# Patient Record
Sex: Female | Born: 1935 | Race: Black or African American | Hispanic: No | Marital: Single | Smoking: Former smoker
Health system: Southern US, Community
[De-identification: ages and names within clinical notes are randomized; demographics above are authoritative.]

## PROBLEM LIST (undated history)

## (undated) DIAGNOSIS — I10 Essential (primary) hypertension: Secondary | ICD-10-CM

## (undated) DIAGNOSIS — I714 Abdominal aortic aneurysm, without rupture: Secondary | ICD-10-CM

## (undated) DIAGNOSIS — E785 Hyperlipidemia, unspecified: Secondary | ICD-10-CM

## (undated) DIAGNOSIS — B029 Zoster without complications: Secondary | ICD-10-CM

## (undated) DIAGNOSIS — C20 Malignant neoplasm of rectum: Secondary | ICD-10-CM

## (undated) DIAGNOSIS — D649 Anemia, unspecified: Secondary | ICD-10-CM

## (undated) DIAGNOSIS — E039 Hypothyroidism, unspecified: Secondary | ICD-10-CM

## (undated) DIAGNOSIS — Z8739 Personal history of other diseases of the musculoskeletal system and connective tissue: Secondary | ICD-10-CM

## (undated) DIAGNOSIS — N281 Cyst of kidney, acquired: Secondary | ICD-10-CM

## (undated) DIAGNOSIS — C801 Malignant (primary) neoplasm, unspecified: Secondary | ICD-10-CM

## (undated) HISTORY — DX: Anemia, unspecified: D64.9

## (undated) HISTORY — DX: Zoster without complications: B02.9

## (undated) HISTORY — DX: Essential (primary) hypertension: I10

## (undated) HISTORY — DX: Abdominal aortic aneurysm, without rupture: I71.4

## (undated) HISTORY — PX: BUNIONECTOMY: SHX129

## (undated) HISTORY — DX: Hyperlipidemia, unspecified: E78.5

## (undated) HISTORY — PX: ABDOMINAL AORTIC ANEURYSM REPAIR: SUR1152

## (undated) HISTORY — DX: Malignant (primary) neoplasm, unspecified: C80.1

---
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
## 1959-04-05 HISTORY — PX: ABDOMINAL HYSTERECTOMY: SHX81

## 1997-08-26 ENCOUNTER — Other Ambulatory Visit: Admission: RE | Admit: 1997-08-26 | Discharge: 1997-08-26 | Payer: Self-pay | Admitting: Internal Medicine

## 1998-12-30 ENCOUNTER — Ambulatory Visit (HOSPITAL_COMMUNITY): Admission: RE | Admit: 1998-12-30 | Discharge: 1998-12-30 | Payer: Self-pay | Admitting: Internal Medicine

## 1998-12-30 ENCOUNTER — Encounter: Payer: Self-pay | Admitting: Internal Medicine

## 1999-03-31 ENCOUNTER — Encounter: Admission: RE | Admit: 1999-03-31 | Discharge: 1999-03-31 | Payer: Self-pay | Admitting: Internal Medicine

## 1999-03-31 ENCOUNTER — Encounter: Payer: Self-pay | Admitting: Internal Medicine

## 2000-04-14 ENCOUNTER — Encounter: Payer: Self-pay | Admitting: Internal Medicine

## 2000-04-14 ENCOUNTER — Encounter: Admission: RE | Admit: 2000-04-14 | Discharge: 2000-04-14 | Payer: Self-pay | Admitting: Internal Medicine

## 2001-04-18 ENCOUNTER — Encounter: Payer: Self-pay | Admitting: Internal Medicine

## 2001-04-18 ENCOUNTER — Encounter: Admission: RE | Admit: 2001-04-18 | Discharge: 2001-04-18 | Payer: Self-pay | Admitting: Internal Medicine

## 2001-07-05 ENCOUNTER — Ambulatory Visit (HOSPITAL_COMMUNITY): Admission: RE | Admit: 2001-07-05 | Discharge: 2001-07-05 | Payer: Self-pay | Admitting: Gastroenterology

## 2001-07-05 ENCOUNTER — Encounter (INDEPENDENT_AMBULATORY_CARE_PROVIDER_SITE_OTHER): Payer: Self-pay | Admitting: Specialist

## 2002-05-01 ENCOUNTER — Encounter: Payer: Self-pay | Admitting: Internal Medicine

## 2002-05-01 ENCOUNTER — Encounter: Admission: RE | Admit: 2002-05-01 | Discharge: 2002-05-01 | Payer: Self-pay | Admitting: Internal Medicine

## 2003-07-08 ENCOUNTER — Encounter: Admission: RE | Admit: 2003-07-08 | Discharge: 2003-07-08 | Payer: Self-pay | Admitting: Internal Medicine

## 2004-06-28 ENCOUNTER — Ambulatory Visit: Admission: RE | Admit: 2004-06-28 | Discharge: 2004-06-28 | Payer: Self-pay | Admitting: Gastroenterology

## 2004-06-28 ENCOUNTER — Encounter (INDEPENDENT_AMBULATORY_CARE_PROVIDER_SITE_OTHER): Payer: Self-pay | Admitting: *Deleted

## 2004-08-09 ENCOUNTER — Ambulatory Visit (HOSPITAL_COMMUNITY): Admission: RE | Admit: 2004-08-09 | Discharge: 2004-08-09 | Payer: Self-pay | Admitting: Diagnostic Radiology

## 2004-11-12 ENCOUNTER — Encounter: Admission: RE | Admit: 2004-11-12 | Discharge: 2004-11-12 | Payer: Self-pay | Admitting: Internal Medicine

## 2005-11-22 ENCOUNTER — Encounter: Admission: RE | Admit: 2005-11-22 | Discharge: 2005-11-22 | Payer: Self-pay | Admitting: Internal Medicine

## 2006-04-04 HISTORY — PX: OTHER SURGICAL HISTORY: SHX169

## 2006-05-29 ENCOUNTER — Encounter (INDEPENDENT_AMBULATORY_CARE_PROVIDER_SITE_OTHER): Payer: Self-pay | Admitting: *Deleted

## 2006-05-30 ENCOUNTER — Ambulatory Visit (HOSPITAL_COMMUNITY): Admission: RE | Admit: 2006-05-30 | Discharge: 2006-05-30 | Payer: Self-pay | Admitting: General Surgery

## 2006-06-20 ENCOUNTER — Ambulatory Visit: Payer: Self-pay | Admitting: Hematology and Oncology

## 2006-07-05 LAB — COMPREHENSIVE METABOLIC PANEL
Alkaline Phosphatase: 57 U/L (ref 39–117)
CO2: 22 mEq/L (ref 19–32)
Creatinine, Ser: 1.71 mg/dL — ABNORMAL HIGH (ref 0.40–1.20)
Glucose, Bld: 146 mg/dL — ABNORMAL HIGH (ref 70–99)
Sodium: 140 mEq/L (ref 135–145)
Total Bilirubin: 0.3 mg/dL (ref 0.3–1.2)
Total Protein: 7.2 g/dL (ref 6.0–8.3)

## 2006-07-05 LAB — CBC WITH DIFFERENTIAL/PLATELET
BASO%: 0.5 % (ref 0.0–2.0)
Eosinophils Absolute: 0.1 10*3/uL (ref 0.0–0.5)
HCT: 30.6 % — ABNORMAL LOW (ref 34.8–46.6)
HGB: 10.3 g/dL — ABNORMAL LOW (ref 11.6–15.9)
MCHC: 33.6 g/dL (ref 32.0–36.0)
MONO#: 0.5 10*3/uL (ref 0.1–0.9)
NEUT#: 3.9 10*3/uL (ref 1.5–6.5)
NEUT%: 67 % (ref 39.6–76.8)
WBC: 5.9 10*3/uL (ref 3.9–10.0)
lymph#: 1.2 10*3/uL (ref 0.9–3.3)

## 2006-07-06 ENCOUNTER — Ambulatory Visit (HOSPITAL_COMMUNITY): Admission: RE | Admit: 2006-07-06 | Discharge: 2006-07-06 | Payer: Self-pay | Admitting: Hematology and Oncology

## 2006-07-10 ENCOUNTER — Ambulatory Visit: Admission: RE | Admit: 2006-07-10 | Discharge: 2006-09-15 | Payer: Self-pay | Admitting: Radiation Oncology

## 2006-07-13 ENCOUNTER — Ambulatory Visit (HOSPITAL_COMMUNITY): Admission: RE | Admit: 2006-07-13 | Discharge: 2006-07-13 | Payer: Self-pay | Admitting: Hematology and Oncology

## 2006-07-19 LAB — COMPREHENSIVE METABOLIC PANEL
Alkaline Phosphatase: 60 U/L (ref 39–117)
BUN: 23 mg/dL (ref 6–23)
CO2: 24 mEq/L (ref 19–32)
Creatinine, Ser: 1.56 mg/dL — ABNORMAL HIGH (ref 0.40–1.20)
Glucose, Bld: 113 mg/dL — ABNORMAL HIGH (ref 70–99)
Sodium: 138 mEq/L (ref 135–145)
Total Bilirubin: 0.4 mg/dL (ref 0.3–1.2)
Total Protein: 7.4 g/dL (ref 6.0–8.3)

## 2006-07-19 LAB — CBC WITH DIFFERENTIAL/PLATELET
Basophils Absolute: 0 10*3/uL (ref 0.0–0.1)
Eosinophils Absolute: 0.1 10*3/uL (ref 0.0–0.5)
HCT: 31.1 % — ABNORMAL LOW (ref 34.8–46.6)
HGB: 10.4 g/dL — ABNORMAL LOW (ref 11.6–15.9)
LYMPH%: 12.7 % — ABNORMAL LOW (ref 14.0–48.0)
MCV: 77.7 fL — ABNORMAL LOW (ref 81.0–101.0)
MONO#: 0.8 10*3/uL (ref 0.1–0.9)
MONO%: 7.7 % (ref 0.0–13.0)
NEUT#: 8.6 10*3/uL — ABNORMAL HIGH (ref 1.5–6.5)
NEUT%: 78.3 % — ABNORMAL HIGH (ref 39.6–76.8)
Platelets: 516 10*3/uL — ABNORMAL HIGH (ref 145–400)
RBC: 4 10*6/uL (ref 3.70–5.32)
WBC: 11 10*3/uL — ABNORMAL HIGH (ref 3.9–10.0)

## 2006-07-20 ENCOUNTER — Ambulatory Visit (HOSPITAL_COMMUNITY): Admission: RE | Admit: 2006-07-20 | Discharge: 2006-07-20 | Payer: Self-pay | Admitting: Gastroenterology

## 2006-07-25 ENCOUNTER — Ambulatory Visit: Payer: Self-pay | Admitting: Gastroenterology

## 2006-07-25 LAB — CBC WITH DIFFERENTIAL/PLATELET
Basophils Absolute: 0 10*3/uL (ref 0.0–0.1)
Eosinophils Absolute: 0.1 10*3/uL (ref 0.0–0.5)
HGB: 9.9 g/dL — ABNORMAL LOW (ref 11.6–15.9)
LYMPH%: 17.8 % (ref 14.0–48.0)
MCV: 77.5 fL — ABNORMAL LOW (ref 81.0–101.0)
MONO#: 0.4 10*3/uL (ref 0.1–0.9)
NEUT#: 3.9 10*3/uL (ref 1.5–6.5)
Platelets: 601 10*3/uL — ABNORMAL HIGH (ref 145–400)
RBC: 3.8 10*6/uL (ref 3.70–5.32)
WBC: 5.3 10*3/uL (ref 3.9–10.0)

## 2006-08-01 ENCOUNTER — Ambulatory Visit: Payer: Self-pay | Admitting: Hematology and Oncology

## 2006-08-02 ENCOUNTER — Encounter: Admission: RE | Admit: 2006-08-02 | Discharge: 2006-08-02 | Payer: Self-pay | Admitting: Interventional Radiology

## 2006-08-03 LAB — CBC WITH DIFFERENTIAL/PLATELET
Eosinophils Absolute: 0.2 10*3/uL (ref 0.0–0.5)
HCT: 28.5 % — ABNORMAL LOW (ref 34.8–46.6)
LYMPH%: 11.5 % — ABNORMAL LOW (ref 14.0–48.0)
MCV: 78.1 fL — ABNORMAL LOW (ref 81.0–101.0)
MONO#: 0.4 10*3/uL (ref 0.1–0.9)
MONO%: 9.5 % (ref 0.0–13.0)
NEUT#: 2.9 10*3/uL (ref 1.5–6.5)
NEUT%: 74.4 % (ref 39.6–76.8)
Platelets: 432 10*3/uL — ABNORMAL HIGH (ref 145–400)
WBC: 3.9 10*3/uL (ref 3.9–10.0)

## 2006-08-07 LAB — CBC WITH DIFFERENTIAL/PLATELET
BASO%: 0.1 % (ref 0.0–2.0)
EOS%: 5.6 % (ref 0.0–7.0)
LYMPH%: 8.7 % — ABNORMAL LOW (ref 14.0–48.0)
MCH: 27 pg (ref 26.0–34.0)
MCHC: 34.2 g/dL (ref 32.0–36.0)
MCV: 79.1 fL — ABNORMAL LOW (ref 81.0–101.0)
MONO%: 8 % (ref 0.0–13.0)
NEUT#: 3.5 10*3/uL (ref 1.5–6.5)
Platelets: 347 10*3/uL (ref 145–400)
RBC: 3.45 10*6/uL — ABNORMAL LOW (ref 3.70–5.32)
RDW: 16.2 % — ABNORMAL HIGH (ref 11.3–14.5)

## 2006-08-07 LAB — COMPREHENSIVE METABOLIC PANEL
AST: 23 U/L (ref 0–37)
Albumin: 3.6 g/dL (ref 3.5–5.2)
Alkaline Phosphatase: 45 U/L (ref 39–117)
Potassium: 4 mEq/L (ref 3.5–5.3)
Sodium: 138 mEq/L (ref 135–145)
Total Bilirubin: 0.3 mg/dL (ref 0.3–1.2)
Total Protein: 6.5 g/dL (ref 6.0–8.3)

## 2006-08-07 LAB — IRON AND TIBC
%SAT: 15 % — ABNORMAL LOW (ref 20–55)
Iron: 59 ug/dL (ref 42–145)

## 2006-08-07 LAB — FERRITIN: Ferritin: 18 ng/mL (ref 10–291)

## 2006-08-14 LAB — CBC WITH DIFFERENTIAL/PLATELET
BASO%: 0 % (ref 0.0–2.0)
EOS%: 4.5 % (ref 0.0–7.0)
HCT: 30.3 % — ABNORMAL LOW (ref 34.8–46.6)
HGB: 10.1 g/dL — ABNORMAL LOW (ref 11.6–15.9)
MCH: 27.1 pg (ref 26.0–34.0)
MCHC: 33.3 g/dL (ref 32.0–36.0)
MONO#: 0.6 10*3/uL (ref 0.1–0.9)
NEUT%: 84 % — ABNORMAL HIGH (ref 39.6–76.8)
RDW: 17.3 % — ABNORMAL HIGH (ref 11.3–14.5)
WBC: 8.6 10*3/uL (ref 3.9–10.0)
lymph#: 0.4 10*3/uL — ABNORMAL LOW (ref 0.9–3.3)

## 2006-08-14 LAB — COMPREHENSIVE METABOLIC PANEL
AST: 16 U/L (ref 0–37)
Albumin: 3.9 g/dL (ref 3.5–5.2)
Alkaline Phosphatase: 49 U/L (ref 39–117)
BUN: 20 mg/dL (ref 6–23)
Potassium: 3.9 mEq/L (ref 3.5–5.3)
Sodium: 136 mEq/L (ref 135–145)
Total Bilirubin: 0.4 mg/dL (ref 0.3–1.2)
Total Protein: 6.8 g/dL (ref 6.0–8.3)

## 2006-08-21 LAB — CBC WITH DIFFERENTIAL/PLATELET
Eosinophils Absolute: 0.4 10*3/uL (ref 0.0–0.5)
HCT: 31.4 % — ABNORMAL LOW (ref 34.8–46.6)
LYMPH%: 6.7 % — ABNORMAL LOW (ref 14.0–48.0)
MCV: 83.8 fL (ref 81.0–101.0)
MONO%: 9.1 % (ref 0.0–13.0)
NEUT#: 3.9 10*3/uL (ref 1.5–6.5)
NEUT%: 75 % (ref 39.6–76.8)
Platelets: 293 10*3/uL (ref 145–400)
RBC: 3.74 10*6/uL (ref 3.70–5.32)

## 2006-08-21 LAB — COMPREHENSIVE METABOLIC PANEL
Alkaline Phosphatase: 52 U/L (ref 39–117)
BUN: 22 mg/dL (ref 6–23)
CO2: 23 mEq/L (ref 19–32)
Creatinine, Ser: 1.69 mg/dL — ABNORMAL HIGH (ref 0.40–1.20)
Glucose, Bld: 123 mg/dL — ABNORMAL HIGH (ref 70–99)
Sodium: 138 mEq/L (ref 135–145)
Total Bilirubin: 0.4 mg/dL (ref 0.3–1.2)
Total Protein: 6.3 g/dL (ref 6.0–8.3)

## 2006-08-31 LAB — CBC WITH DIFFERENTIAL/PLATELET
Basophils Absolute: 0 10*3/uL (ref 0.0–0.1)
Eosinophils Absolute: 0.1 10*3/uL (ref 0.0–0.5)
HCT: 28.7 % — ABNORMAL LOW (ref 34.8–46.6)
HGB: 10 g/dL — ABNORMAL LOW (ref 11.6–15.9)
LYMPH%: 8.7 % — ABNORMAL LOW (ref 14.0–48.0)
MCV: 84.1 fL (ref 81.0–101.0)
MONO#: 0.5 10*3/uL (ref 0.1–0.9)
MONO%: 12.1 % (ref 0.0–13.0)
NEUT#: 2.9 10*3/uL (ref 1.5–6.5)
NEUT%: 76.2 % (ref 39.6–76.8)
Platelets: 379 10*3/uL (ref 145–400)
WBC: 3.8 10*3/uL — ABNORMAL LOW (ref 3.9–10.0)

## 2006-09-06 LAB — CBC WITH DIFFERENTIAL/PLATELET
BASO%: 0.2 % (ref 0.0–2.0)
EOS%: 3.7 % (ref 0.0–7.0)
HCT: 30.5 % — ABNORMAL LOW (ref 34.8–46.6)
LYMPH%: 9.3 % — ABNORMAL LOW (ref 14.0–48.0)
MCH: 29.8 pg (ref 26.0–34.0)
MCHC: 34.8 g/dL (ref 32.0–36.0)
MONO%: 14 % — ABNORMAL HIGH (ref 0.0–13.0)
NEUT%: 72.8 % (ref 39.6–76.8)
Platelets: 339 10*3/uL (ref 145–400)
RBC: 3.57 10*6/uL — ABNORMAL LOW (ref 3.70–5.32)

## 2006-09-06 LAB — COMPREHENSIVE METABOLIC PANEL
ALT: 12 U/L (ref 0–35)
AST: 20 U/L (ref 0–37)
Alkaline Phosphatase: 55 U/L (ref 39–117)
Creatinine, Ser: 1.6 mg/dL — ABNORMAL HIGH (ref 0.40–1.20)
Sodium: 140 mEq/L (ref 135–145)
Total Bilirubin: 0.4 mg/dL (ref 0.3–1.2)

## 2006-09-06 LAB — LACTATE DEHYDROGENASE: LDH: 292 U/L — ABNORMAL HIGH (ref 94–250)

## 2006-10-24 ENCOUNTER — Ambulatory Visit: Payer: Self-pay | Admitting: Hematology and Oncology

## 2006-10-26 LAB — COMPREHENSIVE METABOLIC PANEL
Albumin: 4 g/dL (ref 3.5–5.2)
Alkaline Phosphatase: 54 U/L (ref 39–117)
BUN: 24 mg/dL — ABNORMAL HIGH (ref 6–23)
Calcium: 8.8 mg/dL (ref 8.4–10.5)
Chloride: 104 mEq/L (ref 96–112)
Glucose, Bld: 84 mg/dL (ref 70–99)
Potassium: 4.5 mEq/L (ref 3.5–5.3)

## 2006-10-26 LAB — CBC WITH DIFFERENTIAL/PLATELET
Basophils Absolute: 0 10*3/uL (ref 0.0–0.1)
Eosinophils Absolute: 0.2 10*3/uL (ref 0.0–0.5)
HCT: 37.2 % (ref 34.8–46.6)
HGB: 12.9 g/dL (ref 11.6–15.9)
MCV: 89.5 fL (ref 81.0–101.0)
MONO%: 10.6 % (ref 0.0–13.0)
NEUT#: 4.4 10*3/uL (ref 1.5–6.5)
NEUT%: 72.2 % (ref 39.6–76.8)
RDW: 21.8 % — ABNORMAL HIGH (ref 11.3–14.5)

## 2006-10-26 LAB — CEA: CEA: 2.9 ng/mL (ref 0.0–5.0)

## 2006-10-26 LAB — LACTATE DEHYDROGENASE: LDH: 248 U/L (ref 94–250)

## 2006-11-06 ENCOUNTER — Ambulatory Visit (HOSPITAL_COMMUNITY): Admission: RE | Admit: 2006-11-06 | Discharge: 2006-11-06 | Payer: Self-pay | Admitting: Oncology

## 2006-11-27 ENCOUNTER — Encounter: Admission: RE | Admit: 2006-11-27 | Discharge: 2006-11-27 | Payer: Self-pay | Admitting: Hematology and Oncology

## 2006-12-08 ENCOUNTER — Ambulatory Visit: Payer: Self-pay | Admitting: Hematology and Oncology

## 2006-12-12 LAB — COMPREHENSIVE METABOLIC PANEL
AST: 22 U/L (ref 0–37)
Albumin: 4.1 g/dL (ref 3.5–5.2)
BUN: 26 mg/dL — ABNORMAL HIGH (ref 6–23)
Calcium: 8.8 mg/dL (ref 8.4–10.5)
Chloride: 102 mEq/L (ref 96–112)
Potassium: 4.1 mEq/L (ref 3.5–5.3)
Sodium: 139 mEq/L (ref 135–145)
Total Protein: 6.9 g/dL (ref 6.0–8.3)

## 2006-12-12 LAB — CBC WITH DIFFERENTIAL/PLATELET
Basophils Absolute: 0 10*3/uL (ref 0.0–0.1)
EOS%: 3 % (ref 0.0–7.0)
Eosinophils Absolute: 0.2 10*3/uL (ref 0.0–0.5)
HGB: 12.4 g/dL (ref 11.6–15.9)
NEUT#: 3.9 10*3/uL (ref 1.5–6.5)
RBC: 3.86 10*6/uL (ref 3.70–5.32)
RDW: 15.7 % — ABNORMAL HIGH (ref 11.3–14.5)
lymph#: 0.7 10*3/uL — ABNORMAL LOW (ref 0.9–3.3)

## 2007-01-03 LAB — CBC WITH DIFFERENTIAL/PLATELET
BASO%: 0.4 % (ref 0.0–2.0)
Basophils Absolute: 0 10*3/uL (ref 0.0–0.1)
EOS%: 4.9 % (ref 0.0–7.0)
HGB: 12.2 g/dL (ref 11.6–15.9)
MCH: 32.5 pg (ref 26.0–34.0)
MCHC: 35.6 g/dL (ref 32.0–36.0)
MCV: 91.1 fL (ref 81.0–101.0)
MONO%: 9.5 % (ref 0.0–13.0)
RBC: 3.76 10*6/uL (ref 3.70–5.32)
RDW: 17.6 % — ABNORMAL HIGH (ref 11.3–14.5)
lymph#: 0.6 10*3/uL — ABNORMAL LOW (ref 0.9–3.3)

## 2007-01-03 LAB — BASIC METABOLIC PANEL
CO2: 21 mEq/L (ref 19–32)
Chloride: 102 mEq/L (ref 96–112)
Creatinine, Ser: 1.76 mg/dL — ABNORMAL HIGH (ref 0.40–1.20)
Potassium: 3.7 mEq/L (ref 3.5–5.3)
Sodium: 137 mEq/L (ref 135–145)

## 2007-01-22 ENCOUNTER — Ambulatory Visit: Payer: Self-pay | Admitting: Hematology and Oncology

## 2007-01-24 LAB — CBC WITH DIFFERENTIAL/PLATELET
Basophils Absolute: 0 10*3/uL (ref 0.0–0.1)
EOS%: 4.5 % (ref 0.0–7.0)
Eosinophils Absolute: 0.2 10*3/uL (ref 0.0–0.5)
HCT: 31.9 % — ABNORMAL LOW (ref 34.8–46.6)
HGB: 11.4 g/dL — ABNORMAL LOW (ref 11.6–15.9)
MCH: 32.7 pg (ref 26.0–34.0)
MCV: 91.9 fL (ref 81.0–101.0)
MONO%: 14.1 % — ABNORMAL HIGH (ref 0.0–13.0)
NEUT#: 4 10*3/uL (ref 1.5–6.5)
NEUT%: 73.1 % (ref 39.6–76.8)
RDW: 21.2 % — ABNORMAL HIGH (ref 11.3–14.5)

## 2007-01-24 LAB — BASIC METABOLIC PANEL
BUN: 21 mg/dL (ref 6–23)
Chloride: 104 mEq/L (ref 96–112)
Creatinine, Ser: 1.7 mg/dL — ABNORMAL HIGH (ref 0.40–1.20)
Glucose, Bld: 129 mg/dL — ABNORMAL HIGH (ref 70–99)
Potassium: 3.4 mEq/L — ABNORMAL LOW (ref 3.5–5.3)

## 2007-02-07 LAB — CBC WITH DIFFERENTIAL/PLATELET
BASO%: 0.7 % (ref 0.0–2.0)
EOS%: 3.4 % (ref 0.0–7.0)
LYMPH%: 18.5 % (ref 14.0–48.0)
MCHC: 35.4 g/dL (ref 32.0–36.0)
MCV: 94.3 fL (ref 81.0–101.0)
MONO%: 13.7 % — ABNORMAL HIGH (ref 0.0–13.0)
Platelets: 356 10*3/uL (ref 145–400)
RBC: 3.43 10*6/uL — ABNORMAL LOW (ref 3.70–5.32)
RDW: 20.4 % — ABNORMAL HIGH (ref 11.3–14.5)
WBC: 3.8 10*3/uL — ABNORMAL LOW (ref 3.9–10.0)

## 2007-02-07 LAB — COMPREHENSIVE METABOLIC PANEL
ALT: 14 U/L (ref 0–35)
AST: 21 U/L (ref 0–37)
Alkaline Phosphatase: 62 U/L (ref 39–117)
Potassium: 4.3 mEq/L (ref 3.5–5.3)
Sodium: 139 mEq/L (ref 135–145)
Total Bilirubin: 0.6 mg/dL (ref 0.3–1.2)
Total Protein: 6.5 g/dL (ref 6.0–8.3)

## 2007-03-06 ENCOUNTER — Ambulatory Visit: Payer: Self-pay | Admitting: Hematology and Oncology

## 2007-03-06 LAB — CBC WITH DIFFERENTIAL/PLATELET
EOS%: 3.6 % (ref 0.0–7.0)
Eosinophils Absolute: 0.2 10*3/uL (ref 0.0–0.5)
LYMPH%: 11.8 % — ABNORMAL LOW (ref 14.0–48.0)
MCH: 33.5 pg (ref 26.0–34.0)
MCHC: 36.1 g/dL — ABNORMAL HIGH (ref 32.0–36.0)
MCV: 92.9 fL (ref 81.0–101.0)
MONO%: 9.1 % (ref 0.0–13.0)
NEUT#: 4.1 10*3/uL (ref 1.5–6.5)
Platelets: 398 10*3/uL (ref 145–400)
RBC: 3.47 10*6/uL — ABNORMAL LOW (ref 3.70–5.32)

## 2007-03-06 LAB — CEA: CEA: 2.3 ng/mL (ref 0.0–5.0)

## 2007-03-06 LAB — COMPREHENSIVE METABOLIC PANEL
ALT: 15 U/L (ref 0–35)
BUN: 32 mg/dL — ABNORMAL HIGH (ref 6–23)
CO2: 22 mEq/L (ref 19–32)
Creatinine, Ser: 1.64 mg/dL — ABNORMAL HIGH (ref 0.40–1.20)
Total Bilirubin: 0.4 mg/dL (ref 0.3–1.2)

## 2007-03-13 ENCOUNTER — Ambulatory Visit (HOSPITAL_COMMUNITY): Admission: RE | Admit: 2007-03-13 | Discharge: 2007-03-13 | Payer: Self-pay | Admitting: Hematology and Oncology

## 2007-05-24 ENCOUNTER — Ambulatory Visit: Payer: Self-pay | Admitting: Hematology and Oncology

## 2007-07-10 ENCOUNTER — Ambulatory Visit: Payer: Self-pay | Admitting: Hematology and Oncology

## 2007-07-12 LAB — CBC WITH DIFFERENTIAL/PLATELET
BASO%: 0.3 % (ref 0.0–2.0)
EOS%: 0.9 % (ref 0.0–7.0)
Eosinophils Absolute: 0.1 10*3/uL (ref 0.0–0.5)
LYMPH%: 13.6 % — ABNORMAL LOW (ref 14.0–48.0)
MCH: 31.1 pg (ref 26.0–34.0)
MCHC: 34.8 g/dL (ref 32.0–36.0)
MCV: 89.2 fL (ref 81.0–101.0)
MONO%: 10.6 % (ref 0.0–13.0)
Platelets: 441 10*3/uL — ABNORMAL HIGH (ref 145–400)
RBC: 4.16 10*6/uL (ref 3.70–5.32)

## 2007-07-12 LAB — COMPREHENSIVE METABOLIC PANEL
ALT: 15 U/L (ref 0–35)
AST: 21 U/L (ref 0–37)
Alkaline Phosphatase: 42 U/L (ref 39–117)
Creatinine, Ser: 1.85 mg/dL — ABNORMAL HIGH (ref 0.40–1.20)
Sodium: 138 mEq/L (ref 135–145)
Total Bilirubin: 0.4 mg/dL (ref 0.3–1.2)
Total Protein: 7.1 g/dL (ref 6.0–8.3)

## 2007-07-16 ENCOUNTER — Ambulatory Visit (HOSPITAL_COMMUNITY): Admission: RE | Admit: 2007-07-16 | Discharge: 2007-07-16 | Payer: Self-pay | Admitting: Hematology and Oncology

## 2007-07-23 LAB — PROTEIN ELECTROPHORESIS, SERUM, WITH REFLEX
Albumin ELP: 54.7 % — ABNORMAL LOW (ref 55.8–66.1)
Alpha-1-Globulin: 5.3 % — ABNORMAL HIGH (ref 2.9–4.9)
Alpha-2-Globulin: 13.8 % — ABNORMAL HIGH (ref 7.1–11.8)
Beta 2: 5.2 % (ref 3.2–6.5)
Beta Globulin: 6.4 % (ref 4.7–7.2)
Gamma Globulin: 14.6 % (ref 11.1–18.8)

## 2007-07-23 LAB — IGG, IGA, IGM: IgM, Serum: 364 mg/dL — ABNORMAL HIGH (ref 60–263)

## 2007-07-24 ENCOUNTER — Ambulatory Visit (HOSPITAL_COMMUNITY): Admission: RE | Admit: 2007-07-24 | Discharge: 2007-07-24 | Payer: Self-pay | Admitting: Hematology and Oncology

## 2007-07-30 ENCOUNTER — Ambulatory Visit: Payer: Self-pay | Admitting: Vascular Surgery

## 2007-08-09 ENCOUNTER — Ambulatory Visit: Payer: Self-pay | Admitting: Cardiothoracic Surgery

## 2007-08-29 ENCOUNTER — Ambulatory Visit: Payer: Self-pay | Admitting: Hematology and Oncology

## 2007-10-11 ENCOUNTER — Ambulatory Visit (HOSPITAL_COMMUNITY): Admission: RE | Admit: 2007-10-11 | Discharge: 2007-10-11 | Payer: Self-pay | Admitting: Hematology and Oncology

## 2007-10-11 LAB — COMPREHENSIVE METABOLIC PANEL
AST: 27 U/L (ref 0–37)
Albumin: 3.7 g/dL (ref 3.5–5.2)
Alkaline Phosphatase: 50 U/L (ref 39–117)
Calcium: 9.1 mg/dL (ref 8.4–10.5)
Chloride: 103 mEq/L (ref 96–112)
Potassium: 4.3 mEq/L (ref 3.5–5.3)
Sodium: 138 mEq/L (ref 135–145)
Total Protein: 7.1 g/dL (ref 6.0–8.3)

## 2007-10-11 LAB — CBC WITH DIFFERENTIAL/PLATELET
EOS%: 3.7 % (ref 0.0–7.0)
Eosinophils Absolute: 0.2 10*3/uL (ref 0.0–0.5)
HGB: 12 g/dL (ref 11.6–15.9)
MCH: 31 pg (ref 26.0–34.0)
MCV: 89.3 fL (ref 81.0–101.0)
MONO%: 11.1 % (ref 0.0–13.0)
NEUT#: 3.3 10*3/uL (ref 1.5–6.5)
RBC: 3.88 10*6/uL (ref 3.70–5.32)
RDW: 14.4 % (ref 11.3–14.5)
lymph#: 0.7 10*3/uL — ABNORMAL LOW (ref 0.9–3.3)

## 2007-10-16 ENCOUNTER — Ambulatory Visit: Payer: Self-pay | Admitting: Hematology and Oncology

## 2007-11-27 ENCOUNTER — Ambulatory Visit: Payer: Self-pay | Admitting: Hematology and Oncology

## 2007-12-17 ENCOUNTER — Encounter: Admission: RE | Admit: 2007-12-17 | Discharge: 2007-12-17 | Payer: Self-pay | Admitting: Internal Medicine

## 2008-01-09 LAB — CBC WITH DIFFERENTIAL/PLATELET
Basophils Absolute: 0 10*3/uL (ref 0.0–0.1)
HCT: 33.7 % — ABNORMAL LOW (ref 34.8–46.6)
HGB: 11.6 g/dL (ref 11.6–15.9)
MCH: 30.1 pg (ref 26.0–34.0)
MONO#: 0.4 10*3/uL (ref 0.1–0.9)
NEUT%: 77.5 % — ABNORMAL HIGH (ref 39.6–76.8)
WBC: 4.7 10*3/uL (ref 3.9–10.0)
lymph#: 0.5 10*3/uL — ABNORMAL LOW (ref 0.9–3.3)

## 2008-01-09 LAB — COMPREHENSIVE METABOLIC PANEL
BUN: 21 mg/dL (ref 6–23)
CO2: 20 mEq/L (ref 19–32)
Calcium: 9.3 mg/dL (ref 8.4–10.5)
Chloride: 104 mEq/L (ref 96–112)
Creatinine, Ser: 1.75 mg/dL — ABNORMAL HIGH (ref 0.40–1.20)
Glucose, Bld: 154 mg/dL — ABNORMAL HIGH (ref 70–99)

## 2008-01-09 LAB — CEA: CEA: 1.9 ng/mL (ref 0.0–5.0)

## 2008-01-11 ENCOUNTER — Ambulatory Visit (HOSPITAL_COMMUNITY): Admission: RE | Admit: 2008-01-11 | Discharge: 2008-01-11 | Payer: Self-pay | Admitting: Hematology and Oncology

## 2008-01-14 ENCOUNTER — Ambulatory Visit: Payer: Self-pay | Admitting: Hematology and Oncology

## 2008-01-29 ENCOUNTER — Ambulatory Visit: Payer: Self-pay | Admitting: Vascular Surgery

## 2008-03-03 ENCOUNTER — Ambulatory Visit: Payer: Self-pay | Admitting: Hematology and Oncology

## 2008-05-16 ENCOUNTER — Ambulatory Visit: Payer: Self-pay | Admitting: Hematology and Oncology

## 2008-05-20 LAB — CBC WITH DIFFERENTIAL/PLATELET
BASO%: 0.4 % (ref 0.0–2.0)
Eosinophils Absolute: 0.2 10*3/uL (ref 0.0–0.5)
LYMPH%: 14.1 % (ref 14.0–48.0)
MCHC: 33.4 g/dL (ref 32.0–36.0)
MONO#: 0.5 10*3/uL (ref 0.1–0.9)
MONO%: 10.1 % (ref 0.0–13.0)
NEUT#: 3.6 10*3/uL (ref 1.5–6.5)
Platelets: 565 10*3/uL — ABNORMAL HIGH (ref 145–400)
RBC: 3.28 10*6/uL — ABNORMAL LOW (ref 3.70–5.32)
RDW: 16.7 % — ABNORMAL HIGH (ref 11.3–14.5)
WBC: 5.1 10*3/uL (ref 3.9–10.0)

## 2008-05-20 LAB — COMPREHENSIVE METABOLIC PANEL
ALT: 12 U/L (ref 0–35)
AST: 18 U/L (ref 0–37)
Calcium: 10 mg/dL (ref 8.4–10.5)
Chloride: 98 mEq/L (ref 96–112)
Creatinine, Ser: 2.08 mg/dL — ABNORMAL HIGH (ref 0.40–1.20)
Total Bilirubin: 0.2 mg/dL — ABNORMAL LOW (ref 0.3–1.2)

## 2008-05-26 LAB — IGG, IGA, IGM: IgM, Serum: 341 mg/dL — ABNORMAL HIGH (ref 60–263)

## 2008-05-26 LAB — IRON AND TIBC
TIBC: 361 ug/dL (ref 250–470)
UIBC: 327 ug/dL

## 2008-05-26 LAB — PROTEIN ELECTROPHORESIS, SERUM, WITH REFLEX
Albumin ELP: 51.5 % — ABNORMAL LOW (ref 55.8–66.1)
Alpha-1-Globulin: 6.1 % — ABNORMAL HIGH (ref 2.9–4.9)
Gamma Globulin: 16.9 % (ref 11.1–18.8)
Total Protein, Serum Electrophoresis: 7.6 g/dL (ref 6.0–8.3)

## 2008-05-26 LAB — IFE INTERPRETATION

## 2008-05-26 LAB — FERRITIN: Ferritin: 24 ng/mL (ref 10–291)

## 2008-05-29 LAB — UIFE/LIGHT CHAINS/TP QN, 24-HR UR
Alpha 2, Urine: DETECTED — AB
Beta, Urine: DETECTED — AB
Free Lt Chn Excr Rate: 235.6 mg/d
Total Protein, Urine-Ur/day: 308 mg/d — ABNORMAL HIGH (ref 10–140)

## 2008-07-15 ENCOUNTER — Ambulatory Visit: Payer: Self-pay | Admitting: Hematology and Oncology

## 2008-09-09 ENCOUNTER — Ambulatory Visit: Payer: Self-pay | Admitting: Hematology and Oncology

## 2008-09-11 ENCOUNTER — Ambulatory Visit (HOSPITAL_COMMUNITY): Admission: RE | Admit: 2008-09-11 | Discharge: 2008-09-11 | Payer: Self-pay | Admitting: Hematology and Oncology

## 2008-09-11 LAB — COMPREHENSIVE METABOLIC PANEL
Albumin: 3.4 g/dL — ABNORMAL LOW (ref 3.5–5.2)
BUN: 22 mg/dL (ref 6–23)
CO2: 24 mEq/L (ref 19–32)
Calcium: 9 mg/dL (ref 8.4–10.5)
Chloride: 100 mEq/L (ref 96–112)
Glucose, Bld: 136 mg/dL — ABNORMAL HIGH (ref 70–99)
Potassium: 4.5 mEq/L (ref 3.5–5.3)
Sodium: 134 mEq/L — ABNORMAL LOW (ref 135–145)
Total Protein: 7.1 g/dL (ref 6.0–8.3)

## 2008-09-11 LAB — CBC WITH DIFFERENTIAL/PLATELET
Basophils Absolute: 0 10*3/uL (ref 0.0–0.1)
Eosinophils Absolute: 0.4 10*3/uL (ref 0.0–0.5)
HGB: 10.3 g/dL — ABNORMAL LOW (ref 11.6–15.9)
MCV: 86.4 fL (ref 79.5–101.0)
MONO#: 0.5 10*3/uL (ref 0.1–0.9)
NEUT#: 3 10*3/uL (ref 1.5–6.5)
RBC: 3.68 10*6/uL — ABNORMAL LOW (ref 3.70–5.45)
RDW: 16.8 % — ABNORMAL HIGH (ref 11.2–14.5)
WBC: 4.7 10*3/uL (ref 3.9–10.3)
lymph#: 0.8 10*3/uL — ABNORMAL LOW (ref 0.9–3.3)

## 2008-09-11 LAB — CEA: CEA: 2.1 ng/mL (ref 0.0–5.0)

## 2008-09-24 ENCOUNTER — Ambulatory Visit (HOSPITAL_COMMUNITY): Admission: RE | Admit: 2008-09-24 | Discharge: 2008-09-24 | Payer: Self-pay | Admitting: Hematology and Oncology

## 2008-10-28 ENCOUNTER — Ambulatory Visit: Payer: Self-pay | Admitting: Vascular Surgery

## 2008-11-24 ENCOUNTER — Ambulatory Visit (HOSPITAL_COMMUNITY): Admission: RE | Admit: 2008-11-24 | Discharge: 2008-11-24 | Payer: Self-pay | Admitting: Internal Medicine

## 2008-12-22 ENCOUNTER — Encounter: Admission: RE | Admit: 2008-12-22 | Discharge: 2008-12-22 | Payer: Self-pay | Admitting: Internal Medicine

## 2009-01-15 ENCOUNTER — Ambulatory Visit: Payer: Self-pay | Admitting: Hematology and Oncology

## 2009-01-19 LAB — CBC WITH DIFFERENTIAL/PLATELET
BASO%: 0.4 % (ref 0.0–2.0)
EOS%: 2.7 % (ref 0.0–7.0)
MCHC: 31.6 g/dL (ref 31.5–36.0)
MONO#: 0.5 10*3/uL (ref 0.1–0.9)
RBC: 3.41 10*6/uL — ABNORMAL LOW (ref 3.70–5.45)
RDW: 14.9 % — ABNORMAL HIGH (ref 11.2–14.5)
WBC: 7 10*3/uL (ref 3.9–10.3)
lymph#: 1 10*3/uL (ref 0.9–3.3)

## 2009-01-19 LAB — COMPREHENSIVE METABOLIC PANEL
AST: 25 U/L (ref 0–37)
BUN: 22 mg/dL (ref 6–23)
Calcium: 8.9 mg/dL (ref 8.4–10.5)
Chloride: 101 mEq/L (ref 96–112)
Creatinine, Ser: 1.79 mg/dL — ABNORMAL HIGH (ref 0.40–1.20)
Glucose, Bld: 154 mg/dL — ABNORMAL HIGH (ref 70–99)

## 2009-04-04 DIAGNOSIS — I714 Abdominal aortic aneurysm, without rupture, unspecified: Secondary | ICD-10-CM

## 2009-04-04 HISTORY — DX: Abdominal aortic aneurysm, without rupture, unspecified: I71.40

## 2009-04-04 HISTORY — DX: Abdominal aortic aneurysm, without rupture: I71.4

## 2009-04-04 HISTORY — PX: OTHER SURGICAL HISTORY: SHX169

## 2009-05-15 ENCOUNTER — Ambulatory Visit: Payer: Self-pay | Admitting: Hematology and Oncology

## 2009-05-19 ENCOUNTER — Ambulatory Visit (HOSPITAL_COMMUNITY): Admission: RE | Admit: 2009-05-19 | Discharge: 2009-05-19 | Payer: Self-pay | Admitting: Hematology and Oncology

## 2009-05-19 LAB — CBC WITH DIFFERENTIAL/PLATELET
BASO%: 0.2 % (ref 0.0–2.0)
EOS%: 3.6 % (ref 0.0–7.0)
HCT: 29 % — ABNORMAL LOW (ref 34.8–46.6)
LYMPH%: 10.6 % — ABNORMAL LOW (ref 14.0–49.7)
MCH: 28.9 pg (ref 25.1–34.0)
MCHC: 33 g/dL (ref 31.5–36.0)
MCV: 87.4 fL (ref 79.5–101.0)
MONO%: 8.4 % (ref 0.0–14.0)
NEUT%: 77.2 % — ABNORMAL HIGH (ref 38.4–76.8)
lymph#: 0.7 10*3/uL — ABNORMAL LOW (ref 0.9–3.3)

## 2009-05-19 LAB — COMPREHENSIVE METABOLIC PANEL
ALT: 15 U/L (ref 0–35)
AST: 25 U/L (ref 0–37)
Alkaline Phosphatase: 57 U/L (ref 39–117)
BUN: 27 mg/dL — ABNORMAL HIGH (ref 6–23)
Chloride: 102 mEq/L (ref 96–112)
Creatinine, Ser: 1.94 mg/dL — ABNORMAL HIGH (ref 0.40–1.20)
Total Bilirubin: 0.5 mg/dL (ref 0.3–1.2)

## 2009-05-23 LAB — IRON AND TIBC: %SAT: 7 % — ABNORMAL LOW (ref 20–55)

## 2009-06-17 ENCOUNTER — Ambulatory Visit: Payer: Self-pay | Admitting: Hematology and Oncology

## 2009-06-19 LAB — CBC WITH DIFFERENTIAL/PLATELET
BASO%: 0.3 % (ref 0.0–2.0)
Basophils Absolute: 0 10*3/uL (ref 0.0–0.1)
HCT: 33.6 % — ABNORMAL LOW (ref 34.8–46.6)
LYMPH%: 10.8 % — ABNORMAL LOW (ref 14.0–49.7)
MCH: 29.1 pg (ref 25.1–34.0)
MCHC: 33 g/dL (ref 31.5–36.0)
MONO#: 0.6 10*3/uL (ref 0.1–0.9)
NEUT%: 75.9 % (ref 38.4–76.8)
Platelets: 452 10*3/uL — ABNORMAL HIGH (ref 145–400)

## 2009-07-17 ENCOUNTER — Ambulatory Visit: Payer: Self-pay | Admitting: Gynecology

## 2009-07-17 ENCOUNTER — Ambulatory Visit: Payer: Self-pay | Admitting: Hematology and Oncology

## 2009-07-17 LAB — CBC WITH DIFFERENTIAL/PLATELET
Eosinophils Absolute: 0.2 10*3/uL (ref 0.0–0.5)
HCT: 34.5 % — ABNORMAL LOW (ref 34.8–46.6)
HGB: 11.8 g/dL (ref 11.6–15.9)
LYMPH%: 17.9 % (ref 14.0–49.7)
MONO#: 0.7 10*3/uL (ref 0.1–0.9)
NEUT#: 4 10*3/uL (ref 1.5–6.5)
NEUT%: 66.8 % (ref 38.4–76.8)
Platelets: 397 10*3/uL (ref 145–400)
WBC: 6 10*3/uL (ref 3.9–10.3)

## 2009-08-14 LAB — CBC WITH DIFFERENTIAL/PLATELET
Eosinophils Absolute: 0.2 10*3/uL (ref 0.0–0.5)
MONO#: 0.6 10*3/uL (ref 0.1–0.9)
NEUT#: 4.4 10*3/uL (ref 1.5–6.5)
Platelets: 373 10*3/uL (ref 145–400)
RBC: 4.08 10*6/uL (ref 3.70–5.45)
RDW: 16.4 % — ABNORMAL HIGH (ref 11.2–14.5)
WBC: 6 10*3/uL (ref 3.9–10.3)
lymph#: 0.7 10*3/uL — ABNORMAL LOW (ref 0.9–3.3)

## 2009-09-09 ENCOUNTER — Ambulatory Visit: Payer: Self-pay | Admitting: Hematology and Oncology

## 2009-09-11 LAB — CBC WITH DIFFERENTIAL/PLATELET
EOS%: 2.5 % (ref 0.0–7.0)
Eosinophils Absolute: 0.2 10*3/uL (ref 0.0–0.5)
MCV: 85 fL (ref 79.5–101.0)
MONO%: 10 % (ref 0.0–14.0)
NEUT#: 5.2 10*3/uL (ref 1.5–6.5)
RBC: 4.2 10*6/uL (ref 3.70–5.45)
RDW: 15.6 % — ABNORMAL HIGH (ref 11.2–14.5)

## 2009-10-09 ENCOUNTER — Ambulatory Visit: Payer: Self-pay | Admitting: Hematology and Oncology

## 2009-10-09 LAB — CBC WITH DIFFERENTIAL/PLATELET
Basophils Absolute: 0 10*3/uL (ref 0.0–0.1)
EOS%: 2.4 % (ref 0.0–7.0)
HGB: 11.9 g/dL (ref 11.6–15.9)
MCH: 29.2 pg (ref 25.1–34.0)
MCHC: 33.9 g/dL (ref 31.5–36.0)
MCV: 86.2 fL (ref 79.5–101.0)
MONO%: 10.6 % (ref 0.0–14.0)
RBC: 4.08 10*6/uL (ref 3.70–5.45)
RDW: 16.1 % — ABNORMAL HIGH (ref 11.2–14.5)

## 2009-10-20 ENCOUNTER — Ambulatory Visit: Payer: Self-pay | Admitting: Vascular Surgery

## 2009-11-09 ENCOUNTER — Ambulatory Visit: Payer: Self-pay | Admitting: Hematology and Oncology

## 2009-11-09 LAB — CBC WITH DIFFERENTIAL/PLATELET
BASO%: 0.3 % (ref 0.0–2.0)
EOS%: 3 % (ref 0.0–7.0)
HGB: 10 g/dL — ABNORMAL LOW (ref 11.6–15.9)
MCH: 30.7 pg (ref 25.1–34.0)
MCHC: 34.4 g/dL (ref 31.5–36.0)
MONO#: 0.5 10*3/uL (ref 0.1–0.9)
RDW: 17.8 % — ABNORMAL HIGH (ref 11.2–14.5)
WBC: 5.6 10*3/uL (ref 3.9–10.3)
lymph#: 0.7 10*3/uL — ABNORMAL LOW (ref 0.9–3.3)

## 2009-11-10 LAB — COMPREHENSIVE METABOLIC PANEL
ALT: 17 U/L (ref 0–35)
AST: 27 U/L (ref 0–37)
Albumin: 4.1 g/dL (ref 3.5–5.2)
Calcium: 9.1 mg/dL (ref 8.4–10.5)
Chloride: 105 mEq/L (ref 96–112)
Potassium: 4.4 mEq/L (ref 3.5–5.3)
Total Protein: 6.8 g/dL (ref 6.0–8.3)

## 2009-12-09 ENCOUNTER — Ambulatory Visit: Payer: Self-pay | Admitting: Hematology and Oncology

## 2009-12-09 LAB — CBC WITH DIFFERENTIAL/PLATELET
BASO%: 0.4 % (ref 0.0–2.0)
EOS%: 4.1 % (ref 0.0–7.0)
HCT: 29.1 % — ABNORMAL LOW (ref 34.8–46.6)
LYMPH%: 11.8 % — ABNORMAL LOW (ref 14.0–49.7)
MCH: 30.3 pg (ref 25.1–34.0)
MCHC: 33.3 g/dL (ref 31.5–36.0)
NEUT%: 72.6 % (ref 38.4–76.8)
RBC: 3.2 10*6/uL — ABNORMAL LOW (ref 3.70–5.45)
WBC: 4.7 10*3/uL (ref 3.9–10.3)
lymph#: 0.6 10*3/uL — ABNORMAL LOW (ref 0.9–3.3)

## 2009-12-23 ENCOUNTER — Encounter: Admission: RE | Admit: 2009-12-23 | Discharge: 2009-12-23 | Payer: Self-pay | Admitting: Internal Medicine

## 2010-01-06 LAB — CBC WITH DIFFERENTIAL/PLATELET
BASO%: 0.4 % (ref 0.0–2.0)
Eosinophils Absolute: 0.3 10*3/uL (ref 0.0–0.5)
LYMPH%: 14.8 % (ref 14.0–49.7)
MCHC: 34.4 g/dL (ref 31.5–36.0)
MCV: 92 fL (ref 79.5–101.0)
MONO%: 12.1 % (ref 0.0–14.0)
NEUT#: 3.7 10*3/uL (ref 1.5–6.5)
Platelets: 416 10*3/uL — ABNORMAL HIGH (ref 145–400)
RBC: 3.22 10*6/uL — ABNORMAL LOW (ref 3.70–5.45)
RDW: 15.4 % — ABNORMAL HIGH (ref 11.2–14.5)
WBC: 5.4 10*3/uL (ref 3.9–10.3)

## 2010-01-19 ENCOUNTER — Encounter: Admission: RE | Admit: 2010-01-19 | Discharge: 2010-01-19 | Payer: Self-pay | Admitting: Vascular Surgery

## 2010-01-19 ENCOUNTER — Ambulatory Visit: Payer: Self-pay | Admitting: Vascular Surgery

## 2010-01-26 ENCOUNTER — Telehealth (INDEPENDENT_AMBULATORY_CARE_PROVIDER_SITE_OTHER): Payer: Self-pay | Admitting: *Deleted

## 2010-01-27 ENCOUNTER — Ambulatory Visit: Payer: Self-pay

## 2010-01-27 ENCOUNTER — Ambulatory Visit: Payer: Self-pay | Admitting: Cardiovascular Disease

## 2010-01-27 ENCOUNTER — Encounter: Payer: Self-pay | Admitting: Cardiovascular Disease

## 2010-01-27 ENCOUNTER — Encounter (HOSPITAL_COMMUNITY)
Admission: RE | Admit: 2010-01-27 | Discharge: 2010-04-03 | Payer: Self-pay | Source: Home / Self Care | Attending: Vascular Surgery | Admitting: Vascular Surgery

## 2010-02-01 ENCOUNTER — Ambulatory Visit: Payer: Self-pay | Admitting: Hematology and Oncology

## 2010-02-03 LAB — CBC WITH DIFFERENTIAL/PLATELET
Basophils Absolute: 0 10*3/uL (ref 0.0–0.1)
Eosinophils Absolute: 0.2 10*3/uL (ref 0.0–0.5)
HCT: 33.7 % — ABNORMAL LOW (ref 34.8–46.6)
HGB: 11 g/dL — ABNORMAL LOW (ref 11.6–15.9)
LYMPH%: 17.3 % (ref 14.0–49.7)
MCV: 92.1 fL (ref 79.5–101.0)
MONO#: 0.7 10*3/uL (ref 0.1–0.9)
MONO%: 12.4 % (ref 0.0–14.0)
NEUT#: 3.4 10*3/uL (ref 1.5–6.5)
NEUT%: 65.1 % (ref 38.4–76.8)
Platelets: 389 10*3/uL (ref 145–400)
RBC: 3.66 10*6/uL — ABNORMAL LOW (ref 3.70–5.45)

## 2010-02-04 ENCOUNTER — Inpatient Hospital Stay (HOSPITAL_COMMUNITY)
Admission: RE | Admit: 2010-02-04 | Discharge: 2010-02-06 | Payer: Self-pay | Source: Home / Self Care | Admitting: Vascular Surgery

## 2010-02-05 ENCOUNTER — Ambulatory Visit: Payer: Self-pay | Admitting: Vascular Surgery

## 2010-03-02 ENCOUNTER — Ambulatory Visit: Payer: Self-pay | Admitting: Vascular Surgery

## 2010-03-02 ENCOUNTER — Encounter: Admission: RE | Admit: 2010-03-02 | Discharge: 2010-03-02 | Payer: Self-pay | Admitting: Vascular Surgery

## 2010-03-03 ENCOUNTER — Ambulatory Visit: Payer: Self-pay | Admitting: Hematology and Oncology

## 2010-03-03 LAB — CBC WITH DIFFERENTIAL/PLATELET
BASO%: 0.5 % (ref 0.0–2.0)
HCT: 30.7 % — ABNORMAL LOW (ref 34.8–46.6)
LYMPH%: 12.7 % — ABNORMAL LOW (ref 14.0–49.7)
MCH: 28.8 pg (ref 25.1–34.0)
MCHC: 33 g/dL (ref 31.5–36.0)
MCV: 87.3 fL (ref 79.5–101.0)
MONO%: 9.5 % (ref 0.0–14.0)
NEUT%: 73 % (ref 38.4–76.8)
Platelets: 547 10*3/uL — ABNORMAL HIGH (ref 145–400)
RBC: 3.51 10*6/uL — ABNORMAL LOW (ref 3.70–5.45)

## 2010-03-31 LAB — CBC WITH DIFFERENTIAL/PLATELET
Basophils Absolute: 0 10*3/uL (ref 0.0–0.1)
Eosinophils Absolute: 0.2 10*3/uL (ref 0.0–0.5)
HGB: 10.5 g/dL — ABNORMAL LOW (ref 11.6–15.9)
MCV: 86.4 fL (ref 79.5–101.0)
MONO#: 0.6 10*3/uL (ref 0.1–0.9)
MONO%: 11.7 % (ref 0.0–14.0)
NEUT#: 3.4 10*3/uL (ref 1.5–6.5)
Platelets: 372 10*3/uL (ref 145–400)
RDW: 16.6 % — ABNORMAL HIGH (ref 11.2–14.5)
WBC: 4.7 10*3/uL (ref 3.9–10.3)

## 2010-04-24 ENCOUNTER — Encounter: Payer: Self-pay | Admitting: Vascular Surgery

## 2010-04-25 ENCOUNTER — Encounter: Payer: Self-pay | Admitting: Hematology and Oncology

## 2010-04-26 ENCOUNTER — Ambulatory Visit: Payer: Self-pay | Admitting: Hematology and Oncology

## 2010-04-27 ENCOUNTER — Encounter
Admission: RE | Admit: 2010-04-27 | Discharge: 2010-04-27 | Payer: Self-pay | Source: Home / Self Care | Attending: Orthopedic Surgery | Admitting: Orthopedic Surgery

## 2010-05-06 NOTE — Progress Notes (Signed)
Summary: Nuclear pre procedure  Phone Note Outgoing Call Call back at [facility] Phone [phone]   Call placed by: Rea College, CMA,  January 26, 2010 2:55 PM Call placed to: Patient Summary of Call: Reviewed information on [employer] Information Sheet (see scanned document for further details).  Spoke with patient.      Nuclear Med Background Indications for Stress Test: Evaluation for Ischemia, Surgical Clearance  Indications Comments: Pending AAA (5.0 cc) repair by Dr. Josephina Gip on 02/05/10    Symptoms: DOE    Nuclear Pre-Procedure Cardiac Risk Factors: Family History - CAD, History of Smoking, Hypertension, Lipids, PVD

## 2010-05-06 NOTE — Assessment & Plan Note (Signed)
Summary: Cardiology Nuclear Testing  Nuclear Med Background Indications for Stress Test: Evaluation for Ischemia, Surgical Clearance  Indications Comments: Pending AAA (5.0 cc) repair by Dr. Josephina Gip on 02/05/10   History Comments: No documented CAD  Symptoms: DOE    Nuclear Pre-Procedure Cardiac Risk Factors: Family History - CAD, History of Smoking, Hypertension, Lipids, Obesity, PVD Caffeine/Decaff Intake: None NPO After: 8:00 PM Lungs: Clear.  O2 Sat 97% on RA. IV 0.9% NS with Angio Cath: 22g     IV Site: R Antecubital IV Started by: Irean Hong, RN Chest Size (in) 42     Cup Size D     Height (in): 64 Weight (lb): 198 BMI: 34.11  Nuclear Med Study 1 or 2 day study:  1 day     Stress Test Type:  Adenosine Reading MD:  Charlton Haws, MD     Referring MD:  Gardenia Phlegm, MD Resting Radionuclide:  Technetium 18m Tetrofosmin     Resting Radionuclide Dose:  11 mCi  Stress Radionuclide:  Technetium 92m Tetrofosmin     Stress Radionuclide Dose:  33 mCi   Stress Protocol  Dose of Adenosine:  50.4 mg    Stress Test Technologist:  Rea College, CMA-N     Nuclear Technologist:  Domenic Polite, CNMT  Rest Procedure  Myocardial perfusion imaging was performed at rest 45 minutes following the intravenous administration of Technetium 14m Tetrofosmin.  Stress Procedure  The patient received IV adenosine at 140 mcg/kg/min for 4 minutes. There were no significant changes with infusion; occasional PAC's. Technetium 13m Tetrofosmin was injected at the 2 minute mark and quantitative spect images were obtained after a 45 minute delay.  QPS Raw Data Images:  motion artifact Stress Images:  Normal homogeneous uptake in all areas of the myocardium. Rest Images:  Normal homogeneous uptake in all areas of the myocardium. Subtraction (SDS):  Normal Transient Ischemic Dilatation:  1.05  (Normal <1.22)  Lung/Heart Ratio:  .29  (Normal <0.45)  Quantitative Gated Spect Images QGS EDV:   68 ml QGS ESV:  26 ml QGS EF:  63 % QGS cine images:  normal  Findings Low risk nuclear study      Overall Impression  Exercise Capacity: Adenosine study with no exercise. BP Response: Normal blood pressure response. Clinical Symptoms: Chest tightness ECG Impression: Baseline ECG appears to show pre-excitation in the inferolateral leads Overall Impression: Apical thinning not thought to be clinically significant.  No ischemia. Baseline ECG appears to have pre-excitation

## 2010-05-11 ENCOUNTER — Encounter (HOSPITAL_BASED_OUTPATIENT_CLINIC_OR_DEPARTMENT_OTHER): Payer: Medicare Other | Admitting: Hematology and Oncology

## 2010-05-11 ENCOUNTER — Other Ambulatory Visit: Payer: Self-pay | Admitting: Hematology and Oncology

## 2010-05-11 DIAGNOSIS — D638 Anemia in other chronic diseases classified elsewhere: Secondary | ICD-10-CM

## 2010-05-11 DIAGNOSIS — C20 Malignant neoplasm of rectum: Secondary | ICD-10-CM

## 2010-05-11 DIAGNOSIS — N289 Disorder of kidney and ureter, unspecified: Secondary | ICD-10-CM

## 2010-05-11 LAB — CEA: CEA: 4.2 ng/mL (ref 0.0–5.0)

## 2010-05-11 LAB — COMPREHENSIVE METABOLIC PANEL
ALT: 9 U/L (ref 0–35)
Albumin: 3.9 g/dL (ref 3.5–5.2)
CO2: 22 mEq/L (ref 19–32)
Chloride: 102 mEq/L (ref 96–112)
Glucose, Bld: 85 mg/dL (ref 70–99)
Potassium: 4.8 mEq/L (ref 3.5–5.3)
Sodium: 136 mEq/L (ref 135–145)
Total Bilirubin: 0.3 mg/dL (ref 0.3–1.2)
Total Protein: 7 g/dL (ref 6.0–8.3)

## 2010-05-11 LAB — CBC WITH DIFFERENTIAL/PLATELET
BASO%: 0.2 % (ref 0.0–2.0)
Eosinophils Absolute: 0.2 10*3/uL (ref 0.0–0.5)
LYMPH%: 12.4 % — ABNORMAL LOW (ref 14.0–49.7)
MCHC: 33.2 g/dL (ref 31.5–36.0)
MONO#: 0.6 10*3/uL (ref 0.1–0.9)
NEUT#: 4.8 10*3/uL (ref 1.5–6.5)
Platelets: 523 10*3/uL — ABNORMAL HIGH (ref 145–400)
RBC: 3.92 10*6/uL (ref 3.70–5.45)
RDW: 16.3 % — ABNORMAL HIGH (ref 11.2–14.5)
WBC: 6.4 10*3/uL (ref 3.9–10.3)
lymph#: 0.8 10*3/uL — ABNORMAL LOW (ref 0.9–3.3)

## 2010-05-26 ENCOUNTER — Encounter (HOSPITAL_BASED_OUTPATIENT_CLINIC_OR_DEPARTMENT_OTHER): Payer: Medicare Other | Admitting: Hematology and Oncology

## 2010-05-26 DIAGNOSIS — D638 Anemia in other chronic diseases classified elsewhere: Secondary | ICD-10-CM

## 2010-05-26 DIAGNOSIS — N289 Disorder of kidney and ureter, unspecified: Secondary | ICD-10-CM

## 2010-05-26 DIAGNOSIS — C20 Malignant neoplasm of rectum: Secondary | ICD-10-CM

## 2010-05-28 ENCOUNTER — Other Ambulatory Visit: Payer: Self-pay | Admitting: Hematology and Oncology

## 2010-05-28 DIAGNOSIS — C20 Malignant neoplasm of rectum: Secondary | ICD-10-CM

## 2010-06-15 LAB — BASIC METABOLIC PANEL
CO2: 19 mEq/L (ref 19–32)
Calcium: 8.5 mg/dL (ref 8.4–10.5)
Calcium: 8.9 mg/dL (ref 8.4–10.5)
Chloride: 105 mEq/L (ref 96–112)
Chloride: 110 mEq/L (ref 96–112)
Creatinine, Ser: 1.96 mg/dL — ABNORMAL HIGH (ref 0.4–1.2)
Creatinine, Ser: 2.29 mg/dL — ABNORMAL HIGH (ref 0.4–1.2)
GFR calc Af Amer: 25 mL/min — ABNORMAL LOW (ref >60)
GFR calc Af Amer: 30 mL/min — ABNORMAL LOW (ref >60)
GFR calc non Af Amer: 21 mL/min — ABNORMAL LOW (ref >60)
GFR calc non Af Amer: 26 mL/min — ABNORMAL LOW (ref >60)
Glucose, Bld: 168 mg/dL — ABNORMAL HIGH (ref 70–99)
Potassium: 4.7 mEq/L (ref 3.5–5.1)
Sodium: 132 mEq/L — ABNORMAL LOW (ref 135–145)
Sodium: 136 mEq/L (ref 135–145)
Sodium: 139 mEq/L (ref 135–145)

## 2010-06-15 LAB — URINALYSIS, MICROSCOPIC ONLY
Nitrite: NEGATIVE
Protein, ur: NEGATIVE mg/dL
Specific Gravity, Urine: 1.011 (ref 1.005–1.030)
Urobilinogen, UA: 0.2 mg/dL (ref 0.0–1.0)

## 2010-06-15 LAB — CBC
HCT: 30.9 % — ABNORMAL LOW (ref 36.0–46.0)
Hemoglobin: 10.4 g/dL — ABNORMAL LOW (ref 12.0–15.0)
Hemoglobin: 11.3 g/dL — ABNORMAL LOW (ref 12.0–15.0)
Hemoglobin: 9.7 g/dL — ABNORMAL LOW (ref 12.0–15.0)
MCH: 29.5 pg (ref 26.0–34.0)
MCHC: 32.2 g/dL (ref 30.0–36.0)
MCV: 91.5 fL (ref 78.0–100.0)
MCV: 93.9 fL (ref 78.0–100.0)
Platelets: 339 10*3/uL (ref 150–400)
RBC: 3.29 MIL/uL — ABNORMAL LOW (ref 3.87–5.11)
RBC: 3.53 MIL/uL — ABNORMAL LOW (ref 3.87–5.11)
RBC: 3.76 MIL/uL — ABNORMAL LOW (ref 3.87–5.11)
RDW: 14.1 % (ref 11.5–15.5)
WBC: 5.1 10*3/uL (ref 4.0–10.5)
WBC: 7.7 10*3/uL (ref 4.0–10.5)

## 2010-06-15 LAB — CROSSMATCH
ABO/RH(D): B POS
Unit division: 0

## 2010-06-15 LAB — MRSA PCR SCREENING: MRSA by PCR: NEGATIVE

## 2010-06-15 LAB — ABO/RH: ABO/RH(D): B POS

## 2010-06-15 LAB — PROTIME-INR
INR: 0.97 (ref 0.00–1.49)
Prothrombin Time: 13.1 seconds (ref 11.6–15.2)

## 2010-06-15 LAB — APTT: aPTT: 26 seconds (ref 24–37)

## 2010-06-23 ENCOUNTER — Other Ambulatory Visit: Payer: Self-pay | Admitting: Hematology and Oncology

## 2010-06-23 ENCOUNTER — Encounter (HOSPITAL_BASED_OUTPATIENT_CLINIC_OR_DEPARTMENT_OTHER): Payer: Medicare Other | Admitting: Hematology and Oncology

## 2010-06-23 DIAGNOSIS — D638 Anemia in other chronic diseases classified elsewhere: Secondary | ICD-10-CM

## 2010-06-23 DIAGNOSIS — D631 Anemia in chronic kidney disease: Secondary | ICD-10-CM

## 2010-06-23 DIAGNOSIS — C20 Malignant neoplasm of rectum: Secondary | ICD-10-CM

## 2010-06-23 DIAGNOSIS — N039 Chronic nephritic syndrome with unspecified morphologic changes: Secondary | ICD-10-CM

## 2010-06-23 DIAGNOSIS — N189 Chronic kidney disease, unspecified: Secondary | ICD-10-CM

## 2010-06-23 DIAGNOSIS — N289 Disorder of kidney and ureter, unspecified: Secondary | ICD-10-CM

## 2010-06-23 LAB — CBC WITH DIFFERENTIAL/PLATELET
Basophils Absolute: 0 10*3/uL (ref 0.0–0.1)
Eosinophils Absolute: 0.2 10*3/uL (ref 0.0–0.5)
HCT: 34.1 % — ABNORMAL LOW (ref 34.8–46.6)
HGB: 11.3 g/dL — ABNORMAL LOW (ref 11.6–15.9)
MCV: 83.9 fL (ref 79.5–101.0)
MONO%: 8.6 % (ref 0.0–14.0)
NEUT#: 3.5 10*3/uL (ref 1.5–6.5)
NEUT%: 74.7 % (ref 38.4–76.8)
Platelets: 342 10*3/uL (ref 145–400)
RDW: 16.9 % — ABNORMAL HIGH (ref 11.2–14.5)

## 2010-06-23 LAB — VITAMIN B12: Vitamin B-12: 588 pg/mL (ref 211–911)

## 2010-06-23 LAB — IRON AND TIBC
%SAT: 18 % — ABNORMAL LOW (ref 20–55)
Iron: 57 ug/dL (ref 42–145)

## 2010-07-21 ENCOUNTER — Other Ambulatory Visit: Payer: Self-pay | Admitting: Hematology and Oncology

## 2010-07-21 ENCOUNTER — Encounter (HOSPITAL_BASED_OUTPATIENT_CLINIC_OR_DEPARTMENT_OTHER): Payer: Medicare Other | Admitting: Hematology and Oncology

## 2010-07-21 DIAGNOSIS — C20 Malignant neoplasm of rectum: Secondary | ICD-10-CM

## 2010-07-21 DIAGNOSIS — N289 Disorder of kidney and ureter, unspecified: Secondary | ICD-10-CM

## 2010-07-21 DIAGNOSIS — D638 Anemia in other chronic diseases classified elsewhere: Secondary | ICD-10-CM

## 2010-07-21 LAB — CBC WITH DIFFERENTIAL/PLATELET
BASO%: 0.4 % (ref 0.0–2.0)
Basophils Absolute: 0 10*3/uL (ref 0.0–0.1)
EOS%: 3.8 % (ref 0.0–7.0)
HGB: 10.7 g/dL — ABNORMAL LOW (ref 11.6–15.9)
MCH: 28.8 pg (ref 25.1–34.0)
MCHC: 33.7 g/dL (ref 31.5–36.0)
RDW: 17.4 % — ABNORMAL HIGH (ref 11.2–14.5)
WBC: 5.7 10*3/uL (ref 3.9–10.3)
lymph#: 0.8 10*3/uL — ABNORMAL LOW (ref 0.9–3.3)

## 2010-08-04 ENCOUNTER — Other Ambulatory Visit: Payer: Self-pay | Admitting: Internal Medicine

## 2010-08-04 ENCOUNTER — Ambulatory Visit (HOSPITAL_COMMUNITY)
Admission: RE | Admit: 2010-08-04 | Discharge: 2010-08-04 | Disposition: A | Discharge status: Home / Self Care | Payer: Medicare Other | Source: Ambulatory Visit | Attending: Internal Medicine | Admitting: Internal Medicine

## 2010-08-04 DIAGNOSIS — M7989 Other specified soft tissue disorders: Secondary | ICD-10-CM | POA: Insufficient documentation

## 2010-08-04 DIAGNOSIS — M1990 Unspecified osteoarthritis, unspecified site: Secondary | ICD-10-CM | POA: Insufficient documentation

## 2010-08-04 DIAGNOSIS — T1490XA Injury, unspecified, initial encounter: Secondary | ICD-10-CM

## 2010-08-04 DIAGNOSIS — M79609 Pain in unspecified limb: Secondary | ICD-10-CM | POA: Insufficient documentation

## 2010-08-16 ENCOUNTER — Other Ambulatory Visit: Payer: Self-pay | Admitting: Vascular Surgery

## 2010-08-17 NOTE — Assessment & Plan Note (Signed)
OFFICE VISIT   Ashley Davenport, Ashley Davenport  DOB:  Jul 28, 1930                                       10/28/2008  NWGNF#:[id number]   The patient returns today for followup regarding her abdominal aortic  aneurysm which has been in the 4.6 to 4.8 cm range in the past.  She had  a CT scan in April of 2009 and also in October of 2009.  She had a  repeat scan in June of this year ordered by Dr. Dalene Carrow which reveals  that the aneurysm continues to be 4.8 cm in maximum diameter with no  evidence of leak or change.  We therefore did not perform a duplex scan  in our office today.  She denies any chest pain, dyspnea on exertion,  PND, orthopnea or claudication symptoms.  She is now 2 years out from  her adenocarcinoma of the rectum and is doing well from that standpoint.  She takes one aspirin a day.   PHYSICAL EXAM:  Blood pressure 153/88, heart rate 72, respirations 14.  Carotid pulses are 3+, no audible bruits.  Neurological:  Normal.  Chest:  Clear to auscultation.  Cardiovascular:  Regular rhythm, no  murmurs.  Abdomen:  Soft, nontender with 4-5 cm pulsatile mass.  She has  3+ femoral pulses bilaterally with well-perfused lower extremities.   I reassured her regarding these findings and we will see her in 1 year  with a duplex scan in our office to follow this aneurysm.   [name], M.D.  Electronically Signed   JDL/MEDQ  D:  10/28/2008  T:  10/29/2008  Job:  [id number]   cc:   Margaretmary Bayley, M.D.  Lauretta I. Odogwu, M.D.

## 2010-08-17 NOTE — Assessment & Plan Note (Signed)
OFFICE VISIT   Ashley Davenport, Ashley Davenport  DOB:  12/03/1930                                       01/19/2010  QIONG#:[id number]   The patient returns today for continued follow-up regarding her  infrarenal abdominal aortic aneurysm, which I have been following for  the past few years.  I saw her in July of this year when her duplex scan  revealed the aneurysm to be 4.8 cm in maximum diameter.  Today she had a  CT angiogram without contrast because of some mild renal insufficiency.  I have reviewed the CT angiogram, and it reveals the maximum diameter of  her aneurysm to be 5 cm with an adequate infrarenal neck.  She appears  to be a good candidate for aortic stent grafting for this aneurysm.  She  desires to press ahead with the procedure to get this aneurysm resolved.   CHRONIC MEDICAL PROBLEMS:  1. Hypertension.  2. Hyperlipidemia.  3. History of rectal cancer.  4. Some mild chronic renal insufficiency, followed by Dr. Lowell Guitar.      Recent creatinine to 2.18.   SOCIAL HISTORY:  She is single and has 3 children.  Is a retired [employer] fundraiser.  Has not smoked in 20 years.  Drinks occasional alcohol.   FAMILY HISTORY:  Positive for coronary artery disease in her mother and  her father.   REVIEW OF SYSTEMS:  Positive for dyspnea on exertion.  Negative for  chest pain.  Denies any back or abdominal pain.  All other systems in  the review of systems are negative.   PHYSICAL EXAMINATION:  Blood pressure 149/84, heart rate 80,  respirations 20.  Generally, she is a well-developed, well-nourished  female in no apparent distress.  Alert, oriented x3.  HEENT:  Normal for  age.  EOMs intact.  Lungs:  Clear to auscultation.  No rhonchi or  wheezing.  Cardiovascular:  Regular rhythm.  No murmurs.  Carotid pulses  are 3+, no bruits.  Abdomen is soft, nontender.  There is a 5-cm  pulsatile mass, mid epigastrium.  She has 3+ femoral and 2+ dorsalis  pedis pulses bilaterally.   We  will obtain a Cardiolite to be certain she does not have silent  coronary artery disease.  I have tentatively scheduled her stent graft  procedure for Friday, November 4, at [facility].  Risks and benefits  have been thoroughly discussed with the patient.  She would like to  proceed.  I will use a Gore Excluder graft.     [name], M.D.  Electronically Signed   JDL/MEDQ  D:  01/19/2010  T:  01/20/2010  Job:  [id number]

## 2010-08-17 NOTE — Assessment & Plan Note (Signed)
OFFICE VISIT   Ashley, Davenport  DOB:  09/04/1930                                       10/20/2009  ZOXWR#:[id number]   [name] returns for continued follow-up regarding her infrarenal  abdominal aneurysm which measures approximately 4.8 cm on maximum  diameter.  It is not changed much in the last couple of the year and a  duplex scan in our office was ordered by me today, reviewed and  interpreted and this reveals the aneurysm to be 4.8 x 4.4 cm.  She has  had no abdominal or back symptoms.  She continues to do well from her  rectal cancer which was treated 3 years ago.   Chronic medical problems:  1. Hypertension.  2. Hyperlipidemia.  3. History of rectal cancer.  4. History of postherpetic neuralgia.  5. Negative for coronary artery disease or stroke.   SOCIAL HISTORY:  She is retired.  Does not use tobacco or alcohol.  She  is widowed and has 3 children.   REVIEW OF SYSTEMS:  Negative for chest pain, dyspnea on exertion, PND,  orthopnea, no wheezing, bronchitis or other specific symptoms.  Denies  claudication.   PHYSICAL EXAMINATION:  Blood pressure 138/85, heart rate 74, temperature  97.8.  GENERAL:  This is a well-developed, well-nourished female in no apparent  distress, alert and oriented x3.  HEENT:  Exam is normal for age.  EOMs intact.  LUNGS:  Clear to auscultation.  No rhonchi or wheezing.  CARDIOVASCULAR:  Regular rhythm.  No murmurs.  Carotid pulses 3+ with no  bruits.  ABDOMEN:  Soft, nontender with an approximate 5 cm pulsatile mass in the  midepigastrium.  She has 3+ femoral and 2+ dorsalis pedis pulse bilaterally.   She is concerned about the aneurysm and was leaning toward having this  repaired at present time or soon.  We will have her return in 3 months  with a CT angiogram to see if she has in fact a candidate for stent  grafting which it appears she is from her previous noncontrast CT.  If  she has any  abdominal or back symptoms in the interim, she will report  to emergency room.  If she should change her mind and decide she wants  the aneurysm treated sooner than that, she will call us at that time.     Ashley Davenport, M.D.  Electronically Signed   JDL/MEDQ  D:  10/20/2009  T:  10/20/2009  Job:  [id number]

## 2010-08-17 NOTE — Assessment & Plan Note (Signed)
OFFICE VISIT   Ashley Davenport, Ashley Davenport  DOB:  [birth year]                                       01/29/2008  ZOXWR#:[id number]   This is an office visit.  The patient returns for followup regarding  infrarenal abdominal aortic aneurysm discovered by CT scanning in April.  At that point it measured 4.8 cm maximum diameter and appeared to be a  candidate for stent graft, although contrast study was not performed.  She does have a history of adenocarcinoma of the rectum and has been  followed in the cancer clinic by Dr. Dalene Carrow and is doing well from that  standpoint with no evidence of recurrence.  She has had no abdominal or  back symptoms.  She denies any hemispheric or nonhemispheric TIAs,  amaurosis fugax, diplopia, blurred vision, syncope, chest pain, dyspnea  on exertion, PND, orthopnea or claudication.   PHYSICAL EXAMINATION:  Vital signs:  On physical exam blood pressure  141/86, heart rate is 84, respirations 14.  Her carotid pulses were 3+  and no audible bruits.  Neurological:  Normal.  Chest:  Clear to  auscultation.  Abdomen:  Soft, nontender with pulsatile mass measuring 4-  5 cm in diameter which is nontender.  She has 3+ femoral pulses  bilaterally with well-perfused lower extremities.   I have reviewed her CT scan which was done last week ordered by the  cancer clinic and the aneurysm today has a maximum diameter of 4.6 cm,  certainly no larger than before.  We will see her in 9 months with a  followup duplex scan in our office to assess the size of the aneurysm.  If she has any symptoms she will report to the emergency department.   [name], M.D.  Electronically Signed   JDL/MEDQ  D:  01/29/2008  T:  01/30/2008  Job:  [id number]   cc:   Margaretmary Bayley, M.D.  Lauretta I. Odogwu, M.D.

## 2010-08-17 NOTE — Consult Note (Signed)
NEW PATIENT CONSULTATION   SYLINA, HENION C  DOB:  01-May-1930                                        Aug 09, 2007  CHART #:  [id number]   REQUESTING PHYSICIAN:  Lauretta I. Odogwu, M.D.   PRIMARY CARE PHYSICIAN:  Margaretmary Bayley, M.D.   REASON FOR CONSULTATION:  History of rectal cancer.  Mildly dilated  ascending aorta.  Question of lung nodules on CT scan.   HISTORY OF PRESENT ILLNESS:  The patient is a 77 year old female who was  previously diagnosed with rectal cancer, T2 N0, moderately  differentiated rectal carcinoma with lymphovascular invasion and  involvement of the surgical margins.  She underwent 5-FU and radiation  from the spring of 2008.  She also had 3 cycles of Xeloda.  Over this  period of time she has had CT scan of the chest, abdomen and also a PET  scan in August.  Most recent CT scan of the chest was performed July 16, 2007.  She had a slightly ectatic mildly dilated ascending aorta,  maximum diameter 3.7 mm, unchanged from the prior exam.  No evidence of  mediastinal or hilar lymph nodes, tiny 2-3 mm nodule in the right lower  lobe, unchanged, tiny nodule in the left upper lobe 2-3 mm, also  unchanged from a previous scan March 13, 2007.  The patient does have  a previous history of smoking but quit more than 20 years ago.  She has  had no hemoptysis, has no respiratory symptoms.  No cough and no  hemoptysis.   PAST MEDICAL HISTORY:  Other than noted above includes elevated  cholesterol, hypertension, hypothyroidism, history of shingles 4-5  months ago with continued postherpetic neuralgia on the lower sternum.   PREVIOUS SURGICAL HISTORY:  Is noted above.   FAMILY HISTORY AND SOCIAL HISTORY:  The patient no longer smokes.  Occasional alcohol.  No history of heart or vascular disease in her  family.   ALLERGIES:  INCLUDE SHELLFISH.   CURRENT MEDICATIONS:  1. Include Lotensin 20 mg a day.  2. Cartia XT 180 mg b.i.d.  3.  Lexapro 10 mg b.i.d.  4. Lasix 20.  5. Iron.  6. Synthroid.  7. MiraLax.  8. Crestor 5 mg a day.  9. B6 50 mg b.i.d.  10.Compazine 10 mg q.8 h p.r.n.   PHYSICAL EXAMINATION:  The patient is 194 pounds, 5 feet 4 inches tall.  She is awake, alert, neurologically intact.  I do not appreciate any  carotid bruits.  LUNGS:  Clear bilaterally.  She has no cervical or supraclavicular  adenopathy.  No inguinal adenopathy.  ABDOMINAL EXAM:  The aorta is just  barely palpable.  She has no pedal edema or leg tenderness.   CT scan of the chest and abdomen and previous PET scans are reviewed.   IMPRESSION:  Patient with several 2-3 mm nonspecific pulmonary nodules  in the setting of history of rectal carcinoma with positive surgical  margins, status post radiation and chemotherapy.  At this point I would  not recommend any specific treatment for the mildly dilated ascending  aorta.  She has seen Dr. Hart Rochester, vascular surgery, for her abdominal  aortic aneurysm which is a maximum of 4.8 cm in size.  She already has a  follow-up CT scan of the chest arranged through a Dr. Dalene Carrow  for July of  this year which I agree with completely.  At this point the nodules are  too small to characterize with PET scan.  They have not changed between  December and April but further followup in a high-risk setting is  indicated.  If we see change in size of growth of the lung nodules, PET  scan and then consideration of biopsy could be entertained.  I have not  made the patient a return appointment to see me as she is closely  followed in the cancer center, but should changes in the CT scan  necessitate, I would be glad to see her back.   Sheliah Plane, MD  Electronically Signed   EG/MEDQ  D:  08/09/2007  T:  08/09/2007  Job:  [id number]   cc:   Margaretmary Bayley, M.D.  Lauretta I. Odogwu, M.D.

## 2010-08-17 NOTE — Assessment & Plan Note (Signed)
OFFICE VISIT   Ashley Davenport, Ashley Davenport  DOB:  [birth year]                                       03/02/2010  ZOXWR#:[id number]   The patient is a 78 year old female status post aortic stent grafting on  November 4 for an infrarenal abdominal aortic aneurysm with insertion of  aortobicommon iliac Gore stent graft.  She has done well since her  procedure with no specific complaints today and no complications.  Her  appetite is good.  Her activity has improved and she is having no pain  or fever.   PHYSICAL EXAM:  Blood pressure 128/88, heart rate 82, respirations 14.  Her abdomen is soft, nontender with no pulsatile mass noted.  She has  well-healed inguinal incisions with 3+ femoral pulses bilaterally.   Today I reviewed her CT scan which was done without contrast because of  an elevated creatinine at 1.89.  There is no evidence of any stent  fracture or migration of the graft, it appears to be in excellent  position and the aneurysm sac is stable in size.   In general I think she is doing well.  We will see her back in 6 months  with a CT scan performed at that time and will then obtain a duplex scan  in our office as a 12 month study.  She will return to see [place] if she has  any problems in the interim.     [name], M.D.  Electronically Signed   JDL/MEDQ  D:  03/02/2010  T:  03/02/2010  Job:  [id number]   cc:   Vicente Serene I. Odogwu, M.D.  Margaretmary Bayley, M.D.

## 2010-08-17 NOTE — Procedures (Signed)
DUPLEX ULTRASOUND OF ABDOMINAL AORTA   INDICATION:  Follow up AAA   HISTORY:  Diabetes:  No  Cardiac:  No  Hypertension:  Yes  Smoking:  Previous  Connective Tissue Disorder:  Family History:  No  Previous Surgery:  No   DUPLEX EXAM:         AP (cm)                   TRANSVERSE (cm)  Proximal             2.2 cm                    2.1 cm  Mid                  4.4 cm                    4.8 cm  Distal               3.4 cm                    3.2 cm  Right Iliac                                    1.18 cm  Left Iliac                                     1.57 cm   PREVIOUS:  Date: CT on 01/11/2008  AP:  4.3  TRANSVERSE:  4.6   IMPRESSION:  1. Abdominal aortic aneurysm with the largest diameter of 4.4 x 4.8      cm.  2. Stable by previous CT.   ___________________________________________  Quita Skye. Hart [place], M.D.   NT/MEDQ  D:  10/20/2009  T:  10/20/2009  Job:  [id number]

## 2010-08-17 NOTE — Consult Note (Signed)
VASCULAR SURGERY CONSULTATION   Blass, Desani C  DOB:  1931/03/19                                       07/30/2007  ZOXWR#:[id number]   The patient is a 77 year old female patient who has a history of  adenocarcinoma of the rectum (T2N0) which was resected transanally by  Dr. Talmage Nap 1 year ago, and she has had no evidence of any recurrence.  She received adjuvant chemotherapy and radiation, and has been followed  in the Cancer Clinic by Dr. Dalene Carrow.  CT scan performed earlier this  month (07/16/2007) revealed no evidence of any recurrent cancer and a  4.8 cm infrarenal abdominal aortic aneurysm which was unchanged from  previous scan done in December 2008.  She was referred for evaluation.   PAST MEDICAL HISTORY:  Hypertension, hyperlipidemia, history of post  herpetic neuralgia with shingles occurring following a CT scan in  December, negative for diabetes, coronary artery disease, COPD or  stroke.   PAST SURGICAL HISTORY:  Transanal removal of rectal tumor, hysterectomy.   FAMILY HISTORY:  Positive coronary artery disease in mother and father  both dying in their 69s from myocardial infarction and diabetes in her  father.  Negative for stroke.   SOCIAL HISTORY:  She is single and retired.  She smoked 1 pack of  cigarettes per day until 1990 but discontinued that.  Drinks occasional  alcohol.   REVIEW OF SYSTEMS:  Is negative for GI, GU, vascular, neuro, ortho,  psychiatric, ENT.  Also denies chest pain, dyspnea on exertion, PND, orthopnea.  No weight loss or anorexia.   ALLERGIES:  None.   MEDICATIONS:  Please see health history form.   PHYSICAL EXAM:  Blood pressure 138/86, heart rate 70, respirations 14.  General:  She is a healthy-appearing female in no apparent stress alert  and x3.  Neck is supple 3+ carotid pulses palpable.  No bruits are  audible.  Neurologic:  Normal.  Chest:  Clear to auscultation.  Cardiovascular:  Regular rhythm.  No  murmurs.  Abdomen:  Soft, nontender  with pulsatile mass in the midepigastrium.  This measures approximately  5 cm in diameter.  No skin rashes are noted.  She has excellent femoral  pulses bilaterally with well-perfused lower extremities.   I reviewed the CT scan.  It does appears she may be a candidate for  stent grafting if that becomes necessary.  Her proximal neck appears to  be about 1.5 cm in length, but contrast CT has not been done.  She is  scheduled in a repeat CT in July and we will have her back here in 6  months with a duplex scan of the aneurysm at that time in our office to  see if any growth has occurred.  She was instructed about symptoms of  ruptured aneurysm and to report to the emergency department if she has  abdominal or back symptoms which are acute.   [name], M.D.  Electronically Signed  JDL/MEDQ  D:  07/30/2007  T:  07/31/2007  Job:  [id number]   cc:   Margaretmary Bayley, M.D.  Lauretta I. Odogwu, M.D.

## 2010-08-18 ENCOUNTER — Encounter: Payer: Medicare Other | Admitting: Hematology and Oncology

## 2010-08-18 ENCOUNTER — Other Ambulatory Visit: Payer: Self-pay | Admitting: Vascular Surgery

## 2010-08-18 ENCOUNTER — Other Ambulatory Visit: Payer: Self-pay | Admitting: Hematology and Oncology

## 2010-08-18 DIAGNOSIS — I714 Abdominal aortic aneurysm, without rupture, unspecified: Secondary | ICD-10-CM

## 2010-08-18 LAB — CBC WITH DIFFERENTIAL/PLATELET
Basophils Absolute: 0 10*3/uL (ref 0.0–0.1)
Eosinophils Absolute: 0.1 10*3/uL (ref 0.0–0.5)
HGB: 11.5 g/dL — ABNORMAL LOW (ref 11.6–15.9)
NEUT#: 4.1 10*3/uL (ref 1.5–6.5)
RDW: 16.6 % — ABNORMAL HIGH (ref 11.2–14.5)
lymph#: 0.9 10*3/uL (ref 0.9–3.3)

## 2010-08-20 NOTE — Procedures (Signed)
[place]  Patient:    Ashley Davenport, Ashley Davenport Visit Number: [id number] MRN: [id number]          Service Type: END Location: ENDO Attending Physician:  Orland Mustard Dictated by:   Llana Aliment. Randa Evens, M.D. Proc. Date: 07/05/01 Admit Date:  07/05/2001 Discharge Date: 07/05/2001   CC:         Lindell Spar. Chestine Spore, M.D.   Procedure Report  DATE OF BIRTH:  1930/12/19  PROCEDURE:  Colonoscopy and polypectomy.  ENDOSCOPIST:  Llana Aliment. Edwards, M.D.  MEDICATIONS:  Fentanyl 100 mcg, Versed 10 mg IV.  SCOPE:  Olympus pediatric video colonoscope.  INDICATIONS:  Family history of colonic neoplasia in identical twin.  DESCRIPTION OF PROCEDURE:  The procedure had been explained to the patient and consent obtained.  With the patient in the left lateral decubitus, the Olympus video colonoscope was inserted and advanced under direct visualization.  The prep was very marginal.  A small polyp could have been missed due to sticky adherent stool.  I reached the cecum and tip of the cecum and just above the tip of the cecum were two polyps, each approximately 1.5 cm that were removed with the snare and were recovered together and brought out.  The scope was reinserted.  I went back to the transverse colon and went through.  No other polyps were seen up until that area although the mucosa was fairly dirty.  In the proximal descending colon a 1.5 cm polyp was removed and was placed in a single jar recovered.  The scope was reinserted and another 1.5 cm polyp was removed and placed in a third jar.  Near this polyp site was a 0.5 cm polyp that was removed and placed in the same jar.  Marked diverticular disease in the sigmoid colon but no gross sigmoid polyps seen.  The rectum was free of polyps.  Multiple polyps were removed.  PLAN:  We will recommend repeating procedure in one year.  Routine post polypectomy instructions. Dictated by:   Llana Aliment. Randa Evens,  M.D. Attending Physician:  Orland Mustard DD:  07/05/01 TD:  07/06/01 Job: [id number]

## 2010-08-20 NOTE — Op Note (Signed)
Ashley Davenport, Ashley Davenport         ACCOUNT NO.:  [id number]   MEDICAL RECORD NO.:  [id number]          PATIENT TYPE:  AMB   LOCATION:  DFTL                         FACILITY:  [facility]   PHYSICIAN:  James L. Malon Kindle., M.D.DATE OF BIRTH:  01-29-1931   DATE OF PROCEDURE:  06/28/2004  DATE OF DISCHARGE:                                 OPERATIVE REPORT   PROCEDURE:  Colonoscopy and polypectomy.   MEDICATIONS:  Fentanyl 100 mcg, Versed 10 mg IV.   SCOPE:  Olympus pediatric adjustable colonoscope.   INDICATION:  The patient has had a previous history of adenomatous colon  polyps.  This is done as a followup procedure.  One of the polyps a year and  a half ago had high-grade dysplasia.   DESCRIPTION OF PROCEDURE:  The procedure was explained to the patient and  consent obtained.  With the patient in the left lateral decubitus position,  the scope was inserted and advanced.  We advanced up to the proximal  transverse colon.  At this point, a 3/4 cm sessile polyp was removed with a  snare and sucked through the scope.  The scope was advanced on to the cecum,  ileocecal valve, and appendiceal orifice were seen.  The scope was withdrawn  in the cecum and ascending colon which were seen well without polyps.  Polypectomy site was seen in the transverse colon and was not actively  bleeding.  The remainder of the transverse colon, splenic flexure,  descending, and sigmoid colon were seen well and were free of polyps.  There  was moderate diverticulosis seen.  The scope was withdrawn.  The rectum was  free of polyps.  The patient tolerated the procedure well.   ASSESSMENT:  1.  Transverse colon polyp, removed, 211.3.  2.  Diverticulosis, 562.10.   PLAN:  1.  Will check pathology.  2.  Routine post polypectomy instructions.  3.  Recommended repeating procedure in 5 years.     JLE/MEDQ  D:  06/28/2004  T:  06/28/2004  Job:  [id number]   cc:   Margaretmary Bayley, M.D.  [address], [address]  [place]  [place] [address]  Fax: [phone]

## 2010-08-20 NOTE — Op Note (Signed)
NAMEOANH, DEVIVO         ACCOUNT NO.:  [id number]   MEDICAL RECORD NO.:  [id number]          PATIENT TYPE:  AMB   LOCATION:  DAY                          FACILITY:  [facility]   PHYSICIAN:  Leonie Man, M.D.   DATE OF BIRTH:  02/22/1931   DATE OF PROCEDURE:  05/30/2006  DATE OF DISCHARGE:                               OPERATIVE REPORT   PREOPERATIVE DIAGNOSIS:  Bleeding hemorrhoidal disease.   POSTOPERATIVE DIAGNOSIS:  Bleeding anal tumor.   PROCEDURE:  Transanal excision of anal tumor and excision of anal tag.   SURGEON:  Leonie Man, M.D.   ASSISTANT:  OR tech.   ANESTHESIA:  General.   SPECIMENS TO LAB:  Anal mass (adenocarcinoma).   ESTIMATED BLOOD LOSS:  Minimal.   COMPLICATIONS:  None.  Patient returned to the PACU in excellent  condition.   NOTE:  Ashley Davenport is a 77 year old lady who presents with a  complaint of a several month history of bleeding and prolapse of what  she felt to be hemorrhoids.  Each time she has a BM, she does have to  reintroduce her hemorrhoids back into the anal canal.  She has not  been having any significant pain on bowel movements and her history that  she underwent a repair of a fistula in ano back in 1982.  The patient  comes to the operating room now after the risks and potential benefits  of surgery have been fully discussed with her.  All questions answered  and consent obtained.   PROCEDURE:  Following the induction of satisfactory general anesthesia  with the patient in the prone jack-knife position, the perianal tissue  are prepped and draped, to be included in the sterile operative field.  I infiltrated the perianal tissues with 0.5% Marcaine with epinephrine  with Wydase.  The anal canal is dilated up to 3 fingerbreadths, and a  dissecting operating anoscope is placed in.  At approximately 4 cm above  the dentate line, a large villous-appearing tumor was noted.  There was  no significant hemorrhoidal  disease noted.  I infiltrated the base of  the large polyp with Marcaine with epinephrine and starting at the  dentate line, I delineated the margin around the tumor of what appeared  to be normal mucosa and dissecting this through, taking some off the  underlying muscularis.  Dissection was carried out to the most proximal  extent of the tumor, where the tumor was removed and forwarded for  pathologic evaluation.  Frozen section diagnosis was that of a well-  differentiated adenocarcinoma.  The defect within the anal rectum was  enclosed with two layers of running 2-0 Vicryl.  Hemostasis was assured  onto these conditions.  A large prolapsing anal tag was also removed  using electrocautery and closed with 4-0 Vicryl sutures.  Additional  injections of 0.5% Marcaine with epinephrine were used around the anal  verge, and a pack of Gelfoam  was placed in the anus for additional pressure and hemostasis.  Sponge,  instruments, and sharp counts were verified.  The anesthetic reversed.  The patient removed from the operating room to the recovery  room in  stable condition.  She tolerated the procedure well.      Leonie Man, M.D.  Electronically Signed     PB/MEDQ  D:  05/30/2006  T:  05/30/2006  Job:  [id number]   cc:   Margaretmary Bayley, M.D.  Fax: [phone]

## 2010-08-31 ENCOUNTER — Ambulatory Visit: Payer: PRIVATE HEALTH INSURANCE | Admitting: Vascular Surgery

## 2010-08-31 ENCOUNTER — Other Ambulatory Visit: Payer: Medicare Other

## 2010-09-07 ENCOUNTER — Ambulatory Visit
Admission: RE | Admit: 2010-09-07 | Discharge: 2010-09-07 | Disposition: A | Discharge status: Home / Self Care | Payer: Medicare Other | Source: Ambulatory Visit | Attending: Vascular Surgery | Admitting: Vascular Surgery

## 2010-09-07 ENCOUNTER — Ambulatory Visit (INDEPENDENT_AMBULATORY_CARE_PROVIDER_SITE_OTHER): Payer: Medicare Other | Admitting: Vascular Surgery

## 2010-09-07 DIAGNOSIS — I714 Abdominal aortic aneurysm, without rupture: Secondary | ICD-10-CM

## 2010-09-08 NOTE — Assessment & Plan Note (Signed)
OFFICE VISIT  Ashley Davenport, Ashley Davenport DOB:  1931/03/31                                       09/07/2010 EXBMW#:[id number]  This patient returns for continued follow-up regarding a stent graft which I placed in November 2011 for infrarenal abdominal aortic aneurysm using a Gore Excluder graft.  She has done well since her surgery with no abdominal or back symptoms and a good appetite and good energy level. She denies any chest pain, dyspnea on exertion, PND, orthopnea.  She does have some numbness along the medial aspect of the left thigh from the groin distally.  CHRONIC MEDICAL PROBLEMS: 1. Hypertension. 2. Hyperlipidemia. 3. History of rectal cancer. 4. Mild chronic renal insufficiency followed by Dr. Lowell Guitar.  SOCIAL HISTORY:  She is single.  She has 3 children.  She is a retired [employer] fund[employer]. Quit smoking in 1991.  Drinks occasional alcohol.  REVIEW OF SYSTEMS:  Positive for swelling in both ankles but negative for chest pain.  Has occasional mild dyspnea on exertion.  Denies any other symptoms in complete review of systems.  PHYSICAL EXAMINATION:  Blood pressure 151/69, heart rate 90, respirations 18.  General:  Well-developed, well-nourished female in no apparent distress, alert and oriented x3.  HEENT:  Exam normal for age. EOMs intact.  Lungs:  Clear to auscultation.  No rhonchi or wheezing. Cardiovascular:  Regular rhythm, no murmurs.  Carotid pulses 3+,  no audible bruits.  Abdomen:  Obese.  No palpable mass and no pulsatile mass.  Lower extremity exam reveals 3+ femoral and distal pulses.  Today, I reviewed the CT angiogram performed at the hospital, by computer, and the maximum size of the aneurysm is about 48-49 mm, slightly less than last study, similar to the preoperative study.  With no contrast, it is impossible to decipher endoleaks.  Graft seems to be in good position with no migration.  In general, I think she is getting along well.  Will  see her back in 1 year with a duplex scan in our office to monitor the size of the aneurysm and potential endoleaks.    [name], M.D. Electronically Signed  JDL/MEDQ  D:  09/07/2010  T:  09/08/2010  Job:  [id number]

## 2010-09-15 ENCOUNTER — Other Ambulatory Visit: Payer: Self-pay | Admitting: Hematology and Oncology

## 2010-09-15 ENCOUNTER — Encounter (HOSPITAL_BASED_OUTPATIENT_CLINIC_OR_DEPARTMENT_OTHER): Payer: Medicare Other | Admitting: Hematology and Oncology

## 2010-09-15 DIAGNOSIS — C20 Malignant neoplasm of rectum: Secondary | ICD-10-CM

## 2010-09-15 LAB — CBC WITH DIFFERENTIAL/PLATELET
Basophils Absolute: 0 10*3/uL (ref 0.0–0.1)
Eosinophils Absolute: 0.1 10*3/uL (ref 0.0–0.5)
HCT: 34.4 % — ABNORMAL LOW (ref 34.8–46.6)
HGB: 11.5 g/dL — ABNORMAL LOW (ref 11.6–15.9)
LYMPH%: 12.3 % — ABNORMAL LOW (ref 14.0–49.7)
MCV: 87.6 fL (ref 79.5–101.0)
MONO%: 10.2 % (ref 0.0–14.0)
NEUT#: 6.1 10*3/uL (ref 1.5–6.5)
Platelets: 435 10*3/uL — ABNORMAL HIGH (ref 145–400)
RDW: 17 % — ABNORMAL HIGH (ref 11.2–14.5)

## 2010-10-13 ENCOUNTER — Encounter (HOSPITAL_BASED_OUTPATIENT_CLINIC_OR_DEPARTMENT_OTHER): Payer: Medicare Other | Admitting: Hematology and Oncology

## 2010-10-13 ENCOUNTER — Other Ambulatory Visit: Payer: Self-pay | Admitting: Hematology and Oncology

## 2010-10-13 DIAGNOSIS — C20 Malignant neoplasm of rectum: Secondary | ICD-10-CM

## 2010-10-13 LAB — CBC WITH DIFFERENTIAL/PLATELET
Eosinophils Absolute: 0.1 10*3/uL (ref 0.0–0.5)
LYMPH%: 14.9 % (ref 14.0–49.7)
MCV: 88.5 fL (ref 79.5–101.0)
MONO%: 9.3 % (ref 0.0–14.0)
NEUT#: 3.9 10*3/uL (ref 1.5–6.5)
Platelets: 472 10*3/uL — ABNORMAL HIGH (ref 145–400)
RBC: 3.81 10*6/uL (ref 3.70–5.45)

## 2010-11-10 ENCOUNTER — Encounter (HOSPITAL_BASED_OUTPATIENT_CLINIC_OR_DEPARTMENT_OTHER): Payer: Medicare Other | Admitting: Hematology and Oncology

## 2010-11-10 ENCOUNTER — Other Ambulatory Visit: Payer: Self-pay | Admitting: Hematology and Oncology

## 2010-11-10 DIAGNOSIS — C20 Malignant neoplasm of rectum: Secondary | ICD-10-CM

## 2010-11-10 LAB — CBC WITH DIFFERENTIAL/PLATELET
BASO%: 0.4 % (ref 0.0–2.0)
EOS%: 4.5 % (ref 0.0–7.0)
LYMPH%: 15.1 % (ref 14.0–49.7)
MCH: 30.3 pg (ref 25.1–34.0)
MCHC: 34.2 g/dL (ref 31.5–36.0)
MCV: 88.8 fL (ref 79.5–101.0)
MONO#: 0.3 10*3/uL (ref 0.1–0.9)
MONO%: 6.7 % (ref 0.0–14.0)
Platelets: 367 10*3/uL (ref 145–400)
RBC: 3.9 10*6/uL (ref 3.70–5.45)
WBC: 5.1 10*3/uL (ref 3.9–10.3)

## 2010-12-08 ENCOUNTER — Encounter (HOSPITAL_BASED_OUTPATIENT_CLINIC_OR_DEPARTMENT_OTHER): Payer: Medicare Other | Admitting: Hematology and Oncology

## 2010-12-08 ENCOUNTER — Other Ambulatory Visit: Payer: Self-pay | Admitting: Hematology and Oncology

## 2010-12-08 ENCOUNTER — Other Ambulatory Visit: Payer: Self-pay | Admitting: Internal Medicine

## 2010-12-08 DIAGNOSIS — D638 Anemia in other chronic diseases classified elsewhere: Secondary | ICD-10-CM

## 2010-12-08 DIAGNOSIS — Z1231 Encounter for screening mammogram for malignant neoplasm of breast: Secondary | ICD-10-CM

## 2010-12-08 DIAGNOSIS — C20 Malignant neoplasm of rectum: Secondary | ICD-10-CM

## 2010-12-08 DIAGNOSIS — N289 Disorder of kidney and ureter, unspecified: Secondary | ICD-10-CM

## 2010-12-08 LAB — CBC WITH DIFFERENTIAL/PLATELET
Basophils Absolute: 0 10*3/uL (ref 0.0–0.1)
Eosinophils Absolute: 0.2 10*3/uL (ref 0.0–0.5)
HCT: 30.5 % — ABNORMAL LOW (ref 34.8–46.6)
HGB: 10.5 g/dL — ABNORMAL LOW (ref 11.6–15.9)
MCH: 30.7 pg (ref 25.1–34.0)
MONO#: 0.5 10*3/uL (ref 0.1–0.9)
NEUT#: 3.9 10*3/uL (ref 1.5–6.5)
NEUT%: 73.5 % (ref 38.4–76.8)
RDW: 16 % — ABNORMAL HIGH (ref 11.2–14.5)
WBC: 5.3 10*3/uL (ref 3.9–10.3)
lymph#: 0.8 10*3/uL — ABNORMAL LOW (ref 0.9–3.3)

## 2010-12-28 ENCOUNTER — Ambulatory Visit
Admission: RE | Admit: 2010-12-28 | Discharge: 2010-12-28 | Disposition: A | Discharge status: Home / Self Care | Payer: Medicare Other | Source: Ambulatory Visit | Attending: Internal Medicine | Admitting: Internal Medicine

## 2010-12-28 DIAGNOSIS — Z1231 Encounter for screening mammogram for malignant neoplasm of breast: Secondary | ICD-10-CM

## 2011-01-05 ENCOUNTER — Other Ambulatory Visit: Payer: Self-pay | Admitting: Hematology and Oncology

## 2011-01-05 ENCOUNTER — Encounter (HOSPITAL_BASED_OUTPATIENT_CLINIC_OR_DEPARTMENT_OTHER): Payer: Medicare Other | Admitting: Hematology and Oncology

## 2011-01-05 DIAGNOSIS — C20 Malignant neoplasm of rectum: Secondary | ICD-10-CM

## 2011-01-05 LAB — CBC WITH DIFFERENTIAL/PLATELET
Basophils Absolute: 0 10*3/uL (ref 0.0–0.1)
EOS%: 4 % (ref 0.0–7.0)
Eosinophils Absolute: 0.2 10*3/uL (ref 0.0–0.5)
HCT: 35.1 % (ref 34.8–46.6)
HGB: 11.8 g/dL (ref 11.6–15.9)
MCH: 30.8 pg (ref 25.1–34.0)
MCV: 91.5 fL (ref 79.5–101.0)
MONO%: 7.5 % (ref 0.0–14.0)
NEUT#: 4.1 10*3/uL (ref 1.5–6.5)
NEUT%: 71.8 % (ref 38.4–76.8)
Platelets: 371 10*3/uL (ref 145–400)

## 2011-01-28 ENCOUNTER — Encounter: Payer: Self-pay | Admitting: Nurse Practitioner

## 2011-02-02 ENCOUNTER — Encounter (HOSPITAL_BASED_OUTPATIENT_CLINIC_OR_DEPARTMENT_OTHER): Payer: Medicare Other | Admitting: Hematology and Oncology

## 2011-02-02 ENCOUNTER — Other Ambulatory Visit: Payer: Self-pay | Admitting: Hematology and Oncology

## 2011-02-02 ENCOUNTER — Ambulatory Visit (HOSPITAL_COMMUNITY)
Admission: RE | Admit: 2011-02-02 | Discharge: 2011-02-02 | Disposition: A | Discharge status: Home / Self Care | Payer: Medicare Other | Source: Ambulatory Visit | Attending: Hematology and Oncology | Admitting: Hematology and Oncology

## 2011-02-02 DIAGNOSIS — D638 Anemia in other chronic diseases classified elsewhere: Secondary | ICD-10-CM

## 2011-02-02 DIAGNOSIS — N289 Disorder of kidney and ureter, unspecified: Secondary | ICD-10-CM

## 2011-02-02 DIAGNOSIS — K7689 Other specified diseases of liver: Secondary | ICD-10-CM | POA: Insufficient documentation

## 2011-02-02 DIAGNOSIS — C20 Malignant neoplasm of rectum: Secondary | ICD-10-CM

## 2011-02-02 DIAGNOSIS — N281 Cyst of kidney, acquired: Secondary | ICD-10-CM | POA: Insufficient documentation

## 2011-02-02 DIAGNOSIS — K5730 Diverticulosis of large intestine without perforation or abscess without bleeding: Secondary | ICD-10-CM | POA: Insufficient documentation

## 2011-02-02 DIAGNOSIS — D631 Anemia in chronic kidney disease: Secondary | ICD-10-CM

## 2011-02-02 DIAGNOSIS — N189 Chronic kidney disease, unspecified: Secondary | ICD-10-CM

## 2011-02-02 LAB — CBC WITH DIFFERENTIAL/PLATELET
BASO%: 0.3 % (ref 0.0–2.0)
EOS%: 5.1 % (ref 0.0–7.0)
HCT: 38.6 % (ref 34.8–46.6)
MCH: 30 pg (ref 25.1–34.0)
MCHC: 33.4 g/dL (ref 31.5–36.0)
NEUT%: 70.1 % (ref 38.4–76.8)
RBC: 4.29 10*6/uL (ref 3.70–5.45)
WBC: 6.1 10*3/uL (ref 3.9–10.3)
lymph#: 1 10*3/uL (ref 0.9–3.3)

## 2011-02-02 LAB — COMPREHENSIVE METABOLIC PANEL
AST: 24 U/L (ref 0–37)
Alkaline Phosphatase: 50 U/L (ref 39–117)
BUN: 30 mg/dL — ABNORMAL HIGH (ref 6–23)
Creatinine, Ser: 2.48 mg/dL — ABNORMAL HIGH (ref 0.50–1.10)
Glucose, Bld: 101 mg/dL — ABNORMAL HIGH (ref 70–99)
Total Bilirubin: 0.3 mg/dL (ref 0.3–1.2)

## 2011-02-08 ENCOUNTER — Other Ambulatory Visit: Payer: Medicare Other | Admitting: Lab

## 2011-02-08 ENCOUNTER — Telehealth: Payer: Self-pay | Admitting: Hematology and Oncology

## 2011-02-08 ENCOUNTER — Telehealth: Payer: Self-pay | Admitting: *Deleted

## 2011-02-08 ENCOUNTER — Ambulatory Visit (HOSPITAL_BASED_OUTPATIENT_CLINIC_OR_DEPARTMENT_OTHER): Payer: Medicare Other | Admitting: Physician Assistant

## 2011-02-08 DIAGNOSIS — R079 Chest pain, unspecified: Secondary | ICD-10-CM

## 2011-02-08 DIAGNOSIS — B0223 Postherpetic polyneuropathy: Secondary | ICD-10-CM

## 2011-02-08 DIAGNOSIS — D631 Anemia in chronic kidney disease: Secondary | ICD-10-CM

## 2011-02-08 DIAGNOSIS — N039 Chronic nephritic syndrome with unspecified morphologic changes: Secondary | ICD-10-CM

## 2011-02-08 DIAGNOSIS — C20 Malignant neoplasm of rectum: Secondary | ICD-10-CM

## 2011-02-08 DIAGNOSIS — N189 Chronic kidney disease, unspecified: Secondary | ICD-10-CM

## 2011-02-08 NOTE — Telephone Encounter (Signed)
Received call from [facility] @ Dr. Claria Dice' office re:   Pt is scheduled for colonoscopy on  03/01/11.    Marchelle Folks had informed pt of above info.

## 2011-02-08 NOTE — Progress Notes (Signed)
CC:   Alvin C. Lowell Guitar, M.D. Margaretmary Bayley, M.D. James L. Malon Kindle., M.D. [name], M.D. Timothy P. Fontaine, M.D. Kathrin Penner. Vear Clock, M.D.  IDENTIFYING STATEMENT:  Ms. 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  INTERIM HISTORY:  Ms. Smethurst reports since her last clinic visit she has had overall normal energy level.  No fevers, chills, or night sweats.  No dyspnea or cough.  She has normal appetite and has had no issues with nausea, vomiting, constipation, or diarrhea.  No dysuria, no frequency, or hematuria.  No alteration in balance or swelling of extremities.  She does continue to report issues with pain along her anterior left chest at the site of previous shingles and she is [facility]ear [facility].  She states that they are considering possibly a nerve block as she has had poor pain control with oral medications at this time.  She has had no problems with swelling of extremities.  Of note, the patient states that she plans to travel in February to [place].  PHYSICAL EXAMINATION:  Temperature is 97.1, heart rate 70, respirations 20, blood pressure 135/71, weight 191.9 pounds.  General:  This is a well-developed, well-nourished, black female in no acute distress. HEENT:  Sclerae nonicteric.  There is no oral thrush or mucositis. Skin:  Without rashes or lesion.  She does have areas of hyperpigmentation noted left anterior chest at site of previous shingles.  Lymph:  No cervical, supraclavicular, axillary or inguinal lymphadenopathy.  Cardiac:  Regular rate and rhythm without murmurs or gallops.  Peripheral pulses are 2+.  Chest:  Lungs clear to auscultation.  Abdomen:  Positive bowel sounds.  Soft, nontender, nondistended.  No organomegaly.  Extremities:  Without edema,  cyanosis or calf tenderness.  Neurologic:  Alert and oriented times 3.  Strength, sensation, and coordination all grossly intact.  LABORATORY DATA:  Laboratory data from February 02, 2011 CBC with diff reveals white blood count of 6.1, hemoglobin 12.9, hematocrit 38.6, platelets of 455, ANC of 4.3 and MCV of 89.6.  Chemistries reveal a sodium of 136, potassium 4.5, chloride 100, BUN of 30, creatinine 2.48, glucose of 101, bilirubin 0.3, alkaline phosphatase 50, AST 24, ALT 13, total protein 7.2, albumin 4.2 and calcium of 9.9.  Tumor marker CEA of 7.8.  RADIOGRAPHIC STUDIES:  From February 02, 2011 CT of the chest, abdomen, and pelvis without contrast in the chest revealed no evidence of thoracic metastasis.  In the abdomen and pelvis, no evidence of metastasis in the abdomen or pelvis.  Sigmoid diverticulosis without diverticulitis.  IMPRESSION/PLAN:  Ashley Davenport is a 76 year old black female with: 1. Rectal cancer status post transanal excision for a T3 N0 moderately     differentiated adenocarcinoma of the rectum with evidence of     lymphovascular invasion and involvement of surgical margins on     May 30, 2006.  She is status post neoadjuvant continuous     infusion 5-FU with external radiation therapy between July 10, 2006     and Sep 01, 2006.  She declined abdominoperineal resection.  She     went on to receive 3 additional cycles of Xeloda, but developed     hand-foot syndrome and declined further therapy. 2. The patient's most recent radiographic studies and labs are as     above.  Although  her CT scan does not reveal any evidence of     recurrent disease, her tumor marker is elevated at 7.8, this is her     CEA.  Dr. Dalene Carrow has reviewed the patient's labs and scans and per     Dr. Dalene Carrow since the patient's last colonoscopy was in March of     2010 with recommendation for repeat colonoscopy in March of 2013,     we will contact Dr. Carman Ching office to see  if the patient may     need her colonoscopy moved up to a sooner date. 3. We will plan to reassess CEA in 1 month's time and the patient will     be called with results. 4. Anemia of chronic disease secondary to renal insufficiency.  She     continues to receive Aranesp 300 mcg subcu every month for     hemoglobin less than 11. 5. Patient with ongoing neuropathic pain secondary to history of     shingles managed [facility]ear [facility]. 6. Per Dr. Dalene Carrow, the patient be scheduled for followup in 6 months'     time.  A few days before this, we will reassess CBC with diff, CMET     and CEA.  The patient is advised to call in the interim if any     questions or problems.    ______________________________ Sherilyn Banker, MSN, ANP, BC RJ/MEDQ  D:  02/08/2011  T:  02/08/2011  Job:  [id number]

## 2011-02-08 NOTE — Telephone Encounter (Signed)
gve the pt her nov-may 2013 appt calendar.

## 2011-02-08 NOTE — Telephone Encounter (Signed)
Spoke with Marchelle Folks @ Dr. Claria Dice office.   Asked Marchelle Folks to forward pt's labs and CT scan results for md to review ,  And perhaps Dr. Randa Evens could move pt's  Upcoming colonoscopy to an earlier date and time due to pt's abnormal  CEA level.     Faxed results to [facility].    Marchelle Folks stated she would contact pt with Dr. Randa Evens' decision , and also would let our office know as well. Amanda's    Fax     [phone].

## 2011-02-08 NOTE — Progress Notes (Signed)
This office note has been dictated.

## 2011-03-01 ENCOUNTER — Other Ambulatory Visit: Payer: Self-pay | Admitting: Hematology and Oncology

## 2011-03-01 ENCOUNTER — Other Ambulatory Visit: Payer: Self-pay | Admitting: Gastroenterology

## 2011-03-01 ENCOUNTER — Other Ambulatory Visit: Payer: Self-pay | Admitting: *Deleted

## 2011-03-01 DIAGNOSIS — C20 Malignant neoplasm of rectum: Secondary | ICD-10-CM

## 2011-03-01 DIAGNOSIS — D631 Anemia in chronic kidney disease: Secondary | ICD-10-CM

## 2011-03-02 ENCOUNTER — Ambulatory Visit: Payer: Medicare Other

## 2011-03-02 ENCOUNTER — Other Ambulatory Visit (HOSPITAL_BASED_OUTPATIENT_CLINIC_OR_DEPARTMENT_OTHER): Payer: Medicare Other

## 2011-03-02 DIAGNOSIS — N189 Chronic kidney disease, unspecified: Secondary | ICD-10-CM

## 2011-03-02 DIAGNOSIS — C20 Malignant neoplasm of rectum: Secondary | ICD-10-CM

## 2011-03-02 DIAGNOSIS — D631 Anemia in chronic kidney disease: Secondary | ICD-10-CM

## 2011-03-02 DIAGNOSIS — N039 Chronic nephritic syndrome with unspecified morphologic changes: Secondary | ICD-10-CM

## 2011-03-02 LAB — CBC WITH DIFFERENTIAL/PLATELET
Basophils Absolute: 0 10*3/uL (ref 0.0–0.1)
Eosinophils Absolute: 0.1 10*3/uL (ref 0.0–0.5)
HGB: 12.1 g/dL (ref 11.6–15.9)
LYMPH%: 9.5 % — ABNORMAL LOW (ref 14.0–49.7)
MCV: 90.2 fL (ref 79.5–101.0)
MONO#: 0.5 10*3/uL (ref 0.1–0.9)
MONO%: 12.2 % (ref 0.0–14.0)
NEUT#: 3.2 10*3/uL (ref 1.5–6.5)
Platelets: 365 10*3/uL (ref 145–400)
WBC: 4.3 10*3/uL (ref 3.9–10.3)

## 2011-03-02 MED ORDER — DARBEPOETIN ALFA-POLYSORBATE 500 MCG/ML IJ SOLN: 300.0000 ug | Freq: Once | INTRAMUSCULAR | Status: DC

## 2011-03-08 ENCOUNTER — Other Ambulatory Visit: Payer: Medicare Other | Admitting: Lab

## 2011-03-30 ENCOUNTER — Other Ambulatory Visit: Payer: Self-pay | Admitting: Physician Assistant

## 2011-03-30 ENCOUNTER — Other Ambulatory Visit (HOSPITAL_BASED_OUTPATIENT_CLINIC_OR_DEPARTMENT_OTHER): Payer: Medicare Other | Admitting: Lab

## 2011-03-30 ENCOUNTER — Ambulatory Visit: Payer: Medicare Other

## 2011-03-30 DIAGNOSIS — C20 Malignant neoplasm of rectum: Secondary | ICD-10-CM

## 2011-03-30 DIAGNOSIS — D631 Anemia in chronic kidney disease: Secondary | ICD-10-CM

## 2011-03-30 DIAGNOSIS — N189 Chronic kidney disease, unspecified: Secondary | ICD-10-CM

## 2011-03-30 LAB — CBC WITH DIFFERENTIAL/PLATELET
BASO%: 0.3 % (ref 0.0–2.0)
EOS%: 3.9 % (ref 0.0–7.0)
HCT: 34.5 % — ABNORMAL LOW (ref 34.8–46.6)
LYMPH%: 11.5 % — ABNORMAL LOW (ref 14.0–49.7)
MCH: 30.5 pg (ref 25.1–34.0)
MCHC: 33.5 g/dL (ref 31.5–36.0)
NEUT%: 76.2 % (ref 38.4–76.8)
Platelets: 360 10*3/uL (ref 145–400)
RBC: 3.79 10*6/uL (ref 3.70–5.45)
WBC: 5.3 10*3/uL (ref 3.9–10.3)

## 2011-03-30 LAB — COMPREHENSIVE METABOLIC PANEL
AST: 22 U/L (ref 0–37)
Albumin: 3.9 g/dL (ref 3.5–5.2)
Alkaline Phosphatase: 47 U/L (ref 39–117)
BUN: 35 mg/dL — ABNORMAL HIGH (ref 6–23)
Glucose, Bld: 131 mg/dL — ABNORMAL HIGH (ref 70–99)
Potassium: 4.2 mEq/L (ref 3.5–5.3)
Sodium: 137 mEq/L (ref 135–145)
Total Bilirubin: 0.3 mg/dL (ref 0.3–1.2)
Total Protein: 7.2 g/dL (ref 6.0–8.3)

## 2011-03-30 LAB — CEA: CEA: 4.4 ng/mL (ref 0.0–5.0)

## 2011-03-30 MED ORDER — DARBEPOETIN ALFA-POLYSORBATE 500 MCG/ML IJ SOLN: 300.0000 ug | Freq: Once | INTRAMUSCULAR | Status: DC

## 2011-04-27 ENCOUNTER — Ambulatory Visit: Payer: Medicare Other

## 2011-04-27 ENCOUNTER — Other Ambulatory Visit (HOSPITAL_BASED_OUTPATIENT_CLINIC_OR_DEPARTMENT_OTHER): Payer: Medicare Other | Admitting: Lab

## 2011-04-27 DIAGNOSIS — C20 Malignant neoplasm of rectum: Secondary | ICD-10-CM

## 2011-04-27 DIAGNOSIS — D631 Anemia in chronic kidney disease: Secondary | ICD-10-CM

## 2011-04-27 LAB — CBC WITH DIFFERENTIAL/PLATELET
BASO%: 0.5 % (ref 0.0–2.0)
EOS%: 4.4 % (ref 0.0–7.0)
MCH: 30.5 pg (ref 25.1–34.0)
MCHC: 33.6 g/dL (ref 31.5–36.0)
MCV: 90.8 fL (ref 79.5–101.0)
MONO%: 8.3 % (ref 0.0–14.0)
RBC: 3.95 10*6/uL (ref 3.70–5.45)
RDW: 15.7 % — ABNORMAL HIGH (ref 11.2–14.5)
lymph#: 1 10*3/uL (ref 0.9–3.3)

## 2011-04-27 MED ORDER — DARBEPOETIN ALFA-POLYSORBATE 500 MCG/ML IJ SOLN: 300.0000 ug | Freq: Once | INTRAMUSCULAR | Status: DC

## 2011-05-10 ENCOUNTER — Other Ambulatory Visit: Payer: Medicare Other | Admitting: Lab

## 2011-06-01 ENCOUNTER — Other Ambulatory Visit: Payer: Medicare Other | Admitting: Lab

## 2011-06-01 ENCOUNTER — Ambulatory Visit (HOSPITAL_BASED_OUTPATIENT_CLINIC_OR_DEPARTMENT_OTHER): Payer: Medicare Other

## 2011-06-01 DIAGNOSIS — D649 Anemia, unspecified: Secondary | ICD-10-CM

## 2011-06-01 DIAGNOSIS — C20 Malignant neoplasm of rectum: Secondary | ICD-10-CM

## 2011-06-01 DIAGNOSIS — N189 Chronic kidney disease, unspecified: Secondary | ICD-10-CM

## 2011-06-01 LAB — CBC WITH DIFFERENTIAL/PLATELET
Eosinophils Absolute: 0.3 10*3/uL (ref 0.0–0.5)
HGB: 11.5 g/dL — ABNORMAL LOW (ref 11.6–15.9)
MCV: 88.7 fL (ref 79.5–101.0)
MONO#: 0.4 10*3/uL (ref 0.1–0.9)
MONO%: 6 % (ref 0.0–14.0)
NEUT#: 5.4 10*3/uL (ref 1.5–6.5)
RBC: 3.89 10*6/uL (ref 3.70–5.45)
RDW: 14.8 % — ABNORMAL HIGH (ref 11.2–14.5)
WBC: 7.2 10*3/uL (ref 3.9–10.3)
lymph#: 1.1 10*3/uL (ref 0.9–3.3)

## 2011-06-01 NOTE — Progress Notes (Signed)
Pt entered center today for Labs and possible Aranesp Injection. Labs were noted at Hemoglobin of 11.5 and Hematocrit of 34.5. Injection held per orders. Pt  Instructed to  Keep scheduled appointments and to call for issues. Pt verbalized  Instructions.

## 2011-06-07 ENCOUNTER — Other Ambulatory Visit: Payer: Medicare Other | Admitting: Lab

## 2011-06-29 ENCOUNTER — Other Ambulatory Visit (HOSPITAL_BASED_OUTPATIENT_CLINIC_OR_DEPARTMENT_OTHER): Payer: Medicare Other | Admitting: Lab

## 2011-06-29 ENCOUNTER — Ambulatory Visit: Payer: Medicare Other

## 2011-06-29 DIAGNOSIS — D631 Anemia in chronic kidney disease: Secondary | ICD-10-CM

## 2011-06-29 DIAGNOSIS — C20 Malignant neoplasm of rectum: Secondary | ICD-10-CM

## 2011-06-29 LAB — CBC WITH DIFFERENTIAL/PLATELET
BASO%: 0.2 % (ref 0.0–2.0)
EOS%: 3.2 % (ref 0.0–7.0)
HCT: 33.6 % — ABNORMAL LOW (ref 34.8–46.6)
LYMPH%: 12.6 % — ABNORMAL LOW (ref 14.0–49.7)
MCH: 29.3 pg (ref 25.1–34.0)
MCHC: 32.7 g/dL (ref 31.5–36.0)
MCV: 89.6 fL (ref 79.5–101.0)
MONO%: 6.8 % (ref 0.0–14.0)
NEUT%: 77.2 % — ABNORMAL HIGH (ref 38.4–76.8)
Platelets: 477 10*3/uL — ABNORMAL HIGH (ref 145–400)

## 2011-06-29 NOTE — Progress Notes (Signed)
1130-Pt with Hgb of 11.0 and Hct of 33.6 from today's CBC.  Aranesp not given as ordered per Dr. Lonell Face parameters for injection.  Pt without complaints or questions at this time and aware of next scheduled appointment.

## 2011-07-05 ENCOUNTER — Other Ambulatory Visit: Payer: Medicare Other | Admitting: Lab

## 2011-07-27 ENCOUNTER — Ambulatory Visit: Payer: Medicare Other

## 2011-07-27 ENCOUNTER — Other Ambulatory Visit (HOSPITAL_BASED_OUTPATIENT_CLINIC_OR_DEPARTMENT_OTHER): Payer: Medicare Other | Admitting: Lab

## 2011-07-27 DIAGNOSIS — C20 Malignant neoplasm of rectum: Secondary | ICD-10-CM

## 2011-07-27 DIAGNOSIS — N189 Chronic kidney disease, unspecified: Secondary | ICD-10-CM

## 2011-07-27 DIAGNOSIS — D631 Anemia in chronic kidney disease: Secondary | ICD-10-CM

## 2011-07-27 DIAGNOSIS — N039 Chronic nephritic syndrome with unspecified morphologic changes: Secondary | ICD-10-CM

## 2011-07-27 LAB — CBC WITH DIFFERENTIAL/PLATELET
Basophils Absolute: 0 10*3/uL (ref 0.0–0.1)
EOS%: 3.5 % (ref 0.0–7.0)
HGB: 11.4 g/dL — ABNORMAL LOW (ref 11.6–15.9)
MCH: 30.4 pg (ref 25.1–34.0)
MCV: 91.5 fL (ref 79.5–101.0)
MONO%: 10.2 % (ref 0.0–14.0)
RBC: 3.76 10*6/uL (ref 3.70–5.45)
RDW: 15.9 % — ABNORMAL HIGH (ref 11.2–14.5)

## 2011-07-27 MED ORDER — DARBEPOETIN ALFA-POLYSORBATE 500 MCG/ML IJ SOLN: 300.0000 ug | Freq: Once | INTRAMUSCULAR | Status: DC

## 2011-08-24 ENCOUNTER — Other Ambulatory Visit (HOSPITAL_BASED_OUTPATIENT_CLINIC_OR_DEPARTMENT_OTHER): Payer: Medicare Other | Admitting: Lab

## 2011-08-24 ENCOUNTER — Ambulatory Visit: Payer: Medicare Other

## 2011-08-24 DIAGNOSIS — C20 Malignant neoplasm of rectum: Secondary | ICD-10-CM

## 2011-08-24 DIAGNOSIS — D631 Anemia in chronic kidney disease: Secondary | ICD-10-CM

## 2011-08-24 LAB — CBC WITH DIFFERENTIAL/PLATELET
BASO%: 0.4 % (ref 0.0–2.0)
EOS%: 0.5 % (ref 0.0–7.0)
LYMPH%: 11 % — ABNORMAL LOW (ref 14.0–49.7)
MCHC: 33.7 g/dL (ref 31.5–36.0)
MONO#: 0.5 10*3/uL (ref 0.1–0.9)
MONO%: 7.4 % (ref 0.0–14.0)
Platelets: 391 10*3/uL (ref 145–400)
RBC: 3.63 10*6/uL — ABNORMAL LOW (ref 3.70–5.45)
WBC: 7 10*3/uL (ref 3.9–10.3)
nRBC: 0 % (ref 0–0)

## 2011-08-24 MED ORDER — DARBEPOETIN ALFA-POLYSORBATE 500 MCG/ML IJ SOLN: 300.0000 ug | Freq: Once | INTRAMUSCULAR | Status: DC

## 2011-08-30 ENCOUNTER — Ambulatory Visit (HOSPITAL_BASED_OUTPATIENT_CLINIC_OR_DEPARTMENT_OTHER): Payer: Medicare Other | Admitting: Hematology and Oncology

## 2011-08-30 ENCOUNTER — Telehealth: Payer: Self-pay | Admitting: Hematology and Oncology

## 2011-08-30 ENCOUNTER — Encounter: Payer: Self-pay | Admitting: Hematology and Oncology

## 2011-08-30 ENCOUNTER — Ambulatory Visit (HOSPITAL_BASED_OUTPATIENT_CLINIC_OR_DEPARTMENT_OTHER): Payer: Medicare Other | Admitting: Lab

## 2011-08-30 VITALS — BP 141/92 | HR 90 | Temp 97.7°F | Ht 64.0 in | Wt 189.4 lb

## 2011-08-30 DIAGNOSIS — N039 Chronic nephritic syndrome with unspecified morphologic changes: Secondary | ICD-10-CM

## 2011-08-30 DIAGNOSIS — D649 Anemia, unspecified: Secondary | ICD-10-CM

## 2011-08-30 DIAGNOSIS — C20 Malignant neoplasm of rectum: Secondary | ICD-10-CM

## 2011-08-30 DIAGNOSIS — D631 Anemia in chronic kidney disease: Secondary | ICD-10-CM

## 2011-08-30 LAB — COMPREHENSIVE METABOLIC PANEL
Albumin: 4.1 g/dL (ref 3.5–5.2)
BUN: 43 mg/dL — ABNORMAL HIGH (ref 6–23)
CO2: 26 mEq/L (ref 19–32)
Calcium: 9 mg/dL (ref 8.4–10.5)
Chloride: 103 mEq/L (ref 96–112)
Creatinine, Ser: 2.1 mg/dL — ABNORMAL HIGH (ref 0.50–1.10)

## 2011-08-30 NOTE — Progress Notes (Signed)
This office note has been dictated.

## 2011-08-30 NOTE — Patient Instructions (Signed)
Ashley Davenport  [id number]  [facility] Discharge Instructions  RECOMMENDATIONS MADE BY THE CONSULTANT AND ANY TEST RESULTS WILL BE SENT TO YOUR REFERRING DOCTOR.   EXAM FINDINGS BY MD TODAY AND SIGNS AND SYMPTOMS TO REPORT TO CLINIC OR PRIMARY MD:   Your current list of medications are: Current Outpatient Prescriptions  Medication Sig Dispense Refill  . amitriptyline (ELAVIL) 10 MG tablet Take 10 mg by mouth at bedtime.      Marland Kitchen amLODipine (NORVASC) 5 MG tablet Take 5 mg by mouth daily.      . Coenzyme Q10 (COQ-10) 200 MG CAPS Take 200 mg by mouth daily.      . ferrous sulfate 325 (65 FE) MG tablet Take 325 mg by mouth 3 (three) times daily with meals.      . furosemide (LASIX) 20 MG tablet Take 20 mg by mouth daily.      Marland Kitchen levothyroxine (SYNTHROID, LEVOTHROID) 75 MCG tablet Take 75 mcg by mouth daily.      . valsartan (DIOVAN) 160 MG tablet Take 160 mg by mouth daily.         INSTRUCTIONS GIVEN AND DISCUSSED:   SPECIAL INSTRUCTIONS/FOLLOW-UP:  See above.  I acknowledge that I have been informed and understand all the instructions given to me and received a copy. I do not have any more questions at this time, but understand that I may call the [facility] at [phone] during business hours should I have any further questions or need assistance in obtaining follow-up care.

## 2011-08-30 NOTE — Telephone Encounter (Signed)
Appts made and printed for pt  aom 

## 2011-08-31 NOTE — Progress Notes (Signed)
CC:   Alvin C. Lowell Guitar, M.D. Margaretmary Bayley, M.D. James L. Malon Kindle., M.D. Timothy P. Fontaine, M.D. Kathrin Penner. Vear Clock, M.D. [name], M.D.  IDENTIFYING STATEMENT:  The patient is a 78 year old woman with rectal cancer and anemia of chronic disease secondary to kidney disease who presents for followup.  INTERVAL HISTORY:  Mrs. Ackert was seen 6 months ago.  She has had no issues or concerns since her last visit.  Her weight is stable.  Besides pain secondary to neuralgia from shingles, she has no abnormal discomfort/pain.  She has had no change in bowel function.  She did have a colonoscopy on 03/01/2011 which showed no high-grade dysplasia or malignancy.  She has recently visited [place] and [place].  She enjoyed both places.  She has good energy levels.  MEDICATIONS:  Reviewed and updated.  PHYSICAL EXAM:  The patient is a well-appearing, well-nourished woman in no distress.  Vitals:  Pulse 90, blood pressure 141/92, temperature 97.7, respirations 18, weight 189.4 pounds.  HEENT:  Head is atraumatic, normocephalic.  Sclerae anicteric.  Mouth moist.  Neck:  Supple.  Chest: Clear.  Cardiovascular:  Unremarkable.  Abdomen:  Soft, nontender. Bowel sounds present.  Extremities:  No calf tenderness or edema.  LAB DATA:  08/24/2011 white cell count 7, hemoglobin 11.2 hematocrit 33.2, platelets 291.  CEA 5.4.  CMET pending.  IMPRESSION AND PLAN:  Mrs. Risse is a 78 year old woman with: 1. 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     She is status post neoadjuvant continuous infusion 5-FU with     external radiation therapy between July 10, 2006 and Sep 01, 2006.     She declined abdominoperineal resection.  She went on to receive     additional 3 cycles of Xeloda, but developed hand-foot syndrome and     declined further therapy.  Her most recent colonoscopy performed 6  months ago was unremarkable. She follows up in a year's time with scans or     sooner if needed. 2. Anemia of chronic disease with renal insufficiency.  Has not    required Aranesp for 6 months now.  So with this said, she repeats labs in      6 months.  She also sees Dr. Casimiro Needle.    ______________________________ Laurice Record, M.D. LIO/MEDQ  D:  08/30/2011  T:  08/30/2011  Job:  [id number]

## 2011-09-12 ENCOUNTER — Encounter: Payer: Self-pay | Admitting: Neurosurgery

## 2011-09-13 ENCOUNTER — Ambulatory Visit (INDEPENDENT_AMBULATORY_CARE_PROVIDER_SITE_OTHER): Payer: Medicare Other | Admitting: Neurosurgery

## 2011-09-13 ENCOUNTER — Ambulatory Visit (INDEPENDENT_AMBULATORY_CARE_PROVIDER_SITE_OTHER): Payer: Medicare Other | Admitting: *Deleted

## 2011-09-13 ENCOUNTER — Ambulatory Visit: Payer: Medicare Other | Admitting: Vascular Surgery

## 2011-09-13 ENCOUNTER — Encounter: Payer: Self-pay | Admitting: Neurosurgery

## 2011-09-13 VITALS — BP 164/94 | HR 69 | Resp 16 | Ht 64.0 in | Wt 190.2 lb

## 2011-09-13 DIAGNOSIS — I714 Abdominal aortic aneurysm, without rupture, unspecified: Secondary | ICD-10-CM | POA: Insufficient documentation

## 2011-09-13 DIAGNOSIS — Z48812 Encounter for surgical aftercare following surgery on the circulatory system: Secondary | ICD-10-CM

## 2011-09-13 NOTE — Progress Notes (Signed)
[facility] HISTORY AND PHYSICAL   CC: Six-month AAA duplex status post EVAR repair Referring Physician: Hart Rochester  History of Present Illness: 78 year old female patient of Dr. Candie Chroman that had EVAR repair of a known AAA and November of 2011. Since that time the patient's been asymptomatic, she has no complaints of abdominal or back pain. Patient is eating well, she states she feels well and has no other vascular complaints.  Past Medical History  Diagnosis Date  . Anemia   . Cancer     Rectal  . AAA (abdominal aortic aneurysm) 2011    ROS: [x]  Positive   [ ]  Denies    General: [ ]  Weight loss, [ ]  Fever, [ ]  chills Neurologic: [ ]  Dizziness, [ ]  Blackouts, [ ]  Seizure [ ]  Stroke, [ ]  "Mini stroke", [ ]  Slurred speech, [ ]  Temporary blindness; [ ]  weakness in arms or legs, [ ]  Hoarseness Cardiac: [ ]  Chest pain/pressure, [ ]  Shortness of breath at rest [ ]  Shortness of breath with exertion, [ ]  Atrial fibrillation or irregular heartbeat Vascular: [ ]  Pain in legs with walking, [ ]  Pain in legs at rest, [ ]  Pain in legs at night,  [ ]  Non-healing ulcer, [ ]  Blood clot in vein/DVT,   Pulmonary: [ ]  Home oxygen, [ ]  Productive cough, [ ]  Coughing up blood, [ ]  Asthma,  [ ]  Wheezing Musculoskeletal:  [ ]  Arthritis, [ ]  Low back pain, [ ]  Joint pain Hematologic: [ ]  Easy Bruising, [ ]  Anemia; [ ]  Hepatitis Gastrointestinal: [ ]  Blood in stool, [ ]  Gastroesophageal Reflux/heartburn, [ ]  Trouble swallowing Urinary: [ ]  chronic Kidney disease, [ ]  on HD - [ ]  MWF or [ ]  TTHS, [ ]  Burning with urination, [ ]  Difficulty urinating Skin: [ ]  Rashes, [ ]  Wounds Psychological: [ ]  Anxiety, [ ]  Depression   Social History History  Substance Use Topics  . Smoking status: Former Smoker -- 1.0 packs/day for 20 years    Types: Cigarettes    Quit date: 04/04/1988  . Smokeless tobacco: Not on file  . Alcohol Use: No    Family History Family History  Problem  Relation Age of Onset  . Hypertension Mother   . Hypertension Father   . Cancer Sister   . Diabetes Sister   . Hyperlipidemia Sister   . Hypertension Sister     Allergies  Allergen Reactions  . Shellfish Allergy Itching and Rash    Feels itching internally.  . Meperidine Hcl     REACTION: Hypotension  . Tramadol Hcl     REACTION: Tongue and lips swell    Current Outpatient Prescriptions  Medication Sig Dispense Refill  . amitriptyline (ELAVIL) 10 MG tablet Take 10 mg by mouth at bedtime.      Marland Kitchen amLODipine (NORVASC) 5 MG tablet Take 5 mg by mouth daily.      . Coenzyme Q10 (COQ-10) 200 MG CAPS Take 200 mg by mouth daily.      . ferrous sulfate 325 (65 FE) MG tablet Take 325 mg by mouth 3 (three) times daily with meals.      . furosemide (LASIX) 20 MG tablet Take 20 mg by mouth daily.      Marland Kitchen levothyroxine (SYNTHROID, LEVOTHROID) 75 MCG tablet Take 75 mcg by mouth daily.      Marland Kitchen oxybutynin (DITROPAN-XL) 10 MG 24 hr tablet Take 10 mg by mouth Daily.      . valsartan (  DIOVAN) 160 MG tablet Take 160 mg by mouth daily.        Physical Examination  Filed Vitals:   09/13/11 0945  BP: 164/94  Pulse: 69  Resp: 16    Body mass index is 32.65 kg/(m^2).  General:  WDWN in NAD Gait: Normal HEENT: WNL Eyes: Pupils equal Pulmonary: normal non-labored breathing , without Rales, rhonchi,  wheezing Cardiac: RRR, without  Murmurs, rubs or gallops; No carotid bruits Abdomen: soft, NT, no masses Skin: no rashes, ulcers noted Vascular Exam/Pulses: No abdominal mass is palpated, she has 2-3+ lower extremity pulses bilaterally  Extremities without ischemic changes, no Gangrene , no cellulitis; no open wounds;  Musculoskeletal: no muscle wasting or atrophy  Neurologic: A&O X 3; Appropriate Affect ; SENSATION: normal; MOTOR FUNCTION:  moving all extremities equally. Speech is fluent/normal  Non-Invasive Vascular Imaging: AAA duplex shows a maximum diameter of 4.7 distally by 4.8 which is  virtually unchanged from previous  ASSESSMENT/PLAN: I spoke with Dr. Arbie Cookey regarding the duplex, his recommendation is for her to return in one year with repeat duplex and see Dr. Hart Rochester to decide if she needs a repeat CT scan to check for endoleak. The patient's in agreement with this her questions were encouraged and answered.  Lauree Chandler ANP  Clinic M.D.: Early

## 2011-09-14 NOTE — Addendum Note (Signed)
Addended by: Sharee Pimple on: 09/14/2011 10:10 AM   Modules accepted: Orders

## 2011-09-20 NOTE — Procedures (Unsigned)
DUPLEX ULTRASOUND OF ABDOMINAL AORTA  INDICATION:  Abdominal aortic aneurysm stent  HISTORY: Diabetes:  No Cardiac:  No Hypertension:  Yes Smoking:  No Connective Tissue Disorder: Family History: Previous Surgery:  Endovascular repair of abdominal aortic aneurysm on 02/05/2010  DUPLEX EXAM:         AP (cm)                   TRANSVERSE (cm) Proximal             2.7 cm                    2.6 cm Mid                  2.2 cm                    2.3 cm Distal               4.7 cm                    4.8 cm Right Iliac          1.7 cm                    1.6 cm Left Iliac           1.5 cm                    1.6 cm  PREVIOUS:  Date:  09/07/2010 (CT)  AP:  4.8  TRANSVERSE:  5.0  IMPRESSION: 1. Patent endo stent of the abdominal aortic aneurysm with no     significant change in maximum diameter when compared to the     previous exam on 09/07/2010. 2. No evidence of endoleak or in-stent stenosis noted.  ___________________________________________ [name], M.D.  CH/MEDQ  D:  09/16/2011  T:  09/16/2011  Job:  [id number]

## 2011-12-23 ENCOUNTER — Other Ambulatory Visit: Payer: Self-pay | Admitting: Internal Medicine

## 2011-12-23 DIAGNOSIS — Z1231 Encounter for screening mammogram for malignant neoplasm of breast: Secondary | ICD-10-CM

## 2012-01-17 ENCOUNTER — Ambulatory Visit
Admission: RE | Admit: 2012-01-17 | Discharge: 2012-01-17 | Disposition: A | Discharge status: Home / Self Care | Payer: Medicare Other | Source: Ambulatory Visit | Attending: Internal Medicine | Admitting: Internal Medicine

## 2012-01-17 DIAGNOSIS — Z1231 Encounter for screening mammogram for malignant neoplasm of breast: Secondary | ICD-10-CM

## 2012-02-28 ENCOUNTER — Other Ambulatory Visit (HOSPITAL_BASED_OUTPATIENT_CLINIC_OR_DEPARTMENT_OTHER): Payer: Medicare Other | Admitting: Lab

## 2012-02-28 DIAGNOSIS — C20 Malignant neoplasm of rectum: Secondary | ICD-10-CM

## 2012-02-28 LAB — CBC WITH DIFFERENTIAL/PLATELET
BASO%: 0.5 % (ref 0.0–2.0)
EOS%: 3.6 % (ref 0.0–7.0)
LYMPH%: 16.3 % (ref 14.0–49.7)
MCH: 30.5 pg (ref 25.1–34.0)
MCHC: 33.6 g/dL (ref 31.5–36.0)
MCV: 90.7 fL (ref 79.5–101.0)
MONO%: 9.3 % (ref 0.0–14.0)
NEUT#: 4.3 10*3/uL (ref 1.5–6.5)
Platelets: 380 10*3/uL (ref 145–400)
RBC: 3.82 10*6/uL (ref 3.70–5.45)
RDW: 15.4 % — ABNORMAL HIGH (ref 11.2–14.5)

## 2012-05-26 ENCOUNTER — Encounter: Payer: Self-pay | Admitting: Oncology

## 2012-08-23 ENCOUNTER — Other Ambulatory Visit: Payer: Self-pay | Admitting: Oncology

## 2012-08-23 DIAGNOSIS — C20 Malignant neoplasm of rectum: Secondary | ICD-10-CM

## 2012-08-24 ENCOUNTER — Telehealth: Payer: Self-pay | Admitting: *Deleted

## 2012-08-24 ENCOUNTER — Encounter (HOSPITAL_COMMUNITY): Payer: Self-pay

## 2012-08-24 ENCOUNTER — Ambulatory Visit (HOSPITAL_COMMUNITY)
Admission: RE | Admit: 2012-08-24 | Discharge: 2012-08-24 | Disposition: A | Discharge status: Home / Self Care | Payer: Medicare Other | Source: Ambulatory Visit | Attending: Hematology and Oncology | Admitting: Hematology and Oncology

## 2012-08-24 ENCOUNTER — Other Ambulatory Visit (HOSPITAL_BASED_OUTPATIENT_CLINIC_OR_DEPARTMENT_OTHER): Payer: Medicare Other | Admitting: Lab

## 2012-08-24 ENCOUNTER — Other Ambulatory Visit: Payer: Self-pay | Admitting: Hematology and Oncology

## 2012-08-24 DIAGNOSIS — Z9221 Personal history of antineoplastic chemotherapy: Secondary | ICD-10-CM | POA: Insufficient documentation

## 2012-08-24 DIAGNOSIS — N289 Disorder of kidney and ureter, unspecified: Secondary | ICD-10-CM | POA: Insufficient documentation

## 2012-08-24 DIAGNOSIS — R918 Other nonspecific abnormal finding of lung field: Secondary | ICD-10-CM | POA: Insufficient documentation

## 2012-08-24 DIAGNOSIS — C20 Malignant neoplasm of rectum: Secondary | ICD-10-CM

## 2012-08-24 DIAGNOSIS — K5730 Diverticulosis of large intestine without perforation or abscess without bleeding: Secondary | ICD-10-CM | POA: Insufficient documentation

## 2012-08-24 DIAGNOSIS — Z90710 Acquired absence of both cervix and uterus: Secondary | ICD-10-CM | POA: Insufficient documentation

## 2012-08-24 DIAGNOSIS — N281 Cyst of kidney, acquired: Secondary | ICD-10-CM | POA: Insufficient documentation

## 2012-08-24 DIAGNOSIS — K769 Liver disease, unspecified: Secondary | ICD-10-CM | POA: Insufficient documentation

## 2012-08-24 LAB — CBC WITH DIFFERENTIAL/PLATELET
BASO%: 0.4 % (ref 0.0–2.0)
Eosinophils Absolute: 0.3 10*3/uL (ref 0.0–0.5)
LYMPH%: 12.2 % — ABNORMAL LOW (ref 14.0–49.7)
MCHC: 33.1 g/dL (ref 31.5–36.0)
MONO#: 0.6 10*3/uL (ref 0.1–0.9)
NEUT#: 5.6 10*3/uL (ref 1.5–6.5)
Platelets: 411 10*3/uL — ABNORMAL HIGH (ref 145–400)
RBC: 3.28 10*6/uL — ABNORMAL LOW (ref 3.70–5.45)
RDW: 16 % — ABNORMAL HIGH (ref 11.2–14.5)
WBC: 7.4 10*3/uL (ref 3.9–10.3)

## 2012-08-24 LAB — COMPREHENSIVE METABOLIC PANEL (CC13)
ALT: 24 U/L (ref 0–55)
Albumin: 3.4 g/dL — ABNORMAL LOW (ref 3.5–5.0)
Alkaline Phosphatase: 54 U/L (ref 40–150)
CO2: 23 mEq/L (ref 22–29)
Glucose: 101 mg/dl — ABNORMAL HIGH (ref 70–99)
Potassium: 4.5 mEq/L (ref 3.5–5.1)
Sodium: 137 mEq/L (ref 136–145)
Total Bilirubin: 0.26 mg/dL (ref 0.20–1.20)
Total Protein: 7.5 g/dL (ref 6.4–8.3)

## 2012-08-24 LAB — CEA: CEA: 77.8 ng/mL — ABNORMAL HIGH (ref 0.0–5.0)

## 2012-08-24 NOTE — Telephone Encounter (Signed)
Triage receiving call report of acute abnormal findings on CT done today that was ordered by Dr Elbert Ewings Odogwu with visit from 08/2011. Patient new to Dr Clelia Croft on upcoming appt on 08/29/12. Will notify MD of findings.  Complete report will be completed in EPIC for review.

## 2012-08-29 ENCOUNTER — Ambulatory Visit (HOSPITAL_BASED_OUTPATIENT_CLINIC_OR_DEPARTMENT_OTHER): Payer: Medicare Other | Admitting: Oncology

## 2012-08-29 ENCOUNTER — Telehealth: Payer: Self-pay | Admitting: Oncology

## 2012-08-29 ENCOUNTER — Ambulatory Visit: Payer: Medicare Other | Admitting: Hematology and Oncology

## 2012-08-29 VITALS — BP 156/71 | HR 80 | Resp 19 | Ht 64.0 in | Wt 191.1 lb

## 2012-08-29 DIAGNOSIS — K769 Liver disease, unspecified: Secondary | ICD-10-CM

## 2012-08-29 DIAGNOSIS — C20 Malignant neoplasm of rectum: Secondary | ICD-10-CM

## 2012-08-29 DIAGNOSIS — D649 Anemia, unspecified: Secondary | ICD-10-CM

## 2012-08-29 NOTE — Progress Notes (Signed)
Hematology and Oncology Follow Up Visit  Ashley Davenport [id number] 07/01/1930 79 y.o. 08/29/2012 10:39 AM [name], MDClark, Lindell Spar, MD   Principle Diagnosis: 79 year old woman with rectal cancer and anemia of chronic disease secondary to kidney disease. She was diagnosed with rectal cancer on  09/01/2006.  She hadT3 N0 moderately differentiated adenocarcinoma of the rectum with evidence of LV invasion.    Prior Therapy: She is status post neoadjuvant continuous infusion 5-FU with external radiation therapy between July 10, 2006 and Sep 01, 2006.  She declined abdominoperineal resection. She went on to receive additional 3 cycles of Xeloda, but developed hand-foot syndrome and declined further therapy.   Current therapy: Observation and follow up.  Interim History: Ashley Davenport presents today for a follow up visit. She is a very nice women with the following issues. She has had no  issues or concerns since her last visit. Her weight is stable. Besides pain secondary to neuralgia from shingles, she has no abnormal discomfort/pain. She has had no change in bowel function.She is still living independently and able to function at a high level without any decline in her energy or performance status. She is completely asymptomatic.     Medications: I have reviewed the patient's current medications.  Current Outpatient Prescriptions  Medication Sig Dispense Refill  . amLODipine (NORVASC) 5 MG tablet Take 5 mg by mouth daily.      . Coenzyme Q10 (COQ-10) 200 MG CAPS Take 200 mg by mouth daily.      . furosemide (LASIX) 20 MG tablet Take 20 mg by mouth daily.      Marland Kitchen levothyroxine (SYNTHROID, LEVOTHROID) 75 MCG tablet Take 75 mcg by mouth daily.      . valsartan (DIOVAN) 160 MG tablet Take 160 mg by mouth daily.       No current facility-administered medications for this visit.     Allergies:  Allergies  Allergen Reactions  . Shellfish Allergy Itching and Rash    Feels  itching internally.  . Meperidine Hcl     REACTION: Hypotension  . Tramadol Hcl     REACTION: Tongue and lips swell    Past Medical History, Surgical history, Social history, and Family History were reviewed and updated.  Review of Systems: Constitutional:  Negative for fever, chills, night sweats, anorexia, weight loss, pain. Cardiovascular: no chest pain or dyspnea on exertion Respiratory: negative Neurological: negative Dermatological: negative ENT: negative Skin: Negative. Gastrointestinal: negative Genito-Urinary: negative Hematological and Lymphatic: negative Breast: negative Musculoskeletal: negative Remaining ROS negative. Physical Exam: Blood pressure 156/71, pulse 80, resp. rate 19, height 5\' 4"  (1.626 m), weight 191 lb 1.6 oz (86.682 kg). ECOG: 0 General appearance: alert Head: Normocephalic, without obvious abnormality, atraumatic Neck: no adenopathy, no carotid bruit, no JVD, supple, symmetrical, trachea midline and thyroid not enlarged, symmetric, no tenderness/mass/nodules Lymph nodes: Cervical, supraclavicular, and axillary nodes normal. Heart:regular rate and rhythm, S1, S2 normal, no murmur, click, rub or gallop Lung:chest clear, no wheezing, rales, normal symmetric air entry Abdomin: soft, non-tender, without masses or organomegaly EXT:no erythema, induration, or nodules   Lab Results: Lab Results  Component Value Date   WBC 7.4 08/24/2012   HGB 9.8* 08/24/2012   HCT 29.6* 08/24/2012   MCV 90.5 08/24/2012   PLT 411* 08/24/2012     Chemistry      Component Value Date/Time   NA 137 08/24/2012 0925   NA 137 08/30/2011 1107   K 4.5 08/24/2012 0925   K 4.7  08/30/2011 1107   CL 102 08/24/2012 0925   CL 103 08/30/2011 1107   CO2 23 08/24/2012 0925   CO2 26 08/30/2011 1107   BUN 28.7* 08/24/2012 0925   BUN 43* 08/30/2011 1107   CREATININE 2.1* 08/24/2012 0925   CREATININE 2.10* 08/30/2011 1107      Component Value Date/Time   CALCIUM 9.5 08/24/2012 0925    CALCIUM 9.0 08/30/2011 1107   ALKPHOS 54 08/24/2012 0925   ALKPHOS 52 08/30/2011 1107   AST 61* 08/24/2012 0925   AST 21 08/30/2011 1107   ALT 24 08/24/2012 0925   ALT 18 08/30/2011 1107   BILITOT 0.26 08/24/2012 0925   BILITOT 0.3 08/30/2011 1107       Radiological Studies: CT CHEST, ABDOMEN AND PELVIS WITHOUT CONTRAST  Technique: Multidetector CT imaging of the chest, abdomen and  pelvis was performed following the standard protocol without IV  contrast.  Comparison: CT 02/02/2011  CT CHEST  Findings: No axillary or supraclavicular lymphadenopathy. No  mediastinal or hilar lymphadenopathy. No pericardial fluid.  Esophagus is normal.  There are no suspicious pulmonary nodules. There is several small  pulmonary nodules which are unchanged from prior. For example 4  mm right upper lobe nodule (image 15) and 2 mm right lower lobe  nodule (image 25). Smudgy 4 mm nodule in the left upper lobe  (image 15).  IMPRESSION:  1. No evidence of thoracic metastasis.  2. Bilateral small sub 5 mm pulmonary nodules are stable compared  to prior. Recommend attention on follow-up.  CT ABDOMEN AND PELVIS  Findings: There is a new 8.7 x 6.0 cm lesion in the medial left  hepatic lobe. Multiple additional small hypodense lesions likely  representing benign cysts are unchanged.  The stomach, gallbladder, pancreas, spleen, adrenal glands, kidneys  are unchanged. There is a simple cysts extend from the left and  right kidney which are stable.  The small bowel is normal. Cecum is normal. There are diverticula  throughout the colon. The rectosigmoid colon appears normal.  There is no evidence of colonic obstruction.  Abdominal aorta is stable with a Endo graft. No retroperitoneal  lymphadenopathy. No periportal lymphadenopathy. No mesenteric  disease.  No free fluid the pelvis. The bladder is normal. Post  hysterectomy anatomy. Review of bone windows demonstrates no  aggressive osseous lesions.   IMPRESSION:  1. New large 8 cm mass within the central left hepatic lobe (  segment IVb). This is concerning for solitary metastasis. This  lesion would be amendable to ultrasound-guided percutaneous biopsy.  2. No evidence of additional metastatic disease or local  recurrence. Consider restaging FDG PET scan.  These results will be called to the ordering clinician or  representative by the Radiologist Assistant, and communication  documented in the PACS Dashboard.    Impression and Plan:  Ashley Davenport is a 79 year old woman with:  1. T3 N0 moderately differentiated adenocarcinoma of the rectum with evidence of LV invasion and positive surgical margins 05/30/2006.  She is status post neoadjuvant continuous infusion 5-FU with external radiation therapy between July 10, 2006 and Sep 01, 2006.  She declined abdominoperineal resection. Her CT scan was reviewed today and shows a clear liver mass that is likely hepatic mets from her rectal cancer. Her CEA is elevated to 77 as well.  To work this up completely: I will arrange for MRI, biopsy and evaluation from Dr. Donell Beers for possible hepatic resection.  If this tumor is not resectable, she will need palliative chemotherapy instead  and likely a port as well. I explained to her that these liver lesions might not be amendable to resection which is really her best chance for a long disease interval.  I think she still has an excellent performance status and aggressive options should be considered at this time. She want to be as aggressive as possible.  I will set her up with a quick follow up after she complete this work up to discuss the next step.   2. Anemia: stable at this point, likely to kidney disease.      [facility], MD 5/28/201410:39 AM

## 2012-08-29 NOTE — Telephone Encounter (Signed)
Gave pt appt for for June to see Dr.Shadad, on June 17th and pt will see Dr. Donell Beers on 6/10 pt aware of appt

## 2012-08-29 NOTE — Addendum Note (Signed)
Addended by: Benjiman Core on: 08/29/2012 02:07 PM   Modules accepted: Orders

## 2012-08-30 ENCOUNTER — Encounter (HOSPITAL_COMMUNITY): Payer: Self-pay | Admitting: Pharmacy Technician

## 2012-09-03 ENCOUNTER — Other Ambulatory Visit: Payer: Self-pay | Admitting: Radiology

## 2012-09-04 ENCOUNTER — Ambulatory Visit (HOSPITAL_COMMUNITY)
Admission: RE | Admit: 2012-09-04 | Discharge: 2012-09-04 | Disposition: A | Discharge status: Home / Self Care | Payer: Medicare Other | Source: Ambulatory Visit | Attending: Oncology | Admitting: Oncology

## 2012-09-04 DIAGNOSIS — C20 Malignant neoplasm of rectum: Secondary | ICD-10-CM

## 2012-09-07 ENCOUNTER — Encounter (HOSPITAL_COMMUNITY): Payer: Self-pay

## 2012-09-07 ENCOUNTER — Ambulatory Visit (HOSPITAL_COMMUNITY)
Admission: RE | Admit: 2012-09-07 | Discharge: 2012-09-07 | Disposition: A | Discharge status: Home / Self Care | Payer: Medicare Other | Source: Ambulatory Visit | Attending: Oncology | Admitting: Oncology

## 2012-09-07 DIAGNOSIS — C20 Malignant neoplasm of rectum: Secondary | ICD-10-CM

## 2012-09-07 DIAGNOSIS — K7689 Other specified diseases of liver: Secondary | ICD-10-CM | POA: Insufficient documentation

## 2012-09-07 DIAGNOSIS — C19 Malignant neoplasm of rectosigmoid junction: Secondary | ICD-10-CM | POA: Insufficient documentation

## 2012-09-07 DIAGNOSIS — C801 Malignant (primary) neoplasm, unspecified: Secondary | ICD-10-CM | POA: Insufficient documentation

## 2012-09-07 LAB — CBC
HCT: 28.6 % — ABNORMAL LOW (ref 36.0–46.0)
Hemoglobin: 9.4 g/dL — ABNORMAL LOW (ref 12.0–15.0)
MCH: 29.5 pg (ref 26.0–34.0)
MCHC: 32.9 g/dL (ref 30.0–36.0)
RDW: 15.4 % (ref 11.5–15.5)

## 2012-09-07 MED ORDER — ACETAMINOPHEN 500 MG PO TABS
500.0000 mg | ORAL_TABLET | Freq: Four times a day (QID) | ORAL | Status: DC | PRN
  Filled 2012-09-07: qty 1

## 2012-09-07 MED ORDER — MIDAZOLAM HCL 2 MG/2ML IJ SOLN
INTRAMUSCULAR | Status: AC
  Filled 2012-09-07: qty 4

## 2012-09-07 MED ORDER — SODIUM CHLORIDE 0.9 % IV SOLN
INTRAVENOUS | Status: DC
  Administered 2012-09-07: 11:00:00 via INTRAVENOUS

## 2012-09-07 MED ORDER — MIDAZOLAM HCL 2 MG/2ML IJ SOLN
INTRAMUSCULAR | Status: AC | PRN
  Administered 2012-09-07: 1 mg via INTRAVENOUS

## 2012-09-07 MED ORDER — FENTANYL CITRATE 0.05 MG/ML IJ SOLN
INTRAMUSCULAR | Status: AC | PRN
  Administered 2012-09-07: 100 ug via INTRAVENOUS

## 2012-09-07 MED ORDER — FENTANYL CITRATE 0.05 MG/ML IJ SOLN
INTRAMUSCULAR | Status: AC
  Filled 2012-09-07: qty 4

## 2012-09-07 NOTE — Procedures (Signed)
US guided core biopsies of liver mass.  3 cores obtained.  No immediate complication.

## 2012-09-07 NOTE — H&P (Signed)
Ashley Davenport is an 79 y.o. female.   Chief Complaint: "I'm having a liver biopsy" HPI: Patient with history of rectal carcinoma and liver lesion noted on recent imaging presents today for US guided liver lesion biopsy.  Past Medical History  Diagnosis Date  . Anemia   . Cancer     Rectal  . AAA (abdominal aortic aneurysm) 2011    History reviewed. No pertinent past surgical history.  Family History  Problem Relation Age of Onset  . Hypertension Mother   . Hypertension Father   . Cancer Sister   . Diabetes Sister   . Hyperlipidemia Sister   . Hypertension Sister    Social History:  reports that she quit smoking about 24 years ago. Her smoking use included Cigarettes. She has a 20 pack-year smoking history. She does not have any smokeless tobacco history on file. She reports that she does not drink alcohol or use illicit drugs.  Allergies:  Allergies  Allergen Reactions  . Shellfish Allergy Itching and Rash    Feels itching internally.  . Meperidine Hcl     REACTION: Hypotension  . Rosuvastatin Other (See Comments)    "Makes my muscles ache and weak in the legs"  . Tramadol Hcl     REACTION: Tongue and lips swell    Current outpatient prescriptions:acetaminophen (TYLENOL) 500 MG tablet, Take 1,000 mg by mouth every 6 (six) hours as needed for pain., Disp: , Rfl: ;  allopurinol (ZYLOPRIM) 150 mg TABS, Take 150 mg by mouth every morning. , Disp: , Rfl: ;  amLODipine (NORVASC) 5 MG tablet, Take 5 mg by mouth every morning. , Disp: , Rfl: ;  baclofen (LIORESAL) 10 MG tablet, Take 10 mg by mouth 2 (two) times daily., Disp: , Rfl:  Biotin 5000 MCG CAPS, Take 5,000 mcg by mouth every morning. , Disp: , Rfl: ;  Coenzyme Q10 (COQ-10) 200 MG CAPS, Take 200 mg by mouth at bedtime. , Disp: , Rfl: ;  furosemide (LASIX) 20 MG tablet, Take 20 mg by mouth every morning. , Disp: , Rfl: ;  levothyroxine (SYNTHROID, LEVOTHROID) 75 MCG tablet, Take 75 mcg by mouth every morning. , Disp: ,  Rfl: ;  pravastatin (PRAVACHOL) 20 MG tablet, Take 20 mg by mouth at bedtime. , Disp: , Rfl:  Prenat w/o A-FeCbGl-DSS-FA-DHA (CITRANATAL DHA PO), Take 1 tablet by mouth every morning., Disp: , Rfl: ;  valsartan (DIOVAN) 160 MG tablet, Take 160 mg by mouth every morning. , Disp: , Rfl:  Current facility-administered medications:0.9 %  sodium chloride infusion, , Intravenous, Continuous, D Kevin Allred, PA-C, Last Rate: 20 mL/hr at 09/07/12 1044 Facility-Administered Medications Ordered in Other Encounters: fentaNYL (SUBLIMAZE) 0.05 MG/ML injection, , , , ;  midazolam (VERSED) 2 MG/2ML injection, , , ,    Results for orders placed during the hospital encounter of 09/07/12 (from the past 48 hour(s))  APTT     Status: None   Collection Time    09/07/12 10:35 AM      Result Value Range   aPTT 33  24 - 37 seconds  CBC     Status: Abnormal   Collection Time    09/07/12 10:35 AM      Result Value Range   WBC 7.9  4.0 - 10.5 K/uL   RBC 3.19 (*) 3.87 - 5.11 MIL/uL   Hemoglobin 9.4 (*) 12.0 - 15.0 g/dL   HCT 16.1 (*) 09.6 - 04.5 %   MCV 89.7  78.0 - 100.0  fL   MCH 29.5  26.0 - 34.0 pg   MCHC 32.9  30.0 - 36.0 g/dL   RDW 40.9  81.1 - 91.4 %   Platelets 585 (*) 150 - 400 K/uL  PROTIME-INR     Status: None   Collection Time    09/07/12 10:35 AM      Result Value Range   Prothrombin Time 12.9  11.6 - 15.2 seconds   INR 0.98  0.00 - 1.49   No results found.  Review of Systems  Constitutional: Negative for fever and chills.  Respiratory: Negative for cough and shortness of breath.   Cardiovascular: Negative for chest pain.  Gastrointestinal: Negative for nausea and vomiting.       Occ LUQ neuropathic pain from prior shingles  Musculoskeletal: Negative for back pain.  Neurological: Negative for headaches.  Endo/Heme/Allergies: Does not bruise/bleed easily.    Blood pressure 152/79, pulse 69, temperature 97.6 F (36.4 C), temperature source Oral, resp. rate 18, height 5\' 4"  (1.626 m),  weight 191 lb (86.637 kg), SpO2 100.00%. Physical Exam  Constitutional: She is oriented to person, place, and time. She appears well-developed and well-nourished.  Cardiovascular: Normal rate and regular rhythm.   Respiratory: Effort normal and breath sounds normal.  GI: Soft. Bowel sounds are normal. There is no tenderness.  Musculoskeletal: Normal range of motion. She exhibits no edema.  Neurological: She is alert and oriented to person, place, and time.     Assessment/Plan Pt with hx rectal carcinoma and left hepatic lobe lesion. Plan is for US guided liver lesion biopsy today. Details/risks of procedure d/w pt with her understanding and consent.  ALLRED,D KEVIN 09/07/2012, 12:56 PM

## 2012-09-11 ENCOUNTER — Encounter (INDEPENDENT_AMBULATORY_CARE_PROVIDER_SITE_OTHER): Payer: Self-pay | Admitting: General Surgery

## 2012-09-11 ENCOUNTER — Ambulatory Visit (INDEPENDENT_AMBULATORY_CARE_PROVIDER_SITE_OTHER): Payer: Medicare Other | Admitting: General Surgery

## 2012-09-11 ENCOUNTER — Ambulatory Visit: Payer: Medicare Other | Admitting: Vascular Surgery

## 2012-09-11 ENCOUNTER — Telehealth: Payer: Self-pay | Admitting: Dietician

## 2012-09-11 VITALS — BP 132/66 | HR 89 | Temp 96.5°F | Ht 64.0 in | Wt 187.8 lb

## 2012-09-11 DIAGNOSIS — C20 Malignant neoplasm of rectum: Secondary | ICD-10-CM | POA: Insufficient documentation

## 2012-09-11 DIAGNOSIS — C787 Secondary malignant neoplasm of liver and intrahepatic bile duct: Secondary | ICD-10-CM | POA: Insufficient documentation

## 2012-09-11 NOTE — Patient Instructions (Signed)
Get PET scan.  Plan short course chemotherapy and reassess with scan.  Will schedule port a cath the week of June 23.

## 2012-09-11 NOTE — Assessment & Plan Note (Signed)
Will plan chemotherapy up front.  I am very concerned about her borderline kidney function and age.    I am unable to get contrasted CT. Pt refuses MR due to her aortic stent graft.  I am concerned about the remainder of the liver having occult metastases. I discussed the patient with Dr. Clelia Croft.   Plan will be PET, Port a cath, chemotherapy, then reassess CEA and imaging.  Follow up after/toward end of short course chemotherapy.    Counseling, coordination of care/exam 30 min total visit, with counseling >50%

## 2012-09-11 NOTE — Progress Notes (Signed)
Chief Complaint  Patient presents with  . New Evaluation    eval liver mass    HISTORY: Pt is an 79 yo female who presents with new liver metastasis.  She had rectal cancer diagnosed in 2008.   She underwent transrectal excision by Dr. Ballen and was found to have T3N0 tumor.  APR recommended, but she declined.  She underwent chemotherapy and chemoradiation and has not had disease until now.  She did have brief bump in CEA in 2012 to 7, but this came back down.  CEA this year was 77, and scan demonstrated 8 cm central left hepatic mass.  Biopsy consistent with prior rectal cancer.  She is asymptomatic except for some soreness at biopsy site.  She denies weight loss, fatigue, changes in bowel habits, pain, nausea, or vomiting.  She goes to the gym 3 times/week.  She was a patient of Dr. Odogwu and transitioned to Dr. Shadad this year.  She does have a family history of breast cancer in cousin and twin sister with ovarian cancer  Past Medical History  Diagnosis Date  . Anemia   . AAA (abdominal aortic aneurysm) 2011  . Hyperlipidemia   . Hypertension   . Thyroid disease   . Shingles   . Cancer     Rectal    Past Surgical History  Procedure Laterality Date  . Abdominal hysterectomy  1961    total  . Tonsillectomy  1949  . Bunionectomy Bilateral   . Cyst on kidney removal  2008    Current Outpatient Prescriptions  Medication Sig Dispense Refill  . acetaminophen (TYLENOL) 500 MG tablet Take 1,000 mg by mouth every 6 (six) hours as needed for pain.      . allopurinol (ZYLOPRIM) 150 mg TABS Take 150 mg by mouth every morning.       . amLODipine (NORVASC) 5 MG tablet Take 5 mg by mouth every morning.       . baclofen (LIORESAL) 10 MG tablet Take 10 mg by mouth 2 (two) times daily.      . Biotin 5000 MCG CAPS Take 5,000 mcg by mouth every morning.       . Coenzyme Q10 (COQ-10) 200 MG CAPS Take 200 mg by mouth at bedtime.       . furosemide (LASIX) 20 MG tablet Take 20 mg by mouth every  morning.       . levothyroxine (SYNTHROID, LEVOTHROID) 75 MCG tablet Take 75 mcg by mouth every morning.       . pravastatin (PRAVACHOL) 20 MG tablet Take 20 mg by mouth at bedtime.       . Prenat w/o A-FeCbGl-DSS-FA-DHA (CITRANATAL DHA PO) Take 1 tablet by mouth every morning.      . valsartan (DIOVAN) 160 MG tablet Take 160 mg by mouth every morning.        No current facility-administered medications for this visit.     Allergies  Allergen Reactions  . Shellfish Allergy Itching and Rash    Feels itching internally.  . Demerol (Meperidine)   . Meperidine Hcl     REACTION: Hypotension  . Rosuvastatin Other (See Comments)    "Makes my muscles ache and weak in the legs"  . Tramadol Hcl     REACTION: Tongue and lips swell     Family History  Problem Relation Age of Onset  . Hypertension Mother   . Heart disease Mother   . Hypertension Father   . Heart disease Father   .   Cancer Sister     ovarian  . Diabetes Sister   . Hyperlipidemia Sister   . Hypertension Sister   . Cancer Cousin     breast  . Cancer Cousin     breast     History   Social History  . Marital Status: Single    Spouse Name: N/A    Number of Children: N/A  . Years of Education: N/A   Social History Main Topics  . Smoking status: Former Smoker -- 1.00 packs/day for 20 years    Types: Cigarettes    Quit date: 04/04/1988  . Smokeless tobacco: None  . Alcohol Use: Yes     Comment: occ  . Drug Use: No  . Sexually Active: None   REVIEW OF SYSTEMS - PERTINENT POSITIVES ONLY: 12 point review of systems negative other than HPI and PMH  EXAM: Filed Vitals:   09/11/12 1024  BP: 132/66  Pulse: 89  Temp: 96.5 F (35.8 C)   Filed Weights   09/11/12 1024  Weight: 187 lb 12.8 oz (85.186 kg)     Gen:  No acute distress.  Well nourished and well groomed.   Neurological: Alert and oriented to person, place, and time. Coordination normal.  Head: Normocephalic and atraumatic.  Eyes: Conjunctivae  are normal. Pupils are equal, round, and reactive to light. No scleral icterus.  Neck: Normal range of motion. Neck supple. No tracheal deviation or thyromegaly present.  Cardiovascular: Normal rate, regular rhythm, normal heart sounds and intact distal pulses.  Exam reveals no gallop and no friction rub.  No murmur heard. Respiratory: Effort normal.  No respiratory distress. No chest wall tenderness. Breath sounds normal.  No wheezes, rales or rhonchi.  GI: Soft. Bowel sounds are normal. The abdomen is soft and nondistended. There is mild RUQ focal tenderness and fullness.  There is no rebound and no guarding. Lower midline incision. Musculoskeletal: Normal range of motion. Extremities are nontender.  Lymphadenopathy: No cervical, preauricular, postauricular or axillary adenopathy is present Skin: Skin is warm and dry. No rash noted. No diaphoresis. No erythema. No pallor. No clubbing, cyanosis, or edema.   Psychiatric: Normal mood and affect. Behavior is normal. Judgment and thought content normal.    LABORATORY RESULTS: Available labs are reviewed  Pertinent labs Cr 2.1, HCT 28.6, CEA 77.8, Alb 3.4, AST 61  RADIOLOGY RESULTS: See E-Chart or I-Site for most recent results.  Images and reports are reviewed. CT chest IMPRESSION:  1. No evidence of thoracic metastasis.  2. Bilateral small sub 5 mm pulmonary nodules are stable compared  to prior. Recommend attention on follow-up.   Ct abd IMPRESSION:  1. New large 8 cm mass within the central left hepatic lobe (  segment IVb). This is concerning for solitary metastasis. This  lesion would be amendable to ultrasound-guided percutaneous biopsy.  2. No evidence of additional metastatic disease or local  recurrence. Consider restaging FDG PET scan.  These results will be called to the ordering clinician or  representative by the Radiologist Assistant, and communication  documented in the PACS Dashboard    ASSESSMENT AND PLAN: Rectal  adenocarcinoma metastatic to liver Will plan chemotherapy up front.  I am very concerned about her borderline kidney function and age.    I am unable to get contrasted CT. Pt refuses MR due to her aortic stent graft.  I am concerned about the remainder of the liver having occult metastases. I discussed the patient with Dr. Shadad.   Plan will   be PET, Port a cath, chemotherapy, then reassess CEA and imaging.  Follow up after/toward end of short course chemotherapy.    Counseling, coordination of care/exam 30 min total visit, with counseling >50%     Boyd Litaker L Mylz Yuan MD Surgical Oncology, General and Endocrine Surgery [facility]      Visit Diagnoses: 1. Rectal adenocarcinoma metastatic to liver     Primary Care Physician: CLARK,PRESTON S, MD    

## 2012-09-14 ENCOUNTER — Encounter (HOSPITAL_COMMUNITY): Payer: Self-pay | Admitting: Pharmacy Technician

## 2012-09-18 ENCOUNTER — Encounter (HOSPITAL_COMMUNITY)
Admission: RE | Admit: 2012-09-18 | Discharge: 2012-09-18 | Disposition: A | Discharge status: Home / Self Care | Payer: Medicare Other | Source: Ambulatory Visit | Attending: General Surgery | Admitting: General Surgery

## 2012-09-18 ENCOUNTER — Ambulatory Visit (HOSPITAL_COMMUNITY)
Admission: RE | Admit: 2012-09-18 | Discharge: 2012-09-18 | Disposition: A | Discharge status: Home / Self Care | Payer: Medicare Other | Source: Ambulatory Visit | Attending: General Surgery | Admitting: General Surgery

## 2012-09-18 ENCOUNTER — Encounter (HOSPITAL_COMMUNITY): Payer: Self-pay

## 2012-09-18 ENCOUNTER — Telehealth: Payer: Self-pay | Admitting: Oncology

## 2012-09-18 ENCOUNTER — Other Ambulatory Visit (HOSPITAL_COMMUNITY): Payer: Self-pay | Admitting: *Deleted

## 2012-09-18 ENCOUNTER — Ambulatory Visit (HOSPITAL_BASED_OUTPATIENT_CLINIC_OR_DEPARTMENT_OTHER): Payer: Medicare Other | Admitting: Oncology

## 2012-09-18 VITALS — BP 136/74 | HR 76 | Temp 97.7°F | Resp 18 | Ht 64.0 in | Wt 190.7 lb

## 2012-09-18 DIAGNOSIS — C787 Secondary malignant neoplasm of liver and intrahepatic bile duct: Secondary | ICD-10-CM | POA: Insufficient documentation

## 2012-09-18 DIAGNOSIS — R030 Elevated blood-pressure reading, without diagnosis of hypertension: Secondary | ICD-10-CM | POA: Insufficient documentation

## 2012-09-18 DIAGNOSIS — C20 Malignant neoplasm of rectum: Secondary | ICD-10-CM | POA: Insufficient documentation

## 2012-09-18 DIAGNOSIS — Z01818 Encounter for other preprocedural examination: Secondary | ICD-10-CM | POA: Insufficient documentation

## 2012-09-18 HISTORY — DX: Personal history of other diseases of the musculoskeletal system and connective tissue: Z87.39

## 2012-09-18 HISTORY — DX: Cyst of kidney, acquired: N28.1

## 2012-09-18 HISTORY — DX: Malignant neoplasm of rectum: C20

## 2012-09-18 HISTORY — DX: Hypothyroidism, unspecified: E03.9

## 2012-09-18 LAB — CBC
HCT: 28.8 % — ABNORMAL LOW (ref 36.0–46.0)
Hemoglobin: 9.6 g/dL — ABNORMAL LOW (ref 12.0–15.0)
MCH: 30.2 pg (ref 26.0–34.0)
MCHC: 33.3 g/dL (ref 30.0–36.0)
RDW: 15.5 % (ref 11.5–15.5)

## 2012-09-18 LAB — BASIC METABOLIC PANEL
BUN: 38 mg/dL — ABNORMAL HIGH (ref 6–23)
Chloride: 103 mEq/L (ref 96–112)
Creatinine, Ser: 2.23 mg/dL — ABNORMAL HIGH (ref 0.50–1.10)
GFR calc Af Amer: 23 mL/min — ABNORMAL LOW (ref >90)
Glucose, Bld: 93 mg/dL (ref 70–99)
Potassium: 5.5 mEq/L — ABNORMAL HIGH (ref 3.5–5.1)

## 2012-09-18 LAB — SURGICAL PCR SCREEN: Staphylococcus aureus: NEGATIVE

## 2012-09-18 MED ORDER — PROCHLORPERAZINE MALEATE 10 MG PO TABS: 10.0000 mg | ORAL_TABLET | Freq: Four times a day (QID) | ORAL | Status: DC | PRN

## 2012-09-18 MED ORDER — LIDOCAINE-PRILOCAINE 2.5-2.5 % EX CREA: TOPICAL_CREAM | CUTANEOUS | Status: DC | PRN

## 2012-09-18 NOTE — Progress Notes (Signed)
Abnormal CBC / BMET routed to Dr. Donell Beers

## 2012-09-18 NOTE — Progress Notes (Signed)
Hematology and Oncology Follow Up Visit  Ashley Davenport [id number] Jan 27, 1931 79 y.o. 09/18/2012 4:29 PM Ashley Davenport, MDClark, Lindell Spar, MD   Principle Diagnosis: 79 year old woman with rectal cancer and anemia of chronic disease secondary to kidney disease. She was diagnosed with rectal cancer on  09/01/2006.  She hadT3 N0 moderately differentiated adenocarcinoma of the rectum with evidence of LV invasion. Now she has stage IV disease with liver mets.    Prior Therapy: She is status post neoadjuvant continuous infusion 5-FU with external radiation therapy between July 10, 2006 and Sep 01, 2006.  She declined abdominoperineal resection. She went on to receive additional 3 cycles of Xeloda, but developed hand-foot syndrome and declined further therapy.   Current therapy: She is about to start chemotherapy in the near future.  Interim History: Ashley Davenport presents today for a follow up visit. She is a very nice women with the above diagnosis. Since her last visit, she had liver biopsy which confirmed the presence of metastatic colon cancer to the liver. She was evaluated by Dr. Donell Beers for possible hepatic resection and felt that systemic chemotherapy would be a week ago at the time being. She has had no issues or concerns since her last visit. Her weight is stable. Besides pain secondary to neuralgia from shingles, she has no abnormal discomfort/pain. She has had no change in bowel function.She is still living independently and able to function at a high level without any decline in her energy or performance status. She is completely asymptomatic.     Medications: I have reviewed the patient's current medications.  Current Outpatient Prescriptions  Medication Sig Dispense Refill  . acetaminophen (TYLENOL) 500 MG tablet Take 1,000 mg by mouth every 6 (six) hours as needed for pain.      Marland Kitchen allopurinol (ZYLOPRIM) 150 mg TABS Take 150 mg by mouth every morning.       Marland Kitchen amLODipine  (NORVASC) 10 MG tablet Take 10 mg by mouth every morning.      . baclofen (LIORESAL) 10 MG tablet Take 10 mg by mouth 2 (two) times daily.      . Biotin 5000 MCG CAPS Take 5,000 mcg by mouth every morning.       . Coenzyme Q10 (COQ-10) 200 MG CAPS Take 200 mg by mouth at bedtime.       . furosemide (LASIX) 20 MG tablet Take 20 mg by mouth every morning.       Marland Kitchen levothyroxine (SYNTHROID, LEVOTHROID) 75 MCG tablet Take 75 mcg by mouth every morning.       . pravastatin (PRAVACHOL) 20 MG tablet Take 20 mg by mouth at bedtime.       . Prenat w/o A-FeCbGl-DSS-FA-DHA (CITRANATAL DHA PO) Take 1 tablet by mouth every morning.      . valsartan (DIOVAN) 160 MG tablet Take 160 mg by mouth every morning.       . lidocaine-prilocaine (EMLA) cream Apply topically as needed. Apply to port before chemotherapy  30 g  0  . prochlorperazine (COMPAZINE) 10 MG tablet Take 1 tablet (10 mg total) by mouth every 6 (six) hours as needed.  30 tablet  0   No current facility-administered medications for this visit.     Allergies:  Allergies  Allergen Reactions  . Shellfish Allergy Itching and Rash    Feels itching internally.  . Meperidine Hcl     REACTION: Hypotension  . Rosuvastatin Other (See Comments)    "Makes my muscles ache  and weak in the legs"  . Tramadol Hcl     REACTION: Tongue and lips swell    Past Medical History, Surgical history, Social history, and Family History were reviewed and updated.  Review of Systems: Constitutional:  Negative for fever, chills, night sweats, anorexia, weight loss, pain. Cardiovascular: no chest pain or dyspnea on exertion Respiratory: negative Neurological: negative Dermatological: negative ENT: negative Skin: Negative. Gastrointestinal: negative Genito-Urinary: negative Hematological and Lymphatic: negative Breast: negative Musculoskeletal: negative Remaining ROS negative. Physical Exam: Blood pressure 136/74, pulse 76, temperature 97.7 F (36.5 C),  temperature source Oral, resp. rate 18, height 5\' 4"  (1.626 m), weight 190 lb 11.2 oz (86.501 kg). ECOG: 0 General appearance: alert Head: Normocephalic, without obvious abnormality, atraumatic Neck: no adenopathy, no carotid bruit, no JVD, supple, symmetrical, trachea midline and thyroid not enlarged, symmetric, no tenderness/mass/nodules Lymph nodes: Cervical, supraclavicular, and axillary nodes normal. Heart:regular rate and rhythm, S1, S2 normal, no murmur, click, rub or gallop Lung:chest clear, no wheezing, rales, normal symmetric air entry Abdomin: soft, non-tender, without masses or organomegaly EXT:no erythema, induration, or nodules   Lab Results: Lab Results  Component Value Date   WBC 7.0 09/18/2012   HGB 9.6* 09/18/2012   HCT 28.8* 09/18/2012   MCV 90.6 09/18/2012   PLT 539* 09/18/2012     Chemistry      Component Value Date/Time   NA 138 09/18/2012 1355   NA 137 08/24/2012 0925   K 5.5* 09/18/2012 1355   K 4.5 08/24/2012 0925   CL 103 09/18/2012 1355   CL 102 08/24/2012 0925   CO2 24 09/18/2012 1355   CO2 23 08/24/2012 0925   BUN 38* 09/18/2012 1355   BUN 28.7* 08/24/2012 0925   CREATININE 2.23* 09/18/2012 1355   CREATININE 2.1* 08/24/2012 0925      Component Value Date/Time   CALCIUM 9.7 09/18/2012 1355   CALCIUM 9.5 08/24/2012 0925   ALKPHOS 54 08/24/2012 0925   ALKPHOS 52 08/30/2011 1107   AST 61* 08/24/2012 0925   AST 21 08/30/2011 1107   ALT 24 08/24/2012 0925   ALT 18 08/30/2011 1107   BILITOT 0.26 08/24/2012 0925   BILITOT 0.3 08/30/2011 1107       Impression and Plan:  Ashley Davenport is a 78 year old woman with:  1. T3 N0 moderately differentiated adenocarcinoma of the rectum with evidence of LV invasion and positive surgical margins 05/30/2006.  She is status post neoadjuvant continuous infusion 5-FU with external radiation therapy between July 10, 2006 and Sep 01, 2006.  Her recent CT scan and a biopsy confirmed the presence of stage IV colorectal cancer. Patient  had already been evaluated for possible hepatic resection and felt that systemic chemotherapy would be the way to go up front and then possible evaluation for a liver directed therapy can follow.  The logistics of administration of chemotherapy was discussed today including infusional 5-FU with CPT-11 as well as the Avastin. Complications from this chemotherapy were outlined in details including nausea, vomiting, mucositis, GI toxicity, wound healing difficulties, hypertension, thromboembolic disease as well as infusion-related toxicities. Myelosuppression hair loss as well as proteinuria are all complications were discussed today in detail. Patient is willing to proceed I've given her prescription for Compazine for nausea prophylaxis.  In terms of IV access she'll have a Port-A-Cath placed on 09/24/2012.  2. Anemia: stable at this point, likely to kidney disease.      [facility], MD 6/17/20144:29 PM

## 2012-09-18 NOTE — Patient Instructions (Addendum)
Yolunda C Barros  09/18/2012                           YOUR PROCEDURE IS SCHEDULED ON:  09/24/12               PLEASE REPORT TO SHORT STAY CENTER AT :  5:30 am               CALL THIS NUMBER IF ANY PROBLEMS THE DAY OF SURGERY :               [phone]                      REMEMBER:   Do not eat food or drink liquids AFTER MIDNIGHT    Take these medicines the morning of surgery with A SIP OF WATER:  ALLOPURINOL / AMLODIPINE / LEVOTHYROXINE   Do not wear jewelry, make-up   Do not wear lotions, powders, or perfumes.   Do not shave legs or underarms 12 hrs. before surgery (men may shave face)  Do not bring valuables to the hospital.  Contacts, dentures or bridgework may not be worn into surgery.  Leave suitcase in the car. After surgery it may be brought to your room.  For patients admitted to the hospital more than one night, checkout time is 11:00                          The day of discharge.   Patients discharged the day of surgery will not be allowed to drive home                             If going home same day of surgery, must have someone stay with you first                           24 hrs at home and arrange for some one to drive you home from hospital.    Special Instructions:   Please read over the following fact sheets that you were given:               1. MRSA  INFORMATION                      2. [place] PREPARING FOR SURGERY SHEET               3. DISCONTINUE ASPIRIN AND HERBAL MEDICATIONS 7 DAYS PREOP                                                X_____________________________________________________________________        Failure to follow these instructions may result in cancellation of your surgery

## 2012-09-18 NOTE — Telephone Encounter (Signed)
gv and printed appt sched and avs for pt for June...emailed MW to add tx...emailed Dr. Clelia Croft for appt slot on 7.8.14.Marland KitchenMarland Kitchenpt aware cs will contact with d/t for PET

## 2012-09-19 ENCOUNTER — Telehealth: Payer: Self-pay | Admitting: *Deleted

## 2012-09-19 NOTE — Telephone Encounter (Signed)
Per staff message and POF I have scheduled appts.  JMW  

## 2012-09-21 ENCOUNTER — Encounter (HOSPITAL_COMMUNITY)
Admission: RE | Admit: 2012-09-21 | Discharge: 2012-09-21 | Disposition: A | Discharge status: Home / Self Care | Payer: Medicare Other | Source: Ambulatory Visit | Attending: Oncology | Admitting: Oncology

## 2012-09-21 ENCOUNTER — Other Ambulatory Visit: Payer: Self-pay | Admitting: Oncology

## 2012-09-21 DIAGNOSIS — N281 Cyst of kidney, acquired: Secondary | ICD-10-CM | POA: Insufficient documentation

## 2012-09-21 DIAGNOSIS — I714 Abdominal aortic aneurysm, without rupture, unspecified: Secondary | ICD-10-CM | POA: Insufficient documentation

## 2012-09-21 DIAGNOSIS — I2510 Atherosclerotic heart disease of native coronary artery without angina pectoris: Secondary | ICD-10-CM | POA: Insufficient documentation

## 2012-09-21 DIAGNOSIS — K5730 Diverticulosis of large intestine without perforation or abscess without bleeding: Secondary | ICD-10-CM | POA: Insufficient documentation

## 2012-09-21 DIAGNOSIS — C20 Malignant neoplasm of rectum: Secondary | ICD-10-CM

## 2012-09-21 DIAGNOSIS — I700 Atherosclerosis of aorta: Secondary | ICD-10-CM | POA: Insufficient documentation

## 2012-09-21 DIAGNOSIS — C787 Secondary malignant neoplasm of liver and intrahepatic bile duct: Secondary | ICD-10-CM | POA: Insufficient documentation

## 2012-09-21 MED ORDER — FLUDEOXYGLUCOSE F - 18 (FDG) INJECTION
19.5000 | Freq: Once | INTRAVENOUS | Status: AC | PRN
  Administered 2012-09-21: 19.5 via INTRAVENOUS

## 2012-09-24 ENCOUNTER — Ambulatory Visit (HOSPITAL_COMMUNITY): Payer: Medicare Other

## 2012-09-24 ENCOUNTER — Encounter (HOSPITAL_COMMUNITY): Payer: Self-pay | Admitting: Certified Registered Nurse Anesthetist

## 2012-09-24 ENCOUNTER — Encounter (HOSPITAL_COMMUNITY)
Admission: RE | Disposition: A | Discharge status: Home / Self Care | Payer: Self-pay | Source: Ambulatory Visit | Attending: General Surgery

## 2012-09-24 ENCOUNTER — Encounter (HOSPITAL_COMMUNITY): Payer: Self-pay | Admitting: *Deleted

## 2012-09-24 ENCOUNTER — Ambulatory Visit (HOSPITAL_COMMUNITY)
Admission: RE | Admit: 2012-09-24 | Discharge: 2012-09-24 | Disposition: A | Discharge status: Home / Self Care | Payer: Medicare Other | Source: Ambulatory Visit | Attending: General Surgery | Admitting: General Surgery

## 2012-09-24 ENCOUNTER — Ambulatory Visit (HOSPITAL_COMMUNITY): Payer: Medicare Other | Admitting: Certified Registered Nurse Anesthetist

## 2012-09-24 DIAGNOSIS — Z9221 Personal history of antineoplastic chemotherapy: Secondary | ICD-10-CM | POA: Insufficient documentation

## 2012-09-24 DIAGNOSIS — E785 Hyperlipidemia, unspecified: Secondary | ICD-10-CM | POA: Insufficient documentation

## 2012-09-24 DIAGNOSIS — Z79899 Other long term (current) drug therapy: Secondary | ICD-10-CM | POA: Insufficient documentation

## 2012-09-24 DIAGNOSIS — C787 Secondary malignant neoplasm of liver and intrahepatic bile duct: Secondary | ICD-10-CM | POA: Insufficient documentation

## 2012-09-24 DIAGNOSIS — C20 Malignant neoplasm of rectum: Secondary | ICD-10-CM

## 2012-09-24 DIAGNOSIS — R918 Other nonspecific abnormal finding of lung field: Secondary | ICD-10-CM | POA: Insufficient documentation

## 2012-09-24 DIAGNOSIS — I10 Essential (primary) hypertension: Secondary | ICD-10-CM | POA: Insufficient documentation

## 2012-09-24 HISTORY — PX: PORTACATH PLACEMENT: SHX2246

## 2012-09-24 SURGERY — INSERTION, TUNNELED CENTRAL VENOUS DEVICE, WITH PORT
Anesthesia: General | Site: Chest | Wound class: Clean

## 2012-09-24 MED ORDER — ACETAMINOPHEN 650 MG RE SUPP
650.0000 mg | RECTAL | Status: DC | PRN
  Filled 2012-09-24: qty 1

## 2012-09-24 MED ORDER — LIDOCAINE HCL (CARDIAC) 20 MG/ML IV SOLN
INTRAVENOUS | Status: DC | PRN
  Administered 2012-09-24: 100 mg via INTRAVENOUS

## 2012-09-24 MED ORDER — BUPIVACAINE HCL (PF) 0.25 % IJ SOLN
INTRAMUSCULAR | Status: AC
  Filled 2012-09-24: qty 30

## 2012-09-24 MED ORDER — PROMETHAZINE HCL 25 MG/ML IJ SOLN: 6.2500 mg | INTRAMUSCULAR | Status: DC | PRN

## 2012-09-24 MED ORDER — SODIUM CHLORIDE 0.9 % IV SOLN: 250.0000 mL | INTRAVENOUS | Status: DC | PRN

## 2012-09-24 MED ORDER — SODIUM CHLORIDE 0.9 % IJ SOLN: 3.0000 mL | Freq: Two times a day (BID) | INTRAMUSCULAR | Status: DC

## 2012-09-24 MED ORDER — SODIUM CHLORIDE 0.9 % IJ SOLN: 3.0000 mL | INTRAMUSCULAR | Status: DC | PRN

## 2012-09-24 MED ORDER — OXYCODONE HCL 5 MG PO TABS: 5.0000 mg | ORAL_TABLET | ORAL | Status: DC | PRN

## 2012-09-24 MED ORDER — OXYCODONE-ACETAMINOPHEN 5-325 MG PO TABS: 1.0000 | ORAL_TABLET | ORAL | Status: DC | PRN

## 2012-09-24 MED ORDER — LIDOCAINE-EPINEPHRINE (PF) 1 %-1:200000 IJ SOLN
INTRAMUSCULAR | Status: DC | PRN
  Administered 2012-09-24: 10 mL

## 2012-09-24 MED ORDER — CHLORHEXIDINE GLUCONATE 4 % EX LIQD
1.0000 "application " | Freq: Once | CUTANEOUS | Status: DC
  Filled 2012-09-24: qty 15

## 2012-09-24 MED ORDER — DEXAMETHASONE SODIUM PHOSPHATE 10 MG/ML IJ SOLN
INTRAMUSCULAR | Status: DC | PRN
  Administered 2012-09-24: 10 mg via INTRAVENOUS

## 2012-09-24 MED ORDER — FENTANYL CITRATE 0.05 MG/ML IJ SOLN
INTRAMUSCULAR | Status: AC
  Filled 2012-09-24: qty 2

## 2012-09-24 MED ORDER — FENTANYL CITRATE 0.05 MG/ML IJ SOLN
INTRAMUSCULAR | Status: DC | PRN
  Administered 2012-09-24: 50 ug via INTRAVENOUS
  Administered 2012-09-24 (×2): 25 ug via INTRAVENOUS

## 2012-09-24 MED ORDER — HEPARIN SOD (PORK) LOCK FLUSH 100 UNIT/ML IV SOLN
INTRAVENOUS | Status: DC | PRN
  Administered 2012-09-24: 500 [IU]

## 2012-09-24 MED ORDER — SODIUM CHLORIDE 0.9 % IR SOLN
Freq: Once | Status: AC
  Administered 2012-09-24: 08:00:00
  Filled 2012-09-24: qty 1.2

## 2012-09-24 MED ORDER — HEPARIN SOD (PORK) LOCK FLUSH 100 UNIT/ML IV SOLN
INTRAVENOUS | Status: AC
  Filled 2012-09-24: qty 10

## 2012-09-24 MED ORDER — CEFAZOLIN SODIUM-DEXTROSE 2-3 GM-% IV SOLR
INTRAVENOUS | Status: AC
  Filled 2012-09-24: qty 50

## 2012-09-24 MED ORDER — ACETAMINOPHEN 325 MG PO TABS: 650.0000 mg | ORAL_TABLET | ORAL | Status: DC | PRN

## 2012-09-24 MED ORDER — CEFAZOLIN SODIUM-DEXTROSE 2-3 GM-% IV SOLR
2.0000 g | INTRAVENOUS | Status: AC
  Administered 2012-09-24: 2 g via INTRAVENOUS

## 2012-09-24 MED ORDER — BUPIVACAINE-EPINEPHRINE PF 0.25-1:200000 % IJ SOLN
INTRAMUSCULAR | Status: AC
  Filled 2012-09-24: qty 30

## 2012-09-24 MED ORDER — BUPIVACAINE HCL (PF) 0.25 % IJ SOLN
INTRAMUSCULAR | Status: DC | PRN
  Administered 2012-09-24: 10 mL

## 2012-09-24 MED ORDER — EPHEDRINE SULFATE 50 MG/ML IJ SOLN
INTRAMUSCULAR | Status: DC | PRN
  Administered 2012-09-24: 10 mg via INTRAVENOUS
  Administered 2012-09-24: 5 mg via INTRAVENOUS
  Administered 2012-09-24 (×2): 10 mg via INTRAVENOUS

## 2012-09-24 MED ORDER — LACTATED RINGERS IV SOLN
INTRAVENOUS | Status: DC | PRN
  Administered 2012-09-24: 07:00:00 via INTRAVENOUS

## 2012-09-24 MED ORDER — LIDOCAINE-EPINEPHRINE 1 %-1:100000 IJ SOLN
INTRAMUSCULAR | Status: AC
  Filled 2012-09-24: qty 1

## 2012-09-24 MED ORDER — LIDOCAINE HCL 1 % IJ SOLN
INTRAMUSCULAR | Status: AC
  Filled 2012-09-24: qty 20

## 2012-09-24 MED ORDER — PROPOFOL 10 MG/ML IV BOLUS
INTRAVENOUS | Status: DC | PRN
  Administered 2012-09-24: 150 mg via INTRAVENOUS
  Administered 2012-09-24: 30 mg via INTRAVENOUS
  Administered 2012-09-24: 20 mg via INTRAVENOUS

## 2012-09-24 MED ORDER — FENTANYL CITRATE 0.05 MG/ML IJ SOLN
25.0000 ug | INTRAMUSCULAR | Status: DC | PRN
  Administered 2012-09-24 (×3): 25 ug via INTRAVENOUS

## 2012-09-24 MED ORDER — ONDANSETRON HCL 4 MG/2ML IJ SOLN
INTRAMUSCULAR | Status: DC | PRN
  Administered 2012-09-24: 4 mg via INTRAVENOUS

## 2012-09-24 MED ORDER — ONDANSETRON HCL 4 MG/2ML IJ SOLN: 4.0000 mg | Freq: Four times a day (QID) | INTRAMUSCULAR | Status: DC | PRN

## 2012-09-24 MED ORDER — PHENYLEPHRINE HCL 10 MG/ML IJ SOLN
INTRAMUSCULAR | Status: DC | PRN
  Administered 2012-09-24 (×4): 80 ug via INTRAVENOUS

## 2012-09-24 SURGICAL SUPPLY — 48 items
ADH SKN CLS APL DERMABOND .7 (GAUZE/BANDAGES/DRESSINGS) ×1
BLADE HEX COATED 2.75 (ELECTRODE) ×2 IMPLANT
BLADE SURG 15 STRL LF DISP TIS (BLADE) ×1 IMPLANT
BLADE SURG 15 STRL SS (BLADE) ×2
BLADE SURG SZ11 CARB STEEL (BLADE) ×2 IMPLANT
CANISTER SUCTION 2500CC (MISCELLANEOUS) ×2 IMPLANT
CHLORAPREP W/TINT 26ML (MISCELLANEOUS) ×2 IMPLANT
CLOTH BEACON ORANGE TIMEOUT ST (SAFETY) ×2 IMPLANT
DECANTER SPIKE VIAL GLASS SM (MISCELLANEOUS) ×2 IMPLANT
DERMABOND ADVANCED (GAUZE/BANDAGES/DRESSINGS) ×1
DERMABOND ADVANCED .7 DNX12 (GAUZE/BANDAGES/DRESSINGS) ×1 IMPLANT
DRAPE C-ARM 42X120 X-RAY (DRAPES) ×2 IMPLANT
DRAPE PED LAPAROTOMY (DRAPES) ×2 IMPLANT
DRSG TEGADERM 4X4.75 (GAUZE/BANDAGES/DRESSINGS) ×1 IMPLANT
ELECT REM PT RETURN 9FT ADLT (ELECTROSURGICAL) ×2
ELECTRODE REM PT RTRN 9FT ADLT (ELECTROSURGICAL) ×1 IMPLANT
GAUZE SPONGE 2X2 8PLY STRL LF (GAUZE/BANDAGES/DRESSINGS) IMPLANT
GAUZE SPONGE 4X4 16PLY XRAY LF (GAUZE/BANDAGES/DRESSINGS) ×1 IMPLANT
GLOVE BIO SURGEON STRL SZ 6 (GLOVE) ×4 IMPLANT
GLOVE BIOGEL PI IND STRL 6.5 (GLOVE) ×1 IMPLANT
GLOVE BIOGEL PI IND STRL 7.0 (GLOVE) ×1 IMPLANT
GLOVE BIOGEL PI IND STRL 7.5 (GLOVE) IMPLANT
GLOVE BIOGEL PI INDICATOR 6.5 (GLOVE) ×1
GLOVE BIOGEL PI INDICATOR 7.0 (GLOVE)
GLOVE BIOGEL PI INDICATOR 7.5 (GLOVE) ×1
GLOVE INDICATOR 6.5 STRL GRN (GLOVE) ×2 IMPLANT
GLOVE SURG SS PI 7.5 STRL IVOR (GLOVE) ×1 IMPLANT
GOWN PREVENTION PLUS XXLARGE (GOWN DISPOSABLE) ×2 IMPLANT
KIT BASIN OR (CUSTOM PROCEDURE TRAY) ×2 IMPLANT
KIT PORT POWER 8FR ISP CVUE (Catheter) ×2 IMPLANT
KIT POWER CATH 8FR (Catheter) ×2 IMPLANT
NDL HYPO 25X1 1.5 SAFETY (NEEDLE) IMPLANT
NEEDLE HYPO 22GX1.5 SAFETY (NEEDLE) ×2 IMPLANT
NEEDLE HYPO 25X1 1.5 SAFETY (NEEDLE) IMPLANT
PACK UNIVERSAL I (CUSTOM PROCEDURE TRAY) ×2 IMPLANT
PENCIL BUTTON HOLSTER BLD 10FT (ELECTRODE) ×2 IMPLANT
SPONGE GAUZE 2X2 STER 10/PKG (GAUZE/BANDAGES/DRESSINGS) ×1
SUT MNCRL AB 4-0 PS2 18 (SUTURE) ×2 IMPLANT
SUT PROLENE 2 0 SH DA (SUTURE) ×5 IMPLANT
SUT VIC AB 3-0 SH 27 (SUTURE) ×6
SUT VIC AB 3-0 SH 27X BRD (SUTURE) IMPLANT
SUT VIC AB 3-0 SH 27XBRD (SUTURE) ×1 IMPLANT
SYR 20CC LL (SYRINGE) ×1 IMPLANT
SYR BULB IRRIGATION 50ML (SYRINGE) IMPLANT
SYR CONTROL 10ML LL (SYRINGE) ×2 IMPLANT
SYRINGE 10CC LL (SYRINGE) ×1 IMPLANT
TOWEL OR 17X26 10 PK STRL BLUE (TOWEL DISPOSABLE) ×2 IMPLANT
YANKAUER SUCT BULB TIP 10FT TU (MISCELLANEOUS) IMPLANT

## 2012-09-24 NOTE — Op Note (Signed)
PREOPERATIVE DIAGNOSIS:  Recurrent metastatic rectal cancer     POSTOPERATIVE DIAGNOSIS:  Same     PROCEDURE: LEFT subclavian ultrasound-guided port placement, Bard   ClearVue Power Port, MRI safe, 8-French.      SURGEON:  Almond Lint, MD      ANESTHESIA:  General   FINDINGS:  Good venous return, easy flush, and tip of the catheter and   SVC 21 cm.      SPECIMEN:  None.      ESTIMATED BLOOD LOSS:  Minimal.      COMPLICATIONS:  None known.      PROCEDURE:  Pt was identified in the holding area and taken to   the operating room, where patient was placed supine on the operating room   table.  General anesthesia was induced.  Patient's arms were tucked and the upper   chest and neck were prepped and draped in sterile fashion.  Time-out was   performed according to the surgical safety check list.  When all was   correct, we continued.     The right side was approached first as the patient was left handed and had had prior right sided port.  Local anesthetic was administered over the appropriate area at the angle of the clavicle. The SCV was accessed with 2 passes of the needle.  The wire would not pass.  The needle was removed and redirected.  The vein was accessed with 1 pass of the needle.  The wire passed at this point.  There was brief ectopy.  Fluoro confirmed presence of wire in venous system.  A pocket was created and the port was placed in the pocket with 4 prolene sutures.  The catheter was tunneled to the wire exit site.  The dilator sheath would not pass at first.  It then passed, and the wire was removed.  The catheter was advanced through the sheath, but resistance was felt.  This was manipulated, and then it passed.  When fluoro was obtained, the catheter was kinked.  At this point, the catheter was removed and another pull away sheath was obtained.  The venous system was accessed again and the wire passed.  This time, the catheter was clearly in the venous system, but no blood  could be obtained.  The right IJ was accessed with 1 pass of the needle using ultrasound, and the wire and catheter passed easily, but the blood would not aspirate and the catheter would not flush.  Decision was made to switch to the left side.  The port was removed from the pocket, and the port site was closed with 3-0 vicryl deep dermal and 4-0 monocryl subcuticular.  On the left, the local anesthetic was administered over this   area at the angle of the clavicle.  The vein was accessed with 1 pass of the needle. There was good venous return and the wire passed easily with no ectopy.   Fluoroscopy was used to confirm that the wire was in the vena cava.      The patient was placed back level and the area for the pocket was anethetized   with local anesthetic.  A 3-cm transverse incision was made with a #15   blade.  Cautery was used to divide the subcutaneous tissues down to the   pectoralis muscle.  An [employer] retractor was used to elevate the skin   while a pocket was created on top of the pectoralis fascia.  The port   was placed into  the pocket to confirm that it was of adequate size.  The   catheter was preattached to the port.  The port was then secured to the   pectoralis fascia with four 2-0 Prolene sutures.  These were clamped and   not tied down yet.    The catheter was tunneled through to the wire exit   site.  The catheter was placed along the wire to determine what length it should be to be in the SVC.  The catheter was cut at 21 cm.  The tunneler sheath and dilator were passed over the wire and the dilator and wire were removed.  The catheter was advanced through the tunneler sheath and the tunneler sheath was pulled away.  Care was taken to keep the catheter in the tunneler sheath as this occurred. This was advanced and the tunneler sheath was removed.  There was good venous   return and easy flush of the catheter.  The Prolene sutures were tied   down to the pectoral fascia.   The skin was reapproximated using 3-0   Vicryl interrupted deep dermal sutures.    Fluoroscopy was used to re-confirm good position of the catheter.  The skin   was then closed using 4-0 Monocryl in a subcuticular fashion.  The port was flushed with concentrated heparin flush as well.  The wounds were then cleaned, dried, and dressed with Dermabond.  The patient was awakened from anesthesia and taken to the PACU in stable condition.  Needle, sponge, and instrument counts were correct.               Almond Lint, MD

## 2012-09-24 NOTE — H&P (View-Only) (Signed)
Chief Complaint  Patient presents with  . New Evaluation    eval liver mass    HISTORY: Pt is an 79 yo female who presents with new liver metastasis.  She had rectal cancer diagnosed in 2008.   She underwent transrectal excision by Dr. Lurene Shadow and was found to have T3N0 tumor.  APR recommended, but she declined.  She underwent chemotherapy and chemoradiation and has not had disease until now.  She did have brief bump in CEA in 2012 to 7, but this came back down.  CEA this year was 77, and scan demonstrated 8 cm central left hepatic mass.  Biopsy consistent with prior rectal cancer.  She is asymptomatic except for some soreness at biopsy site.  She denies weight loss, fatigue, changes in bowel habits, pain, nausea, or vomiting.  She goes to the gym 3 times/week.  She was a patient of Dr. Dalene Carrow and transitioned to Dr. Clelia Croft this year.  She does have a family history of breast cancer in cousin and twin sister with ovarian cancer  Past Medical History  Diagnosis Date  . Anemia   . AAA (abdominal aortic aneurysm) 2011  . Hyperlipidemia   . Hypertension   . Thyroid disease   . Shingles   . Cancer     Rectal    Past Surgical History  Procedure Laterality Date  . Abdominal hysterectomy  1961    total  . Tonsillectomy  1949  . Bunionectomy Bilateral   . Cyst on kidney removal  2008    Current Outpatient Prescriptions  Medication Sig Dispense Refill  . acetaminophen (TYLENOL) 500 MG tablet Take 1,000 mg by mouth every 6 (six) hours as needed for pain.      Marland Kitchen allopurinol (ZYLOPRIM) 150 mg TABS Take 150 mg by mouth every morning.       Marland Kitchen amLODipine (NORVASC) 5 MG tablet Take 5 mg by mouth every morning.       . baclofen (LIORESAL) 10 MG tablet Take 10 mg by mouth 2 (two) times daily.      . Biotin 5000 MCG CAPS Take 5,000 mcg by mouth every morning.       . Coenzyme Q10 (COQ-10) 200 MG CAPS Take 200 mg by mouth at bedtime.       . furosemide (LASIX) 20 MG tablet Take 20 mg by mouth every  morning.       Marland Kitchen levothyroxine (SYNTHROID, LEVOTHROID) 75 MCG tablet Take 75 mcg by mouth every morning.       . pravastatin (PRAVACHOL) 20 MG tablet Take 20 mg by mouth at bedtime.       . Prenat w/o A-FeCbGl-DSS-FA-DHA (CITRANATAL DHA PO) Take 1 tablet by mouth every morning.      . valsartan (DIOVAN) 160 MG tablet Take 160 mg by mouth every morning.        No current facility-administered medications for this visit.     Allergies  Allergen Reactions  . Shellfish Allergy Itching and Rash    Feels itching internally.  . Demerol (Meperidine)   . Meperidine Hcl     REACTION: Hypotension  . Rosuvastatin Other (See Comments)    "Makes my muscles ache and weak in the legs"  . Tramadol Hcl     REACTION: Tongue and lips swell     Family History  Problem Relation Age of Onset  . Hypertension Mother   . Heart disease Mother   . Hypertension Father   . Heart disease Father   .  Cancer Sister     ovarian  . Diabetes Sister   . Hyperlipidemia Sister   . Hypertension Sister   . Cancer Cousin     breast  . Cancer Cousin     breast     History   Social History  . Marital Status: Single    Spouse Name: N/A    Number of Children: N/A  . Years of Education: N/A   Social History Main Topics  . Smoking status: Former Smoker -- 1.00 packs/day for 20 years    Types: Cigarettes    Quit date: 04/04/1988  . Smokeless tobacco: None  . Alcohol Use: Yes     Comment: occ  . Drug Use: No  . Sexually Active: None   REVIEW OF SYSTEMS - PERTINENT POSITIVES ONLY: 12 point review of systems negative other than HPI and PMH  EXAM: Filed Vitals:   09/11/12 1024  BP: 132/66  Pulse: 89  Temp: 96.5 F (35.8 C)   Filed Weights   09/11/12 1024  Weight: 187 lb 12.8 oz (85.186 kg)     Gen:  No acute distress.  Well nourished and well groomed.   Neurological: Alert and oriented to person, place, and time. Coordination normal.  Head: Normocephalic and atraumatic.  Eyes: Conjunctivae  are normal. Pupils are equal, round, and reactive to light. No scleral icterus.  Neck: Normal range of motion. Neck supple. No tracheal deviation or thyromegaly present.  Cardiovascular: Normal rate, regular rhythm, normal heart sounds and intact distal pulses.  Exam reveals no gallop and no friction rub.  No murmur heard. Respiratory: Effort normal.  No respiratory distress. No chest wall tenderness. Breath sounds normal.  No wheezes, rales or rhonchi.  GI: Soft. Bowel sounds are normal. The abdomen is soft and nondistended. There is mild RUQ focal tenderness and fullness.  There is no rebound and no guarding. Lower midline incision. Musculoskeletal: Normal range of motion. Extremities are nontender.  Lymphadenopathy: No cervical, preauricular, postauricular or axillary adenopathy is present Skin: Skin is warm and dry. No rash noted. No diaphoresis. No erythema. No pallor. No clubbing, cyanosis, or edema.   Psychiatric: Normal mood and affect. Behavior is normal. Judgment and thought content normal.    LABORATORY RESULTS: Available labs are reviewed  Pertinent labs Cr 2.1, HCT 28.6, CEA 77.8, Alb 3.4, AST 61  RADIOLOGY RESULTS: See E-Chart or I-Site for most recent results.  Images and reports are reviewed. CT chest IMPRESSION:  1. No evidence of thoracic metastasis.  2. Bilateral small sub 5 mm pulmonary nodules are stable compared  to prior. Recommend attention on follow-up.   Ct abd IMPRESSION:  1. New large 8 cm mass within the central left hepatic lobe (  segment IVb). This is concerning for solitary metastasis. This  lesion would be amendable to ultrasound-guided percutaneous biopsy.  2. No evidence of additional metastatic disease or local  recurrence. Consider restaging FDG PET scan.  These results will be called to the ordering clinician or  representative by the Radiologist Assistant, and communication  documented in the PACS Dashboard    ASSESSMENT AND PLAN: Rectal  adenocarcinoma metastatic to liver Will plan chemotherapy up front.  I am very concerned about her borderline kidney function and age.    I am unable to get contrasted CT. Pt refuses MR due to her aortic stent graft.  I am concerned about the remainder of the liver having occult metastases. I discussed the patient with Dr. Clelia Croft.   Plan will  be PET, Port a cath, chemotherapy, then reassess CEA and imaging.  Follow up after/toward end of short course chemotherapy.    Counseling, coordination of care/exam 30 min total visit, with counseling >50%     Maudry Diego MD Surgical Oncology, General and Endocrine Surgery [facility]      Visit Diagnoses: 1. Rectal adenocarcinoma metastatic to liver     Primary Care Physician: [name], MD

## 2012-09-24 NOTE — Interval H&P Note (Signed)
History and Physical Interval Note:  09/24/2012 7:33 AM  Ashley Davenport  has presented today for surgery, with the diagnosis of RECTAL CANCER   The various methods of treatment have been discussed with the patient and family. After consideration of risks, benefits and other options for treatment, the patient has consented to  Procedure(s): INSERTION PORT-A-CATH (N/A) as a surgical intervention .  The patient's history has been reviewed, patient examined, no change in status, stable for surgery.  I have reviewed the patient's chart and labs.  Questions were answered to the patient's satisfaction.     Jilene Spohr

## 2012-09-24 NOTE — Preoperative (Signed)
Beta Blockers   Reason not to administer Beta Blockers:Not Applicable 

## 2012-09-24 NOTE — Transfer of Care (Signed)
Immediate Anesthesia Transfer of Care Note  Patient: Ashley Davenport  Procedure(s) Performed: Procedure(s) (LRB): INSERTION PORT-A-CATH (N/A)  Patient Location: PACU  Anesthesia Type: General  Level of Consciousness: sedated, patient cooperative and responds to stimulaton  Airway & Oxygen Therapy: Patient Spontanous Breathing and Patient connected to face mask oxgen  Post-op Assessment: Report given to PACU RN and Post -op Vital signs reviewed and stable  Post vital signs: Reviewed and stable  Complications: No apparent anesthesia complications

## 2012-09-24 NOTE — Anesthesia Preprocedure Evaluation (Signed)
Anesthesia Evaluation  Patient identified by MRN, date of birth, ID band Patient awake    Reviewed: Allergy & Precautions, H&P , NPO status , Patient's Chart, lab work & pertinent test results  Airway Mallampati: II TM Distance: >3 FB Neck ROM: Full    Dental no notable dental hx.    Pulmonary neg pulmonary ROS,  breath sounds clear to auscultation  Pulmonary exam normal       Cardiovascular hypertension, Pt. on medications + Peripheral Vascular Disease Rhythm:Regular Rate:Normal  AAA (abdominal aortic aneurysm) 2011    Neuro/Psych negative neurological ROS  negative psych ROS   GI/Hepatic negative GI ROS, Neg liver ROS,   Endo/Other  Hypothyroidism   Renal/GU CRFRenal disease  negative genitourinary   Musculoskeletal negative musculoskeletal ROS (+)   Abdominal   Peds negative pediatric ROS (+)  Hematology  (+) Blood dyscrasia, anemia ,   Anesthesia Other Findings   Reproductive/Obstetrics negative OB ROS                           Anesthesia Physical Anesthesia Plan  ASA: III  Anesthesia Plan: General   Post-op Pain Management:    Induction: Intravenous  Airway Management Planned: LMA  Additional Equipment:   Intra-op Plan:   Post-operative Plan:   Informed Consent: I have reviewed the patients History and Physical, chart, labs and discussed the procedure including the risks, benefits and alternatives for the proposed anesthesia with the patient or authorized representative who has indicated his/her understanding and acceptance.   Dental advisory given  Plan Discussed with: CRNA and Surgeon  Anesthesia Plan Comments:         Anesthesia Quick Evaluation

## 2012-09-24 NOTE — Anesthesia Postprocedure Evaluation (Signed)
  Anesthesia Post-op Note  Patient: Ashley Davenport  Procedure(s) Performed: Procedure(s) (LRB): INSERTION PORT-A-CATH (N/A)  Patient Location: PACU  Anesthesia Type: General  Level of Consciousness: awake and alert   Airway and Oxygen Therapy: Patient Spontanous Breathing  Post-op Pain: mild  Post-op Assessment: Post-op Vital signs reviewed, Patient's Cardiovascular Status Stable, Respiratory Function Stable, Patent Airway and No signs of Nausea or vomiting  Last Vitals:  Filed Vitals:   09/24/12 0949  BP: 128/86  Pulse: 63  Temp: 36.4 C  Resp: 20    Post-op Vital Signs: stable   Complications: No apparent anesthesia complications

## 2012-09-25 ENCOUNTER — Ambulatory Visit (HOSPITAL_BASED_OUTPATIENT_CLINIC_OR_DEPARTMENT_OTHER): Payer: Medicare Other

## 2012-09-25 ENCOUNTER — Other Ambulatory Visit (HOSPITAL_BASED_OUTPATIENT_CLINIC_OR_DEPARTMENT_OTHER): Payer: Medicare Other | Admitting: Lab

## 2012-09-25 ENCOUNTER — Encounter (HOSPITAL_COMMUNITY): Payer: Self-pay | Admitting: General Surgery

## 2012-09-25 VITALS — BP 144/74 | HR 99 | Temp 97.1°F | Resp 20

## 2012-09-25 DIAGNOSIS — C20 Malignant neoplasm of rectum: Secondary | ICD-10-CM

## 2012-09-25 DIAGNOSIS — Z5111 Encounter for antineoplastic chemotherapy: Secondary | ICD-10-CM

## 2012-09-25 DIAGNOSIS — C787 Secondary malignant neoplasm of liver and intrahepatic bile duct: Secondary | ICD-10-CM

## 2012-09-25 LAB — CBC WITH DIFFERENTIAL/PLATELET
Basophils Absolute: 0 10*3/uL (ref 0.0–0.1)
EOS%: 0 % (ref 0.0–7.0)
HCT: 25.2 % — ABNORMAL LOW (ref 34.8–46.6)
HGB: 8.4 g/dL — ABNORMAL LOW (ref 11.6–15.9)
MCH: 29.9 pg (ref 25.1–34.0)
MCV: 89.9 fL (ref 79.5–101.0)
MONO%: 6.9 % (ref 0.0–14.0)
NEUT%: 90.2 % — ABNORMAL HIGH (ref 38.4–76.8)

## 2012-09-25 LAB — COMPREHENSIVE METABOLIC PANEL (CC13)
AST: 47 U/L — ABNORMAL HIGH (ref 5–34)
Alkaline Phosphatase: 55 U/L (ref 40–150)
BUN: 43.4 mg/dL — ABNORMAL HIGH (ref 7.0–26.0)
Calcium: 9.5 mg/dL (ref 8.4–10.4)
Creatinine: 2.6 mg/dL — ABNORMAL HIGH (ref 0.6–1.1)
Total Bilirubin: 0.22 mg/dL (ref 0.20–1.20)

## 2012-09-25 MED ORDER — ONDANSETRON 16 MG/50ML IVPB (CHCC)
16.0000 mg | Freq: Once | INTRAVENOUS | Status: AC
  Administered 2012-09-25: 16 mg via INTRAVENOUS

## 2012-09-25 MED ORDER — ATROPINE SULFATE 1 MG/ML IJ SOLN
0.5000 mg | Freq: Once | INTRAMUSCULAR | Status: AC | PRN
  Administered 2012-09-25: 0.5 mg via INTRAVENOUS

## 2012-09-25 MED ORDER — DEXAMETHASONE SODIUM PHOSPHATE 20 MG/5ML IJ SOLN
20.0000 mg | Freq: Once | INTRAMUSCULAR | Status: AC
  Administered 2012-09-25: 20 mg via INTRAVENOUS

## 2012-09-25 MED ORDER — FLUOROURACIL CHEMO INJECTION 2.5 GM/50ML
400.0000 mg/m2 | Freq: Once | INTRAVENOUS | Status: AC
  Administered 2012-09-25: 800 mg via INTRAVENOUS
  Filled 2012-09-25: qty 16

## 2012-09-25 MED ORDER — IRINOTECAN HCL CHEMO INJECTION 100 MG/5ML
182.0000 mg/m2 | Freq: Once | INTRAVENOUS | Status: AC
  Administered 2012-09-25: 360 mg via INTRAVENOUS
  Filled 2012-09-25: qty 18

## 2012-09-25 MED ORDER — LEUCOVORIN CALCIUM INJECTION 350 MG
404.0000 mg/m2 | Freq: Once | INTRAVENOUS | Status: AC
  Administered 2012-09-25: 800 mg via INTRAVENOUS
  Filled 2012-09-25: qty 40

## 2012-09-25 MED ORDER — SODIUM CHLORIDE 0.9 % IV SOLN
2400.0000 mg/m2 | INTRAVENOUS | Status: DC
  Administered 2012-09-25: 4750 mg via INTRAVENOUS
  Filled 2012-09-25: qty 95

## 2012-09-25 MED ORDER — SODIUM CHLORIDE 0.9 % IV SOLN
Freq: Once | INTRAVENOUS | Status: AC
  Administered 2012-09-25: 11:00:00 via INTRAVENOUS

## 2012-09-25 NOTE — Patient Instructions (Addendum)
[facility] Discharge Instructions for Patients Receiving Chemotherapy  Today you received the following chemotherapy agents Camptosar, Leucovorin and 5FU.  To help prevent nausea and vomiting after your treatment, we encourage you to take your nausea medication.   If you develop nausea and vomiting that is not controlled by your nausea medication, call the clinic.   BELOW ARE SYMPTOMS THAT SHOULD BE REPORTED IMMEDIATELY:  *FEVER GREATER THAN 100.5 F  *CHILLS WITH OR WITHOUT FEVER  NAUSEA AND VOMITING THAT IS NOT CONTROLLED WITH YOUR NAUSEA MEDICATION  *UNUSUAL SHORTNESS OF BREATH  *UNUSUAL BRUISING OR BLEEDING  TENDERNESS IN MOUTH AND THROAT WITH OR WITHOUT PRESENCE OF ULCERS  *URINARY PROBLEMS  *BOWEL PROBLEMS  UNUSUAL RASH Items with * indicate a potential emergency and should be followed up as soon as possible.  Feel free to call the clinic you have any questions or concerns. The clinic phone number is [phone].    

## 2012-09-26 ENCOUNTER — Telehealth: Payer: Self-pay | Admitting: *Deleted

## 2012-09-26 NOTE — Telephone Encounter (Signed)
Called pt for f/u of chemo done yest.  She reports that she is doing well without any c/o's other than soreness in her right chest where they tried to get port in.  She does have pain med & nausea med.  She also reports no problems with her pump & will be in tomorrow for pump d/c.  She knows she can call with questions or concerns.

## 2012-09-27 ENCOUNTER — Ambulatory Visit (HOSPITAL_BASED_OUTPATIENT_CLINIC_OR_DEPARTMENT_OTHER): Payer: Medicare Other

## 2012-09-27 VITALS — BP 137/71 | HR 74 | Temp 97.1°F | Resp 18

## 2012-09-27 DIAGNOSIS — C787 Secondary malignant neoplasm of liver and intrahepatic bile duct: Secondary | ICD-10-CM

## 2012-09-27 DIAGNOSIS — C20 Malignant neoplasm of rectum: Secondary | ICD-10-CM

## 2012-09-27 DIAGNOSIS — Z452 Encounter for adjustment and management of vascular access device: Secondary | ICD-10-CM

## 2012-09-27 MED ORDER — SODIUM CHLORIDE 0.9 % IJ SOLN
10.0000 mL | INTRAMUSCULAR | Status: DC | PRN
  Administered 2012-09-27: 10 mL
  Filled 2012-09-27: qty 10

## 2012-09-27 MED ORDER — HEPARIN SOD (PORK) LOCK FLUSH 100 UNIT/ML IV SOLN
500.0000 [IU] | Freq: Once | INTRAVENOUS | Status: AC | PRN
  Administered 2012-09-27: 500 [IU]
  Filled 2012-09-27: qty 5

## 2012-09-27 NOTE — Patient Instructions (Signed)
Call MD for problems or concerns 

## 2012-10-09 ENCOUNTER — Ambulatory Visit (HOSPITAL_BASED_OUTPATIENT_CLINIC_OR_DEPARTMENT_OTHER): Payer: Medicare Other

## 2012-10-09 ENCOUNTER — Telehealth: Payer: Self-pay | Admitting: *Deleted

## 2012-10-09 ENCOUNTER — Ambulatory Visit (HOSPITAL_BASED_OUTPATIENT_CLINIC_OR_DEPARTMENT_OTHER): Payer: Medicare Other | Admitting: Oncology

## 2012-10-09 ENCOUNTER — Ambulatory Visit (HOSPITAL_BASED_OUTPATIENT_CLINIC_OR_DEPARTMENT_OTHER): Payer: Medicare Other | Admitting: Lab

## 2012-10-09 VITALS — BP 120/64 | HR 91 | Temp 98.1°F | Resp 20 | Ht 64.0 in | Wt 185.5 lb

## 2012-10-09 DIAGNOSIS — D709 Neutropenia, unspecified: Secondary | ICD-10-CM

## 2012-10-09 DIAGNOSIS — C20 Malignant neoplasm of rectum: Secondary | ICD-10-CM

## 2012-10-09 DIAGNOSIS — Z5111 Encounter for antineoplastic chemotherapy: Secondary | ICD-10-CM

## 2012-10-09 DIAGNOSIS — D631 Anemia in chronic kidney disease: Secondary | ICD-10-CM

## 2012-10-09 DIAGNOSIS — N189 Chronic kidney disease, unspecified: Secondary | ICD-10-CM

## 2012-10-09 DIAGNOSIS — N039 Chronic nephritic syndrome with unspecified morphologic changes: Secondary | ICD-10-CM

## 2012-10-09 DIAGNOSIS — Z5112 Encounter for antineoplastic immunotherapy: Secondary | ICD-10-CM

## 2012-10-09 DIAGNOSIS — C787 Secondary malignant neoplasm of liver and intrahepatic bile duct: Secondary | ICD-10-CM

## 2012-10-09 LAB — CBC WITH DIFFERENTIAL/PLATELET
Basophils Absolute: 0 10*3/uL (ref 0.0–0.1)
Eosinophils Absolute: 0.3 10*3/uL (ref 0.0–0.5)
HGB: 8.3 g/dL — ABNORMAL LOW (ref 11.6–15.9)
MCV: 90.2 fL (ref 79.5–101.0)
MONO%: 18.4 % — ABNORMAL HIGH (ref 0.0–14.0)
NEUT#: 0.7 10*3/uL — ABNORMAL LOW (ref 1.5–6.5)
Platelets: 460 10*3/uL — ABNORMAL HIGH (ref 145–400)
RDW: 15.2 % — ABNORMAL HIGH (ref 11.2–14.5)

## 2012-10-09 LAB — COMPREHENSIVE METABOLIC PANEL (CC13)
Albumin: 2.9 g/dL — ABNORMAL LOW (ref 3.5–5.0)
CO2: 20 mEq/L — ABNORMAL LOW (ref 22–29)
Calcium: 9 mg/dL (ref 8.4–10.4)
Glucose: 117 mg/dl (ref 70–140)
Sodium: 138 mEq/L (ref 136–145)
Total Bilirubin: <0.2 mg/dL (ref 0.20–1.20)
Total Protein: 6.7 g/dL (ref 6.4–8.3)

## 2012-10-09 MED ORDER — DEXAMETHASONE SODIUM PHOSPHATE 20 MG/5ML IJ SOLN
20.0000 mg | Freq: Once | INTRAMUSCULAR | Status: AC
  Administered 2012-10-09: 20 mg via INTRAVENOUS

## 2012-10-09 MED ORDER — BEVACIZUMAB CHEMO INJECTION 400 MG/16ML
5.0000 mg/kg | Freq: Once | INTRAVENOUS | Status: AC
  Administered 2012-10-09: 425 mg via INTRAVENOUS
  Filled 2012-10-09: qty 17

## 2012-10-09 MED ORDER — FLUOROURACIL CHEMO INJECTION 5 GM/100ML
2400.0000 mg/m2 | INTRAVENOUS | Status: DC
  Administered 2012-10-09: 4750 mg via INTRAVENOUS
  Filled 2012-10-09: qty 95

## 2012-10-09 MED ORDER — SODIUM CHLORIDE 0.9 % IV SOLN
Freq: Once | INTRAVENOUS | Status: AC
  Administered 2012-10-09: 10:00:00 via INTRAVENOUS

## 2012-10-09 MED ORDER — ATROPINE SULFATE 0.4 MG/ML IJ SOLN
0.5000 mg | Freq: Once | INTRAMUSCULAR | Status: AC | PRN
  Administered 2012-10-09: 0.5 mg via INTRAVENOUS
  Filled 2012-10-09: qty 1.25

## 2012-10-09 MED ORDER — FLUOROURACIL CHEMO INJECTION 2.5 GM/50ML
400.0000 mg/m2 | Freq: Once | INTRAVENOUS | Status: AC
  Administered 2012-10-09: 800 mg via INTRAVENOUS
  Filled 2012-10-09: qty 16

## 2012-10-09 MED ORDER — ONDANSETRON 16 MG/50ML IVPB (CHCC)
16.0000 mg | Freq: Once | INTRAVENOUS | Status: AC
  Administered 2012-10-09: 16 mg via INTRAVENOUS

## 2012-10-09 MED ORDER — IRINOTECAN HCL CHEMO INJECTION 100 MG/5ML
180.0000 mg/m2 | Freq: Once | INTRAVENOUS | Status: AC
  Administered 2012-10-09: 356 mg via INTRAVENOUS
  Filled 2012-10-09: qty 17.8

## 2012-10-09 MED ORDER — LEUCOVORIN CALCIUM INJECTION 350 MG
400.0000 mg/m2 | Freq: Once | INTRAVENOUS | Status: AC
  Administered 2012-10-09: 792 mg via INTRAVENOUS
  Filled 2012-10-09: qty 39.6

## 2012-10-09 NOTE — Progress Notes (Signed)
Per Dr Clelia Croft, okay to tx today with CBC results 10/09/12.  SLJ

## 2012-10-09 NOTE — Telephone Encounter (Signed)
Per staff message and POF I have scheduled appts.  JMW  

## 2012-10-09 NOTE — Progress Notes (Signed)
Hematology and Oncology Follow Up Visit  Ashley Davenport [id number] 12-25-1930 79 y.o. 10/09/2012 8:51 AM Ashley Davenport, MDClark, Lindell Spar, MD   Principle Diagnosis: 79 year old woman with rectal cancer and anemia of chronic disease secondary to kidney disease. She was diagnosed with rectal cancer on  09/01/2006.  She hadT3 N0 moderately differentiated adenocarcinoma of the rectum with evidence of LV invasion. Now she has stage IV disease with liver mets.    Prior Therapy: She is status post neoadjuvant continuous infusion 5-FU with external radiation therapy between July 10, 2006 and Sep 01, 2006.  She declined abdominoperineal resection. She went on to receive additional 3 cycles of Xeloda, but developed hand-foot syndrome and declined further therapy.   Current therapy: She is on FOLFIRI + Avastin. First cycle given on 6/27 with FOLFIRI alone.   Interim History: Ashley Davenport presents today for a follow up visit. She is a very nice women with the above diagnosis. Since her last visit, she had a port placed and received her first cycle of chemotherapy without complications. She has had no issues or concerns since her last visit. Her weight is stable. Besides pain secondary to neuralgia from shingles, she has no abnormal discomfort/pain. She has had no change in bowel function.She is still living independently and able to function at a high level without any decline in her energy or performance status. She is completely asymptomatic. No Gi complications but reports grade 1-2 fatigue.     Medications: I have reviewed the patient's current medications.  Current Outpatient Prescriptions  Medication Sig Dispense Refill  . acetaminophen (TYLENOL) 500 MG tablet Take 1,000 mg by mouth every 6 (six) hours as needed for pain.      Marland Kitchen allopurinol (ZYLOPRIM) 150 mg TABS Take 150 mg by mouth every morning.       Marland Kitchen amLODipine (NORVASC) 10 MG tablet Take 10 mg by mouth every morning.      . baclofen  (LIORESAL) 10 MG tablet Take 10 mg by mouth 2 (two) times daily.      . Biotin 5000 MCG CAPS Take 5,000 mcg by mouth every morning.       . Coenzyme Q10 (COQ-10) 200 MG CAPS Take 200 mg by mouth at bedtime.       . furosemide (LASIX) 20 MG tablet Take 20 mg by mouth every morning.       Marland Kitchen levothyroxine (SYNTHROID, LEVOTHROID) 75 MCG tablet Take 75 mcg by mouth every morning.       . lidocaine-prilocaine (EMLA) cream Apply topically as needed. Apply to port before chemotherapy  30 g  0  . oxyCODONE-acetaminophen (ROXICET) 5-325 MG per tablet Take 1-2 tablets by mouth every 4 (four) hours as needed for pain.  30 tablet  0  . pravastatin (PRAVACHOL) 20 MG tablet Take 20 mg by mouth at bedtime.       . Prenat w/o A-FeCbGl-DSS-FA-DHA (CITRANATAL DHA PO) Take 1 tablet by mouth every morning.      . prochlorperazine (COMPAZINE) 10 MG tablet Take 1 tablet (10 mg total) by mouth every 6 (six) hours as needed.  30 tablet  0  . valsartan (DIOVAN) 160 MG tablet Take 160 mg by mouth every morning.        No current facility-administered medications for this visit.     Allergies:  Allergies  Allergen Reactions  . Shellfish Allergy Itching and Rash    Feels itching internally.  . Meperidine Hcl     REACTION:  Hypotension  . Rosuvastatin Other (See Comments)    "Makes my muscles ache and weak in the legs"  . Tramadol Hcl     REACTION: Tongue and lips swell    Past Medical History, Surgical history, Social history, and Family History were reviewed and updated.  Review of Systems: Constitutional:  Negative for fever, chills, night sweats, anorexia, weight loss, pain. Cardiovascular: no chest pain or dyspnea on exertion Respiratory: negative Neurological: negative Dermatological: negative ENT: negative Skin: Negative. Gastrointestinal: negative Genito-Urinary: negative Hematological and Lymphatic: negative Breast: negative Musculoskeletal: negative Remaining ROS negative. Physical  Exam: Blood pressure 120/64, pulse 91, temperature 98.1 F (36.7 C), temperature source Oral, resp. rate 20, height 5\' 4"  (1.626 m), weight 185 lb 8 oz (84.142 kg). ECOG: 0 General appearance: alert Head: Normocephalic, without obvious abnormality, atraumatic Neck: no adenopathy, no carotid bruit, no JVD, supple, symmetrical, trachea midline and thyroid not enlarged, symmetric, no tenderness/mass/nodules Lymph nodes: Cervical, supraclavicular, and axillary nodes normal. Heart:regular rate and rhythm, S1, S2 normal, no murmur, click, rub or gallop Lung:chest clear, no wheezing, rales, normal symmetric air entry Abdomin: soft, non-tender, without masses or organomegaly EXT:no erythema, induration, or nodules   Lab Results: Lab Results  Component Value Date   WBC 2.3* 10/09/2012   HGB 8.3* 10/09/2012   HCT 25.8* 10/09/2012   MCV 90.2 10/09/2012   PLT 460* 10/09/2012     Chemistry      Component Value Date/Time   NA 135* 09/25/2012 0933   NA 138 09/18/2012 1355   K 5.0 09/25/2012 0933   K 5.5* 09/18/2012 1355   CL 102 09/25/2012 0933   CL 103 09/18/2012 1355   CO2 20* 09/25/2012 0933   CO2 24 09/18/2012 1355   BUN 43.4* 09/25/2012 0933   BUN 38* 09/18/2012 1355   CREATININE 2.6* 09/25/2012 0933   CREATININE 2.23* 09/18/2012 1355      Component Value Date/Time   CALCIUM 9.5 09/25/2012 0933   CALCIUM 9.7 09/18/2012 1355   ALKPHOS 55 09/25/2012 0933   ALKPHOS 52 08/30/2011 1107   AST 47* 09/25/2012 0933   AST 21 08/30/2011 1107   ALT 14 09/25/2012 0933   ALT 18 08/30/2011 1107   BILITOT 0.22 09/25/2012 0933   BILITOT 0.3 08/30/2011 1107       Impression and Plan:  Ashley Davenport is a 79 year old woman with:  1. T3 N0 moderately differentiated adenocarcinoma of the rectum with evidence of LV invasion and positive surgical margins 05/30/2006.  She is status post neoadjuvant continuous infusion 5-FU with external radiation therapy between July 10, 2006 and Sep 01, 2006.  Now has stage IV disease and  S/P first cycle of FOLFIRI. She is to get FOLFIRI and Avastin today.  She will get 4 cycles and repeat CT scan after that.   2. Anemia: stable at this point, likely to kidney disease. I will check her iron levels and replace as needed.   3. Neutropenia: I will add Neulasta after each cycle.   4. Follow up: in two weeks.      [facility], MD 7/8/20148:51 AM

## 2012-10-09 NOTE — Patient Instructions (Addendum)
[facility] Discharge Instructions for Patients Receiving Chemotherapy  Today you received the following chemotherapy agents 5FU, leucovorin, irinotecan, avastin  To help prevent nausea and vomiting after your treatment, we encourage you to take your nausea medication as needed   If you develop nausea and vomiting that is not controlled by your nausea medication, call the clinic.   BELOW ARE SYMPTOMS THAT SHOULD BE REPORTED IMMEDIATELY:  *FEVER GREATER THAN 100.5 F  *CHILLS WITH OR WITHOUT FEVER  NAUSEA AND VOMITING THAT IS NOT CONTROLLED WITH YOUR NAUSEA MEDICATION  *UNUSUAL SHORTNESS OF BREATH  *UNUSUAL BRUISING OR BLEEDING  TENDERNESS IN MOUTH AND THROAT WITH OR WITHOUT PRESENCE OF ULCERS  *URINARY PROBLEMS  *BOWEL PROBLEMS  UNUSUAL RASH Items with * indicate a potential emergency and should be followed up as soon as possible.  Feel free to call the clinic you have any questions or concerns. The clinic phone number is [phone].   Bevacizumab injection  What is this medicine? BEVACIZUMAB (be va SIZ yoo mab) is a chemotherapy drug. It targets a protein found in many cancer cell types, and halts cancer growth. This drug treats many cancers including non-small cell lung cancer, and colon or rectal cancer. It is usually given with other chemotherapy drugs. This medicine may be used for other purposes; ask your health care provider or pharmacist if you have questions. What should I tell my health care provider before I take this medicine? They need to know if you have any of these conditions: -blood clots -heart disease, including heart failure, heart attack, or chest pain (angina) -high blood pressure -infection (especially a virus infection such as chickenpox, cold sores, or herpes) -kidney disease -lung disease -prior chemotherapy with doxorubicin, daunorubicin, epirubicin, or other anthracycline type chemotherapy agents -recent or ongoing radiation  therapy -recent surgery -stroke -an unusual or allergic reaction to bevacizumab, hamster proteins, mouse proteins, other medicines, foods, dyes, or preservatives -pregnant or trying to get pregnant -breast-feeding How should I use this medicine? This medicine is for infusion into a vein. It is given by a health care professional in a hospital or clinic setting. Talk to your pediatrician regarding the use of this medicine in children. Special care may be needed. Overdosage: If you think you have taken too much of this medicine contact a poison control center or emergency room at once. NOTE: This medicine is only for you. Do not share this medicine with others. What if I miss a dose? It is important not to miss your dose. Call your doctor or health care professional if you are unable to keep an appointment. What may interact with this medicine? Interactions are not expected. This list may not describe all possible interactions. Give your health care provider a list of all the medicines, herbs, non-prescription drugs, or dietary supplements you use. Also tell them if you smoke, drink alcohol, or use illegal drugs. Some items may interact with your medicine. What should I watch for while using this medicine? Your condition will be monitored carefully while you are receiving this medicine. You will need important blood work and urine testing done while you are taking this medicine. During your treatment, let your health care professional know if you have any unusual symptoms, such as difficulty breathing. This medicine may rarely cause 'gastrointestinal perforation' (holes in the stomach, intestines or colon), a serious side effect requiring surgery to repair. This medicine should be started at least 28 days following major surgery and the site of the surgery  should be totally healed. Check with your doctor before scheduling dental work or surgery while you are receiving this treatment. Talk to your  doctor if you have recently had surgery or if you have a wound that has not healed. Do not become pregnant while taking this medicine. Women should inform their doctor if they wish to become pregnant or think they might be pregnant. There is a potential for serious side effects to an unborn child. Talk to your health care professional or pharmacist for more information. Do not breast-feed an infant while taking this medicine. This medicine has caused ovarian failure in some women. This medicine may interfere with the ability to have a child. You should talk to your doctor or health care professional if you are concerned about your fertility. What side effects may I notice from receiving this medicine? Side effects that you should report to your doctor or health care professional as soon as possible: -allergic reactions like skin rash, itching or hives, swelling of the face, lips, or tongue -signs of infection - fever or chills, cough, sore throat, pain or trouble passing urine -signs of decreased platelets or bleeding - bruising, pinpoint red spots on the skin, black, tarry stools, nosebleeds, blood in the urine -breathing problems -changes in vision -chest pain -confusion -jaw pain, especially after dental work -mouth sores -seizures -severe abdominal pain -severe headache -sudden numbness or weakness of the face, arm or leg -swelling of legs or ankles -symptoms of a stroke: change in mental awareness, inability to talk or move one side of the body (especially in patients with lung cancer) -trouble passing urine or change in the amount of urine -trouble speaking or understanding -trouble walking, dizziness, loss of balance or coordination Side effects that usually do not require medical attention (report to your doctor or health care professional if they continue or are bothersome): -constipation -diarrhea -dry skin -headache -loss of appetite -nausea, vomiting This list may not  describe all possible side effects. Call your doctor for medical advice about side effects. You may report side effects to FDA at 1-800-FDA-1088. Where should I keep my medicine? This drug is given in a hospital or clinic and will not be stored at home. NOTE: This sheet is a summary. It may not cover all possible information. If you have questions about this medicine, talk to your doctor, pharmacist, or health care provider.  2012, [employer]. (02/19/2010 4:25:37 PM)

## 2012-10-10 ENCOUNTER — Other Ambulatory Visit: Payer: Self-pay | Admitting: Certified Registered Nurse Anesthetist

## 2012-10-11 ENCOUNTER — Ambulatory Visit (HOSPITAL_BASED_OUTPATIENT_CLINIC_OR_DEPARTMENT_OTHER): Payer: Medicare Other

## 2012-10-11 ENCOUNTER — Telehealth: Payer: Self-pay | Admitting: Oncology

## 2012-10-11 VITALS — BP 111/68 | HR 69 | Temp 97.5°F | Resp 20

## 2012-10-11 DIAGNOSIS — C20 Malignant neoplasm of rectum: Secondary | ICD-10-CM

## 2012-10-11 DIAGNOSIS — Z5189 Encounter for other specified aftercare: Secondary | ICD-10-CM

## 2012-10-11 DIAGNOSIS — C787 Secondary malignant neoplasm of liver and intrahepatic bile duct: Secondary | ICD-10-CM

## 2012-10-11 MED ORDER — PEGFILGRASTIM INJECTION 6 MG/0.6ML
6.0000 mg | Freq: Once | SUBCUTANEOUS | Status: AC
  Administered 2012-10-11: 6 mg via SUBCUTANEOUS
  Filled 2012-10-11: qty 0.6

## 2012-10-11 MED ORDER — HEPARIN SOD (PORK) LOCK FLUSH 100 UNIT/ML IV SOLN
500.0000 [IU] | Freq: Once | INTRAVENOUS | Status: AC | PRN
  Administered 2012-10-11: 500 [IU]
  Filled 2012-10-11: qty 5

## 2012-10-11 MED ORDER — SODIUM CHLORIDE 0.9 % IJ SOLN
10.0000 mL | INTRAMUSCULAR | Status: DC | PRN
  Administered 2012-10-11: 10 mL
  Filled 2012-10-11: qty 10

## 2012-10-11 NOTE — Patient Instructions (Signed)
Call MD for problems or concerns 

## 2012-10-11 NOTE — Telephone Encounter (Signed)
Talked to  pt gave  her appt for lab , Md and chemo for July 2014

## 2012-10-22 ENCOUNTER — Encounter: Payer: Self-pay | Admitting: Oncology

## 2012-10-22 ENCOUNTER — Other Ambulatory Visit (HOSPITAL_BASED_OUTPATIENT_CLINIC_OR_DEPARTMENT_OTHER): Payer: Medicare Other

## 2012-10-22 ENCOUNTER — Ambulatory Visit (HOSPITAL_BASED_OUTPATIENT_CLINIC_OR_DEPARTMENT_OTHER): Payer: Medicare Other | Admitting: Oncology

## 2012-10-22 VITALS — BP 138/71 | HR 75 | Temp 98.3°F | Resp 18 | Ht 64.0 in | Wt 187.2 lb

## 2012-10-22 DIAGNOSIS — C20 Malignant neoplasm of rectum: Secondary | ICD-10-CM

## 2012-10-22 DIAGNOSIS — C787 Secondary malignant neoplasm of liver and intrahepatic bile duct: Secondary | ICD-10-CM

## 2012-10-22 LAB — CBC WITH DIFFERENTIAL/PLATELET
EOS%: 1.8 % (ref 0.0–7.0)
LYMPH%: 12.6 % — ABNORMAL LOW (ref 14.0–49.7)
MCH: 29.9 pg (ref 25.1–34.0)
MCHC: 33.6 g/dL (ref 31.5–36.0)
MCV: 89 fL (ref 79.5–101.0)
MONO%: 5.4 % (ref 0.0–14.0)
RBC: 2.78 10*6/uL — ABNORMAL LOW (ref 3.70–5.45)
RDW: 16.2 % — ABNORMAL HIGH (ref 11.2–14.5)
nRBC: 3 % — ABNORMAL HIGH (ref 0–0)

## 2012-10-22 LAB — COMPREHENSIVE METABOLIC PANEL (CC13)
ALT: 15 U/L (ref 0–55)
AST: 24 U/L (ref 5–34)
Albumin: 2.9 g/dL — ABNORMAL LOW (ref 3.5–5.0)
Alkaline Phosphatase: 77 U/L (ref 40–150)
Glucose: 120 mg/dl (ref 70–140)
Potassium: 4.2 mEq/L (ref 3.5–5.1)
Sodium: 137 mEq/L (ref 136–145)
Total Protein: 6.3 g/dL — ABNORMAL LOW (ref 6.4–8.3)

## 2012-10-22 LAB — CEA: CEA: 22.1 ng/mL — ABNORMAL HIGH (ref 0.0–5.0)

## 2012-10-22 MED ORDER — PROCHLORPERAZINE MALEATE 10 MG PO TABS: 10.0000 mg | ORAL_TABLET | Freq: Four times a day (QID) | ORAL | Status: DC | PRN

## 2012-10-22 MED ORDER — ONDANSETRON HCL 8 MG PO TABS: 8.0000 mg | ORAL_TABLET | Freq: Three times a day (TID) | ORAL | Status: DC | PRN

## 2012-10-22 NOTE — Progress Notes (Signed)
Hematology and Oncology Follow Up Visit  Ashley Davenport [id number] Apr 16, 1930 79 y.o. 10/22/2012 1:02 PM [name], MDClark, Lindell Spar, MD   Principle Diagnosis: 79 year old woman with rectal cancer and anemia of chronic disease secondary to kidney disease. She was diagnosed with rectal cancer on  09/01/2006.  She hadT3 N0 moderately differentiated adenocarcinoma of the rectum with evidence of LV invasion. Now she has stage IV disease with liver mets.    Prior Therapy: She is status post neoadjuvant continuous infusion 5-FU with external radiation therapy between July 10, 2006 and Sep 01, 2006.  She declined abdominoperineal resection. She went on to receive additional 3 cycles of Xeloda, but developed hand-foot syndrome and declined further therapy.   Current therapy: She is on FOLFIRI + Avastin. First cycle given on 6/27 with FOLFIRI alone. She is due for cycle 3 of her chemotherapy on 10/23/2012  Interim History: Ashley Davenport presents today for a follow up visit. She is a very nice women with the above diagnosis. Reports more fatigue, but is still independent in ADLs. She has had no issues or concerns since her last visit. Her weight is stable. Besides pain secondary to neuralgia from shingles, she has no abnormal discomfort/pain. She has had no change in bowel function.She is still living independently and able to function at a high level without any decline in her energy or performance status. She is completely asymptomatic. Reports grade 1-2 fatigue. She has intermittent nausea which is controlled with Compazine, but the Compazine is making her more tired. She has no vomiting whatsoever.    Medications: I have reviewed the patient's current medications.  Current Outpatient Prescriptions  Medication Sig Dispense Refill  . acetaminophen (TYLENOL) 500 MG tablet Take 1,000 mg by mouth every 6 (six) hours as needed for pain.      Marland Kitchen allopurinol (ZYLOPRIM) 150 mg TABS Take 150 mg by  mouth every morning.       Marland Kitchen amLODipine (NORVASC) 10 MG tablet Take 10 mg by mouth every morning.      . baclofen (LIORESAL) 10 MG tablet Take 10 mg by mouth 2 (two) times daily.      . Biotin 5000 MCG CAPS Take 5,000 mcg by mouth every morning.       . Coenzyme Q10 (COQ-10) 200 MG CAPS Take 200 mg by mouth at bedtime.       . furosemide (LASIX) 20 MG tablet Take 20 mg by mouth every morning.       Marland Kitchen levothyroxine (SYNTHROID, LEVOTHROID) 75 MCG tablet Take 75 mcg by mouth every morning.       . lidocaine-prilocaine (EMLA) cream Apply topically as needed. Apply to port before chemotherapy  30 g  0  . ondansetron (ZOFRAN) 8 MG tablet Take 1 tablet (8 mg total) by mouth every 8 (eight) hours as needed for nausea.  20 tablet  2  . oxyCODONE-acetaminophen (ROXICET) 5-325 MG per tablet Take 1-2 tablets by mouth every 4 (four) hours as needed for pain.  30 tablet  0  . pravastatin (PRAVACHOL) 20 MG tablet Take 20 mg by mouth at bedtime.       . Prenat w/o A-FeCbGl-DSS-FA-DHA (CITRANATAL DHA PO) Take 1 tablet by mouth every morning.      . prochlorperazine (COMPAZINE) 10 MG tablet Take 1 tablet (10 mg total) by mouth every 6 (six) hours as needed.  30 tablet  2  . valsartan (DIOVAN) 160 MG tablet Take 160 mg by mouth every morning.  No current facility-administered medications for this visit.     Allergies:  Allergies  Allergen Reactions  . Shellfish Allergy Itching and Rash    Feels itching internally.  . Meperidine Hcl     REACTION: Hypotension  . Rosuvastatin Other (See Comments)    "Makes my muscles ache and weak in the legs"  . Tramadol Hcl     REACTION: Tongue and lips swell    Past Medical History, Surgical history, Social history, and Family History were reviewed and updated.  Review of Systems: Constitutional:  Negative for fever, chills, night sweats, anorexia, weight loss, pain. Cardiovascular: no chest pain or dyspnea on exertion Respiratory: negative Neurological:  negative Dermatological: negative ENT: negative Skin: Negative. Gastrointestinal: negative Genito-Urinary: negative Hematological and Lymphatic: negative Breast: negative Musculoskeletal: negative Remaining ROS negative.  Physical Exam: Blood pressure 138/71, pulse 75, temperature 98.3 F (36.8 C), temperature source Oral, resp. rate 18, height 5\' 4"  (1.626 m), weight 187 lb 3.2 oz (84.913 kg), SpO2 99.00%. ECOG: 0 General appearance: alert Head: Normocephalic, without obvious abnormality, atraumatic Neck: no adenopathy, no carotid bruit, no JVD, supple, symmetrical, trachea midline and thyroid not enlarged, symmetric, no tenderness/mass/nodules Lymph nodes: Cervical, supraclavicular, and axillary nodes normal. Heart:regular rate and rhythm, S1, S2 normal, no murmur, click, rub or gallop Lung:chest clear, no wheezing, rales, normal symmetric air entry Abdomen: soft, non-tender, without masses or organomegaly EXT:no erythema, induration, or nodules   Lab Results: Lab Results  Component Value Date   WBC 10.5* 10/22/2012   HGB 8.3* 10/22/2012   HCT 24.7* 10/22/2012   MCV 89.0 10/22/2012   PLT 346 10/22/2012     Chemistry      Component Value Date/Time   NA 137 10/22/2012 0823   NA 138 09/18/2012 1355   K 4.2 10/22/2012 0823   K 5.5* 09/18/2012 1355   CL 102 09/25/2012 0933   CL 103 09/18/2012 1355   CO2 17* 10/22/2012 0823   CO2 24 09/18/2012 1355   BUN 26.4* 10/22/2012 0823   BUN 38* 09/18/2012 1355   CREATININE 2.1* 10/22/2012 0823   CREATININE 2.23* 09/18/2012 1355      Component Value Date/Time   CALCIUM 8.6 10/22/2012 0823   CALCIUM 9.7 09/18/2012 1355   ALKPHOS 77 10/22/2012 0823   ALKPHOS 52 08/30/2011 1107   AST 24 10/22/2012 0823   AST 21 08/30/2011 1107   ALT 15 10/22/2012 0823   ALT 18 08/30/2011 1107   BILITOT 0.20 10/22/2012 0823   BILITOT 0.3 08/30/2011 1107       Impression and Plan:  Ashley Davenport is a 79 year old woman with:  1. T3 N0 moderately differentiated  adenocarcinoma of the rectum with evidence of LV invasion and positive surgical margins 05/30/2006.  She is status post neoadjuvant continuous infusion 5-FU with external radiation therapy between July 10, 2006 and Sep 01, 2006.  Now has stage IV disease and S/P 2 cycles of FOLFIRI with Avastin added with cycle 2. Recommend that she proceed with cycle 3 of her chemotherapy on 10/24/2002 2 without dose modification. She will get 4 cycles and repeat CT scan after that.   2. Anemia: stable at this point, likely to kidney disease and chemotherapy.    3. Neutropenia: Receives Neulasta with each cycle of chemotherapy.  4. nausea and vomiting prophylaxis: I have refilled her Compazine today. I have also given her a prescription for Zofran to use as needed.   5. Follow up: in two weeks.      CURCIO,  KRISTIN 7/21/20141:02 PM

## 2012-10-23 ENCOUNTER — Ambulatory Visit (HOSPITAL_BASED_OUTPATIENT_CLINIC_OR_DEPARTMENT_OTHER): Payer: Medicare Other

## 2012-10-23 VITALS — BP 135/83 | HR 76 | Temp 97.1°F | Resp 16

## 2012-10-23 DIAGNOSIS — Z452 Encounter for adjustment and management of vascular access device: Secondary | ICD-10-CM

## 2012-10-23 DIAGNOSIS — C20 Malignant neoplasm of rectum: Secondary | ICD-10-CM

## 2012-10-23 DIAGNOSIS — Z5112 Encounter for antineoplastic immunotherapy: Secondary | ICD-10-CM

## 2012-10-23 DIAGNOSIS — C787 Secondary malignant neoplasm of liver and intrahepatic bile duct: Secondary | ICD-10-CM

## 2012-10-23 DIAGNOSIS — Z5111 Encounter for antineoplastic chemotherapy: Secondary | ICD-10-CM

## 2012-10-23 MED ORDER — ALTEPLASE 2 MG IJ SOLR
2.0000 mg | Freq: Once | INTRAMUSCULAR | Status: AC | PRN
  Administered 2012-10-23: 2 mg
  Filled 2012-10-23: qty 2

## 2012-10-23 MED ORDER — SODIUM CHLORIDE 0.9 % IV SOLN
5.0000 mg/kg | Freq: Once | INTRAVENOUS | Status: AC
  Administered 2012-10-23: 425 mg via INTRAVENOUS
  Filled 2012-10-23: qty 17

## 2012-10-23 MED ORDER — SODIUM CHLORIDE 0.9 % IV SOLN
Freq: Once | INTRAVENOUS | Status: AC
  Administered 2012-10-23: 10:00:00 via INTRAVENOUS

## 2012-10-23 MED ORDER — ATROPINE SULFATE 1 MG/ML IJ SOLN
0.5000 mg | Freq: Once | INTRAMUSCULAR | Status: AC | PRN
  Administered 2012-10-23: 0.5 mg via INTRAVENOUS

## 2012-10-23 MED ORDER — FLUOROURACIL CHEMO INJECTION 2.5 GM/50ML
400.0000 mg/m2 | Freq: Once | INTRAVENOUS | Status: AC
  Administered 2012-10-23: 800 mg via INTRAVENOUS
  Filled 2012-10-23: qty 16

## 2012-10-23 MED ORDER — SODIUM CHLORIDE 0.9 % IV SOLN
2400.0000 mg/m2 | INTRAVENOUS | Status: DC
  Administered 2012-10-23: 4750 mg via INTRAVENOUS
  Filled 2012-10-23: qty 95

## 2012-10-23 MED ORDER — DEXAMETHASONE SODIUM PHOSPHATE 20 MG/5ML IJ SOLN
20.0000 mg | Freq: Once | INTRAMUSCULAR | Status: AC
  Administered 2012-10-23: 20 mg via INTRAVENOUS

## 2012-10-23 MED ORDER — LEUCOVORIN CALCIUM INJECTION 350 MG
400.0000 mg/m2 | Freq: Once | INTRAVENOUS | Status: AC
  Administered 2012-10-23: 792 mg via INTRAVENOUS
  Filled 2012-10-23: qty 39.6

## 2012-10-23 MED ORDER — ONDANSETRON 16 MG/50ML IVPB (CHCC)
16.0000 mg | Freq: Once | INTRAVENOUS | Status: AC
  Administered 2012-10-23: 16 mg via INTRAVENOUS

## 2012-10-23 MED ORDER — IRINOTECAN HCL CHEMO INJECTION 100 MG/5ML
180.0000 mg/m2 | Freq: Once | INTRAVENOUS | Status: AC
  Administered 2012-10-23: 356 mg via INTRAVENOUS
  Filled 2012-10-23: qty 17.8

## 2012-10-23 NOTE — Patient Instructions (Addendum)
[facility] Discharge Instructions for Patients Receiving Chemotherapy  Today you received the following chemotherapy agents: Avastin, 5-FU, Leucovorin, Irinotecan.   To help prevent nausea and vomiting after your treatment, we encourage you to take your nausea medication as directed by your physician.   If you develop nausea and vomiting that is not controlled by your nausea medication, call the clinic.   BELOW ARE SYMPTOMS THAT SHOULD BE REPORTED IMMEDIATELY:  *FEVER GREATER THAN 100.5 F  *CHILLS WITH OR WITHOUT FEVER  NAUSEA AND VOMITING THAT IS NOT CONTROLLED WITH YOUR NAUSEA MEDICATION  *UNUSUAL SHORTNESS OF BREATH  *UNUSUAL BRUISING OR BLEEDING  TENDERNESS IN MOUTH AND THROAT WITH OR WITHOUT PRESENCE OF ULCERS  *URINARY PROBLEMS  *BOWEL PROBLEMS  UNUSUAL RASH Items with * indicate a potential emergency and should be followed up as soon as possible.  Feel free to call the clinic you have any questions or concerns. The clinic phone number is [phone].

## 2012-10-23 NOTE — Progress Notes (Signed)
0900 no blood return noted. TPA administered at 0907.  0932 positive blood return noted. 10 cc of blood drawn and wasted.

## 2012-10-25 ENCOUNTER — Ambulatory Visit: Payer: Medicare Other

## 2012-10-25 ENCOUNTER — Ambulatory Visit (HOSPITAL_BASED_OUTPATIENT_CLINIC_OR_DEPARTMENT_OTHER): Payer: Medicare Other

## 2012-10-25 VITALS — BP 116/76 | HR 70 | Temp 97.6°F

## 2012-10-25 DIAGNOSIS — N189 Chronic kidney disease, unspecified: Secondary | ICD-10-CM

## 2012-10-25 DIAGNOSIS — C787 Secondary malignant neoplasm of liver and intrahepatic bile duct: Secondary | ICD-10-CM

## 2012-10-25 DIAGNOSIS — C20 Malignant neoplasm of rectum: Secondary | ICD-10-CM

## 2012-10-25 DIAGNOSIS — D631 Anemia in chronic kidney disease: Secondary | ICD-10-CM

## 2012-10-25 DIAGNOSIS — Z5189 Encounter for other specified aftercare: Secondary | ICD-10-CM

## 2012-10-25 MED ORDER — HEPARIN SOD (PORK) LOCK FLUSH 100 UNIT/ML IV SOLN
500.0000 [IU] | Freq: Once | INTRAVENOUS | Status: AC | PRN
  Administered 2012-10-25: 500 [IU]
  Filled 2012-10-25: qty 5

## 2012-10-25 MED ORDER — SODIUM CHLORIDE 0.9 % IJ SOLN
10.0000 mL | INTRAMUSCULAR | Status: DC | PRN
  Administered 2012-10-25: 10 mL
  Filled 2012-10-25: qty 10

## 2012-10-25 MED ORDER — DARBEPOETIN ALFA-POLYSORBATE 500 MCG/ML IJ SOLN
300.0000 ug | Freq: Once | INTRAMUSCULAR | Status: AC
  Administered 2012-10-25: 300 ug via SUBCUTANEOUS
  Filled 2012-10-25: qty 1

## 2012-10-25 MED ORDER — PEGFILGRASTIM INJECTION 6 MG/0.6ML
6.0000 mg | Freq: Once | SUBCUTANEOUS | Status: AC
  Administered 2012-10-25: 6 mg via SUBCUTANEOUS
  Filled 2012-10-25: qty 0.6

## 2012-10-30 ENCOUNTER — Ambulatory Visit: Payer: Medicare Other | Admitting: Vascular Surgery

## 2012-11-02 ENCOUNTER — Telehealth: Payer: Self-pay | Admitting: *Deleted

## 2012-11-02 NOTE — Telephone Encounter (Signed)
Patient calling in to report that she has had 3 diarrhea since yesterday. Patient had chemo 10/23/12 and was given Atropine as premeds with Camptosar/5FU/LV. Informed patient that this is not likely to be chemo related on day 11, but hydration status is very important throughout her chemo plan. She has Imodium and has taken 3 tabs since yesterday. Denies any fever, chills, she states she is still continuing to eat and drink well despite the watery stools. Encouraged patient to take 2 Imodium now, and 2 if any additional stools. If patient maximizes 8 tabs today, she should call back for further instructions from our oncall MD. Patient does live alone, but does have family checking in on her. Informed patient that if this continues, she may be directed to the ER for further evaluation of dehydration. Patient verbalized understanding.

## 2012-11-06 ENCOUNTER — Telehealth: Payer: Self-pay | Admitting: Oncology

## 2012-11-06 ENCOUNTER — Ambulatory Visit (HOSPITAL_BASED_OUTPATIENT_CLINIC_OR_DEPARTMENT_OTHER): Payer: Medicare Other | Admitting: Oncology

## 2012-11-06 ENCOUNTER — Ambulatory Visit (HOSPITAL_BASED_OUTPATIENT_CLINIC_OR_DEPARTMENT_OTHER): Payer: Medicare Other

## 2012-11-06 ENCOUNTER — Other Ambulatory Visit (HOSPITAL_BASED_OUTPATIENT_CLINIC_OR_DEPARTMENT_OTHER): Payer: Medicare Other | Admitting: Lab

## 2012-11-06 VITALS — BP 124/59 | HR 65 | Resp 18

## 2012-11-06 VITALS — BP 94/59 | HR 68 | Temp 97.9°F | Resp 20 | Ht 64.0 in | Wt 178.9 lb

## 2012-11-06 DIAGNOSIS — C787 Secondary malignant neoplasm of liver and intrahepatic bile duct: Secondary | ICD-10-CM

## 2012-11-06 DIAGNOSIS — N189 Chronic kidney disease, unspecified: Secondary | ICD-10-CM

## 2012-11-06 DIAGNOSIS — D631 Anemia in chronic kidney disease: Secondary | ICD-10-CM

## 2012-11-06 DIAGNOSIS — R5381 Other malaise: Secondary | ICD-10-CM

## 2012-11-06 DIAGNOSIS — D509 Iron deficiency anemia, unspecified: Secondary | ICD-10-CM

## 2012-11-06 DIAGNOSIS — C20 Malignant neoplasm of rectum: Secondary | ICD-10-CM

## 2012-11-06 DIAGNOSIS — B029 Zoster without complications: Secondary | ICD-10-CM

## 2012-11-06 DIAGNOSIS — R52 Pain, unspecified: Secondary | ICD-10-CM

## 2012-11-06 LAB — COMPREHENSIVE METABOLIC PANEL (CC13)
AST: 20 U/L (ref 5–34)
Alkaline Phosphatase: 86 U/L (ref 40–150)
BUN: 51.5 mg/dL — ABNORMAL HIGH (ref 7.0–26.0)
Creatinine: 4.3 mg/dL (ref 0.6–1.1)
Potassium: 4.1 mEq/L (ref 3.5–5.1)

## 2012-11-06 LAB — CBC WITH DIFFERENTIAL/PLATELET
Basophils Absolute: 0.2 10*3/uL — ABNORMAL HIGH (ref 0.0–0.1)
EOS%: 1.6 % (ref 0.0–7.0)
Eosinophils Absolute: 0.3 10*3/uL (ref 0.0–0.5)
HGB: 8.3 g/dL — ABNORMAL LOW (ref 11.6–15.9)
MCH: 28.2 pg (ref 25.1–34.0)
NEUT#: 16.3 10*3/uL — ABNORMAL HIGH (ref 1.5–6.5)
RBC: 2.94 10*6/uL — ABNORMAL LOW (ref 3.70–5.45)
RDW: 17.2 % — ABNORMAL HIGH (ref 11.2–14.5)
lymph#: 0.8 10*3/uL — ABNORMAL LOW (ref 0.9–3.3)
nRBC: 2 % — ABNORMAL HIGH (ref 0–0)

## 2012-11-06 LAB — IRON AND TIBC CHCC
%SAT: UNDETERMINED % (ref 21–57)
Iron: 1248 ug/dL — ABNORMAL HIGH (ref 41–142)
UIBC: UNDETERMINED ug/dL (ref 120–384)

## 2012-11-06 LAB — FERRITIN CHCC: Ferritin: 252 ng/ml (ref 9–269)

## 2012-11-06 MED ORDER — SODIUM CHLORIDE 0.9 % IV SOLN: Freq: Once | INTRAVENOUS | Status: AC

## 2012-11-06 MED ORDER — FERUMOXYTOL INJECTION 510 MG/17 ML
1020.0000 mg | Freq: Once | INTRAVENOUS | Status: AC
  Administered 2012-11-06: 1020 mg via INTRAVENOUS
  Filled 2012-11-06: qty 34

## 2012-11-06 MED ORDER — HEPARIN SOD (PORK) LOCK FLUSH 100 UNIT/ML IV SOLN
500.0000 [IU] | Freq: Once | INTRAVENOUS | Status: AC | PRN
  Administered 2012-11-06: 500 [IU]
  Filled 2012-11-06: qty 5

## 2012-11-06 MED ORDER — SODIUM CHLORIDE 0.9 % IV SOLN
INTRAVENOUS | Status: DC
  Administered 2012-11-06: 11:00:00 via INTRAVENOUS

## 2012-11-06 MED ORDER — SODIUM CHLORIDE 0.9 % IJ SOLN
10.0000 mL | INTRAMUSCULAR | Status: DC | PRN
  Administered 2012-11-06: 10 mL
  Filled 2012-11-06: qty 10

## 2012-11-06 NOTE — Addendum Note (Signed)
Addended by: Reesa Chew on: 11/06/2012 11:03 AM   Modules accepted: Orders

## 2012-11-06 NOTE — Progress Notes (Signed)
Hematology and Oncology Follow Up Visit  Ashley Davenport [id number] 02-Sep-1930 79 y.o. 11/06/2012 9:31 AM [name], MDClark, Lindell Spar, MD   Principle Diagnosis: 79 year old woman with rectal cancer and anemia of chronic disease secondary to kidney disease. She was diagnosed with rectal cancer on  09/01/2006.  She hadT3 N0 moderately differentiated adenocarcinoma of the rectum with evidence of LV invasion. Now she has stage IV disease with liver mets.    Prior Therapy: She is status post neoadjuvant continuous infusion 5-FU with external radiation therapy between July 10, 2006 and Sep 01, 2006.  She declined abdominoperineal resection. She went on to receive additional 3 cycles of Xeloda, but developed hand-foot syndrome and declined further therapy.   Current therapy: She is on FOLFIRI + Avastin. First cycle given on 6/27 with FOLFIRI alone. She is due for cycle 4 of her chemotherapy on 11/06/2012.  Interim History: Ashley Davenport presents today for a follow up visit. She is a very nice women with the above diagnosis. Reports more fatigue, but is still independent in ADLs. Her food intake has been poor and have lost weight as well. Besides pain secondary to neuralgia from shingles, she has no abnormal discomfort/pain. She has reported grade 2 diarrhea and using lomotil with some help. She reports grade 2 fatigue. She has intermittent nausea which is controlled with antiemetics. Overall, she has done poorly since the last visit. She is reporting more ice craving.     Medications: I have reviewed the patient's current medications.  Current Outpatient Prescriptions  Medication Sig Dispense Refill  . acetaminophen (TYLENOL) 500 MG tablet Take 1,000 mg by mouth every 6 (six) hours as needed for pain.      Marland Kitchen allopurinol (ZYLOPRIM) 150 mg TABS Take 150 mg by mouth every morning.       Marland Kitchen amLODipine (NORVASC) 10 MG tablet Take 10 mg by mouth every morning.      . baclofen (LIORESAL) 10 MG  tablet Take 10 mg by mouth 2 (two) times daily.      . Biotin 5000 MCG CAPS Take 5,000 mcg by mouth every morning.       . Coenzyme Q10 (COQ-10) 200 MG CAPS Take 200 mg by mouth at bedtime.       . furosemide (LASIX) 20 MG tablet Take 20 mg by mouth every morning.       Marland Kitchen levothyroxine (SYNTHROID, LEVOTHROID) 75 MCG tablet Take 75 mcg by mouth every morning.       . lidocaine-prilocaine (EMLA) cream Apply topically as needed. Apply to port before chemotherapy  30 g  0  . ondansetron (ZOFRAN) 8 MG tablet Take 1 tablet (8 mg total) by mouth every 8 (eight) hours as needed for nausea.  20 tablet  2  . oxyCODONE-acetaminophen (ROXICET) 5-325 MG per tablet Take 1-2 tablets by mouth every 4 (four) hours as needed for pain.  30 tablet  0  . pravastatin (PRAVACHOL) 20 MG tablet Take 20 mg by mouth at bedtime.       . Prenat w/o A-FeCbGl-DSS-FA-DHA (CITRANATAL DHA PO) Take 1 tablet by mouth every morning.      . prochlorperazine (COMPAZINE) 10 MG tablet Take 1 tablet (10 mg total) by mouth every 6 (six) hours as needed.  30 tablet  2  . valsartan (DIOVAN) 160 MG tablet Take 160 mg by mouth every morning.        No current facility-administered medications for this visit.     Allergies:  Allergies  Allergen Reactions  . Shellfish Allergy Itching and Rash    Feels itching internally.  . Meperidine Hcl     REACTION: Hypotension  . Rosuvastatin Other (See Comments)    "Makes my muscles ache and weak in the legs"  . Tramadol Hcl     REACTION: Tongue and lips swell    Past Medical History, Surgical history, Social history, and Family History were reviewed and updated.  Review of Systems: Constitutional:  Negative for fever, chills, night sweats, anorexia, weight loss, pain. Cardiovascular: no chest pain or dyspnea on exertion Respiratory: negative Neurological: negative Dermatological: negative ENT: negative Skin: Negative. Gastrointestinal: negative Genito-Urinary: negative Hematological  and Lymphatic: negative Breast: negative Musculoskeletal: negative Remaining ROS negative.  Physical Exam: Blood pressure 94/59, pulse 68, temperature 97.9 F (36.6 C), temperature source Oral, resp. rate 20, height 5\' 4"  (1.626 m), weight 178 lb 14.4 oz (81.149 kg). ECOG: 1 General appearance: alert Head: Normocephalic, without obvious abnormality, atraumatic Neck: no adenopathy, no carotid bruit, no JVD, supple, symmetrical, trachea midline and thyroid not enlarged, symmetric, no tenderness/mass/nodules Lymph nodes: Cervical, supraclavicular, and axillary nodes normal. Heart:regular rate and rhythm, S1, S2 normal, no murmur, click, rub or gallop Lung:chest clear, no wheezing, rales, normal symmetric air entry Abdomen: soft, non-tender, without masses or organomegaly EXT:no erythema, induration, or nodules   Lab Results: Lab Results  Component Value Date   WBC 19.6* 11/06/2012   HGB 8.3* 11/06/2012   HCT 25.7* 11/06/2012   MCV 87.4 11/06/2012   PLT 400 11/06/2012     Chemistry      Component Value Date/Time   NA 137 10/22/2012 0823   NA 138 09/18/2012 1355   K 4.2 10/22/2012 0823   K 5.5* 09/18/2012 1355   CL 102 09/25/2012 0933   CL 103 09/18/2012 1355   CO2 17* 10/22/2012 0823   CO2 24 09/18/2012 1355   BUN 26.4* 10/22/2012 0823   BUN 38* 09/18/2012 1355   CREATININE 2.1* 10/22/2012 0823   CREATININE 2.23* 09/18/2012 1355      Component Value Date/Time   CALCIUM 8.6 10/22/2012 0823   CALCIUM 9.7 09/18/2012 1355   ALKPHOS 77 10/22/2012 0823   ALKPHOS 52 08/30/2011 1107   AST 24 10/22/2012 0823   AST 21 08/30/2011 1107   ALT 15 10/22/2012 0823   ALT 18 08/30/2011 1107   BILITOT 0.20 10/22/2012 0823   BILITOT 0.3 08/30/2011 1107       Impression and Plan:  Ashley Davenport is a 79 year old woman with:  1. T3 N0 moderately differentiated adenocarcinoma of the rectum with evidence of LV invasion and positive surgical margins 05/30/2006.  She is status post neoadjuvant continuous infusion  5-FU with external radiation therapy between July 10, 2006 and Sep 01, 2006.  Now has stage IV disease and S/P 2 cycles of FOLFIRI with Avastin added with cycle 2.  She has developed grade 2 top 3 diarrhea, fatigue and weight loss.  I will hold cycle 4 today and give her IV fluids instead. We will resume cycle 4 in 2 weeks with CPT-11 dose reduction (125 mg/m2).  I will repeat CT scan before the next chemotherapy cycle.   2. Anemia: I will check iron stores and give her IV Fe today.   3. Neutropenia: Receives Neulasta with each cycle of chemotherapy.  4. nausea and vomiting prophylaxis: On Zofran and compazine.   5. Follow up: in two weeks for cycle 4.      Tenishia Ekman 8/5/20149:31 AM

## 2012-11-06 NOTE — Patient Instructions (Signed)
Ferumoxytol injection What is this medicine? FERUMOXYTOL is an iron complex. Iron is used to make healthy red blood cells, which carry oxygen and nutrients throughout the body. This medicine is used to treat iron deficiency anemia in people with chronic kidney disease. This medicine may be used for other purposes; ask your health care provider or pharmacist if you have questions. What should I tell my health care provider before I take this medicine? They need to know if you have any of these conditions: -anemia not caused by low iron levels -high levels of iron in the blood -magnetic resonance imaging (MRI) test scheduled -an unusual or allergic reaction to iron, other medicines, foods, dyes, or preservatives -pregnant or trying to get pregnant -breast-feeding How should I use this medicine? This medicine is for infusion into a vein. It is given by a health care professional in a hospital or clinic setting. Talk to your pediatrician regarding the use of this medicine in children. Special care may be needed. Overdosage: If you think you've taken too much of this medicine contact a poison control center or emergency room at once. Overdosage: If you think you have taken too much of this medicine contact a poison control center or emergency room at once. NOTE: This medicine is only for you. Do not share this medicine with others. What if I miss a dose? It is important not to miss your dose. Call your doctor or health care professional if you are unable to keep an appointment. What may interact with this medicine? This medicine may interact with the following medications: -other iron products This list may not describe all possible interactions. Give your health care provider a list of all the medicines, herbs, non-prescription drugs, or dietary supplements you use. Also tell them if you smoke, drink alcohol, or use illegal drugs. Some items may interact with your medicine. What should I watch  for while using this medicine? Visit your doctor or healthcare professional regularly. Tell your doctor or healthcare professional if your symptoms do not start to get better or if they get worse. You may need blood work done while you are taking this medicine. You may need to follow a special diet. Talk to your doctor. Foods that contain iron include: whole grains/cereals, dried fruits, beans, or peas, leafy green vegetables, and organ meats (liver, kidney). What side effects may I notice from receiving this medicine? Side effects that you should report to your doctor or health care professional as soon as possible: -allergic reactions like skin rash, itching or hives, swelling of the face, lips, or tongue -breathing problems -changes in blood pressure -feeling faint or lightheaded, falls -fever or chills -flushing, sweating, or hot feelings -swelling of the ankles or feet Side effects that usually do not require medical attention (Report these to your doctor or health care professional if they continue or are bothersome.): -diarrhea -headache -nausea, vomiting -stomach pain This list may not describe all possible side effects. Call your doctor for medical advice about side effects. You may report side effects to FDA at 1-800-FDA-1088. Where should I keep my medicine? This drug is given in a hospital or clinic and will not be stored at home. NOTE: This sheet is a summary. It may not cover all possible information. If you have questions about this medicine, talk to your doctor, pharmacist, or health care provider.  2013, [employer]. (12/12/2007 9:48:25 PM)  

## 2012-11-06 NOTE — Telephone Encounter (Signed)
gv and printed appt sched and avs for pt...emailed MB to add tx.   °

## 2012-11-08 ENCOUNTER — Ambulatory Visit: Payer: Medicare Other

## 2012-11-12 ENCOUNTER — Telehealth: Payer: Self-pay | Admitting: *Deleted

## 2012-11-12 NOTE — Telephone Encounter (Signed)
Per staff phone call and POF I have schedueld appts.  JMW  

## 2012-11-13 ENCOUNTER — Ambulatory Visit (HOSPITAL_COMMUNITY)
Admission: RE | Admit: 2012-11-13 | Discharge: 2012-11-13 | Disposition: A | Discharge status: Home / Self Care | Payer: Medicare Other | Source: Ambulatory Visit | Attending: Oncology | Admitting: Oncology

## 2012-11-13 ENCOUNTER — Encounter (HOSPITAL_COMMUNITY): Payer: Self-pay

## 2012-11-13 DIAGNOSIS — C20 Malignant neoplasm of rectum: Secondary | ICD-10-CM | POA: Insufficient documentation

## 2012-11-13 DIAGNOSIS — N281 Cyst of kidney, acquired: Secondary | ICD-10-CM | POA: Insufficient documentation

## 2012-11-13 DIAGNOSIS — C787 Secondary malignant neoplasm of liver and intrahepatic bile duct: Secondary | ICD-10-CM | POA: Insufficient documentation

## 2012-11-13 DIAGNOSIS — R918 Other nonspecific abnormal finding of lung field: Secondary | ICD-10-CM | POA: Insufficient documentation

## 2012-11-13 DIAGNOSIS — D631 Anemia in chronic kidney disease: Secondary | ICD-10-CM

## 2012-11-13 DIAGNOSIS — Z79899 Other long term (current) drug therapy: Secondary | ICD-10-CM | POA: Insufficient documentation

## 2012-11-13 DIAGNOSIS — K7689 Other specified diseases of liver: Secondary | ICD-10-CM | POA: Insufficient documentation

## 2012-11-13 DIAGNOSIS — K5730 Diverticulosis of large intestine without perforation or abscess without bleeding: Secondary | ICD-10-CM | POA: Insufficient documentation

## 2012-11-13 DIAGNOSIS — I700 Atherosclerosis of aorta: Secondary | ICD-10-CM | POA: Insufficient documentation

## 2012-11-20 ENCOUNTER — Ambulatory Visit (HOSPITAL_BASED_OUTPATIENT_CLINIC_OR_DEPARTMENT_OTHER): Payer: Medicare Other

## 2012-11-20 ENCOUNTER — Other Ambulatory Visit (HOSPITAL_BASED_OUTPATIENT_CLINIC_OR_DEPARTMENT_OTHER): Payer: Medicare Other | Admitting: Lab

## 2012-11-20 ENCOUNTER — Encounter: Payer: Self-pay | Admitting: Oncology

## 2012-11-20 ENCOUNTER — Telehealth: Payer: Self-pay | Admitting: *Deleted

## 2012-11-20 ENCOUNTER — Ambulatory Visit (HOSPITAL_BASED_OUTPATIENT_CLINIC_OR_DEPARTMENT_OTHER): Payer: Medicare Other | Admitting: Oncology

## 2012-11-20 VITALS — BP 123/76 | HR 93 | Temp 98.1°F | Resp 18 | Ht 64.0 in | Wt 177.2 lb

## 2012-11-20 DIAGNOSIS — D631 Anemia in chronic kidney disease: Secondary | ICD-10-CM

## 2012-11-20 DIAGNOSIS — D709 Neutropenia, unspecified: Secondary | ICD-10-CM

## 2012-11-20 DIAGNOSIS — C787 Secondary malignant neoplasm of liver and intrahepatic bile duct: Secondary | ICD-10-CM

## 2012-11-20 DIAGNOSIS — I10 Essential (primary) hypertension: Secondary | ICD-10-CM

## 2012-11-20 DIAGNOSIS — Z452 Encounter for adjustment and management of vascular access device: Secondary | ICD-10-CM

## 2012-11-20 DIAGNOSIS — C20 Malignant neoplasm of rectum: Secondary | ICD-10-CM

## 2012-11-20 DIAGNOSIS — D649 Anemia, unspecified: Secondary | ICD-10-CM

## 2012-11-20 LAB — COMPREHENSIVE METABOLIC PANEL (CC13)
ALT: 11 U/L (ref 0–55)
AST: 17 U/L (ref 5–34)
Alkaline Phosphatase: 60 U/L (ref 40–150)
CO2: 19 mEq/L — ABNORMAL LOW (ref 22–29)
Creatinine: 1.9 mg/dL — ABNORMAL HIGH (ref 0.6–1.1)
Sodium: 140 mEq/L (ref 136–145)
Total Bilirubin: 0.33 mg/dL (ref 0.20–1.20)
Total Protein: 6.9 g/dL (ref 6.4–8.3)

## 2012-11-20 LAB — CBC WITH DIFFERENTIAL/PLATELET
Basophils Absolute: 0.1 10*3/uL (ref 0.0–0.1)
EOS%: 1 % (ref 0.0–7.0)
HCT: 30 % — ABNORMAL LOW (ref 34.8–46.6)
HGB: 9.9 g/dL — ABNORMAL LOW (ref 11.6–15.9)
LYMPH%: 7.6 % — ABNORMAL LOW (ref 14.0–49.7)
MCH: 30 pg (ref 25.1–34.0)
MCV: 90.7 fL (ref 79.5–101.0)
MONO%: 11.3 % (ref 0.0–14.0)
NEUT%: 79 % — ABNORMAL HIGH (ref 38.4–76.8)

## 2012-11-20 MED ORDER — ALTEPLASE 2 MG IJ SOLR
2.0000 mg | Freq: Once | INTRAMUSCULAR | Status: AC | PRN
  Administered 2012-11-20: 2 mg
  Filled 2012-11-20: qty 2

## 2012-11-20 NOTE — Progress Notes (Addendum)
Hematology and Oncology Follow Up Visit  Ashley Davenport [id number] 1930-10-17 79 y.o. 11/20/2012 10:20 AM Ashley Davenport, MDClark, Lindell Spar, MD   Principle Diagnosis: 79 year old woman with rectal cancer and anemia of chronic disease secondary to kidney disease. She was diagnosed with rectal cancer on  09/01/2006.  She hadT3 N0 moderately differentiated adenocarcinoma of the rectum with evidence of LV invasion. Now she has stage IV disease with liver mets.    Prior Therapy: She is status post neoadjuvant continuous infusion 5-FU with external radiation therapy between July 10, 2006 and Sep 01, 2006.  She declined abdominoperineal resection. She went on to receive additional 3 cycles of Xeloda, but developed hand-foot syndrome and declined further therapy.   Current therapy: She is on FOLFIRI + Avastin. First cycle given on 6/27 with FOLFIRI alone. Cycle 4 was delayed due to grade 2 fatigue and grade 2-3 diarrhea. She is here to resume cycle 4 of chemo with dose reduction of her Irinotecan to 125 mg/m2.  Interim History: Ashley Davenport presents today for a follow up visit. She is a very nice women with the above diagnosis. Fatigue is better today. Remains independent in ADLs. Appetite is improving and weight is stable. Besides pain secondary to neuralgia from shingles, she has no abnormal discomfort/pain. Diarrhea is better. No nausea or vomiting. No chest pain, shortness of breath, or dyspnea. States that she checks BP at home and hold BP meds if her BP is running low. She received IV Feraheme for iron deficiency anemia and tolerated this well without infusion reaction.   Medications: I have reviewed the patient's current medications.  Current Outpatient Prescriptions  Medication Sig Dispense Refill  . acetaminophen (TYLENOL) 500 MG tablet Take 1,000 mg by mouth every 6 (six) hours as needed for pain.      Marland Kitchen allopurinol (ZYLOPRIM) 150 mg TABS Take 150 mg by mouth every morning.       Marland Kitchen  amLODipine (NORVASC) 10 MG tablet Take 10 mg by mouth every morning.      . baclofen (LIORESAL) 10 MG tablet Take 10 mg by mouth 2 (two) times daily.      . Biotin 5000 MCG CAPS Take 5,000 mcg by mouth every morning.       . Coenzyme Q10 (COQ-10) 200 MG CAPS Take 200 mg by mouth at bedtime.       . furosemide (LASIX) 20 MG tablet Take 20 mg by mouth every morning.       Marland Kitchen levothyroxine (SYNTHROID, LEVOTHROID) 75 MCG tablet Take 75 mcg by mouth every morning.       . lidocaine-prilocaine (EMLA) cream Apply topically as needed. Apply to port before chemotherapy  30 g  0  . ondansetron (ZOFRAN) 8 MG tablet Take 1 tablet (8 mg total) by mouth every 8 (eight) hours as needed for nausea.  20 tablet  2  . oxyCODONE-acetaminophen (ROXICET) 5-325 MG per tablet Take 1-2 tablets by mouth every 4 (four) hours as needed for pain.  30 tablet  0  . pravastatin (PRAVACHOL) 20 MG tablet Take 20 mg by mouth at bedtime.       . Prenat w/o A-FeCbGl-DSS-FA-DHA (CITRANATAL DHA PO) Take 1 tablet by mouth every morning.      . prochlorperazine (COMPAZINE) 10 MG tablet Take 1 tablet (10 mg total) by mouth every 6 (six) hours as needed.  30 tablet  2  . valsartan (DIOVAN) 160 MG tablet Take 160 mg by mouth every morning.  No current facility-administered medications for this visit.   Facility-Administered Medications Ordered in Other Visits  Medication Dose Route Frequency Provider Last Rate Last Dose  . 0.9 %  sodium chloride infusion   Intravenous Once Benjiman Core, MD         Allergies:  Allergies  Allergen Reactions  . Shellfish Allergy Itching and Rash    Feels itching internally.  . Meperidine Hcl     REACTION: Hypotension  . Rosuvastatin Other (See Comments)    "Makes my muscles ache and weak in the legs"  . Tramadol Hcl     REACTION: Tongue and lips swell    Past Medical History, Surgical history, Social history, and Family History were reviewed and updated.  Review of  Systems: Constitutional:  Negative for fever, chills, night sweats, anorexia, weight loss, pain. Cardiovascular: no chest pain or dyspnea on exertion Respiratory: negative Neurological: negative Dermatological: negative ENT: negative Skin: Negative. Gastrointestinal: negative Genito-Urinary: negative Hematological and Lymphatic: negative Breast: negative Musculoskeletal: negative Remaining ROS negative.  Physical Exam: Blood pressure 123/76, pulse 93, temperature 98.1 F (36.7 C), temperature source Oral, resp. rate 18, height 5\' 4"  (1.626 m), weight 177 lb 3.2 oz (80.377 kg). ECOG: 1 General appearance: alert Head: Normocephalic, without obvious abnormality, atraumatic Neck: no adenopathy, no carotid bruit, no JVD, supple, symmetrical, trachea midline and thyroid not enlarged, symmetric, no tenderness/mass/nodules Lymph nodes: Cervical, supraclavicular, and axillary nodes normal. Heart:regular rate and rhythm, S1, S2 normal, no murmur, click, rub or gallop Lung:chest clear, no wheezing, rales, normal symmetric air entry Abdomen: soft, non-tender, without masses or organomegaly EXT:no erythema, induration, or nodules   Lab Results: Lab Results  Component Value Date   WBC 9.1 11/20/2012   HGB 9.9* 11/20/2012   HCT 30.0* 11/20/2012   MCV 90.7 11/20/2012   PLT 458* 11/20/2012     Chemistry      Component Value Date/Time   NA 140 11/20/2012 0903   NA 138 09/18/2012 1355   K 4.6 11/20/2012 0903   K 5.5* 09/18/2012 1355   CL 102 09/25/2012 0933   CL 103 09/18/2012 1355   CO2 19* 11/20/2012 0903   CO2 24 09/18/2012 1355   BUN 17.2 11/20/2012 0903   BUN 38* 09/18/2012 1355   CREATININE 1.9* 11/20/2012 0903   CREATININE 2.23* 09/18/2012 1355      Component Value Date/Time   CALCIUM 9.4 11/20/2012 0903   CALCIUM 9.7 09/18/2012 1355   ALKPHOS 60 11/20/2012 0903   ALKPHOS 52 08/30/2011 1107   AST 17 11/20/2012 0903   AST 21 08/30/2011 1107   ALT 11 11/20/2012 0903   ALT 18 08/30/2011 1107    BILITOT 0.33 11/20/2012 0903   BILITOT 0.3 08/30/2011 1107     Imaging:  *RADIOLOGY REPORT*  Clinical Data: Rectal cancer with hepatic metastatic disease.  Oral chemotherapy ongoing.  CT CHEST, ABDOMEN AND PELVIS WITHOUT CONTRAST  Technique: Multidetector CT imaging of the chest, abdomen and  pelvis was performed following the standard protocol without IV  contrast.  Comparison: PET CT 09/21/2012. Chest, abdomen and pelvic CT  08/24/2012.  CT CHEST  Findings: Left subclavian Port-A-Cath tip extends to the  confluence of the brachiocephalic veins. There is stable  atherosclerosis of the aorta, great vessels and coronary arteries.  Small mediastinal lymph nodes are unchanged. No pathologically  enlarged lymph nodes are identified. There is no pleural or  pericardial effusion.  There are stable tiny subpleural nodules in the right lung on  images 17  and 26. No enlarging or otherwise suspicious nodules are  demonstrated.  IMPRESSION:  1. Stable examination with stable tiny subpleural pulmonary  nodules.  2. No findings suspicious for metastatic disease.  CT ABDOMEN AND PELVIS  Findings: The previously demonstrated dominant hypermetabolic  lesion centered in the medial segment of the left hepatic lobe has  decreased in size. This lesion is irregular shape and difficult to  measure, although measures approximately 5.9 x 5.9 cm on image 55  (previously 8.8 x 9.3 cm - remeasured). There is a small component  extending into the lateral segment. The other smaller lesions  demonstrated by PET CT are not readily demonstrated by noncontrast  CT. There are several hepatic cysts which are stable.  The gallbladder, spleen, pancreas and adrenal glands appear normal.  Bilateral renal cysts are stable.  Aortic stent graft has a stable appearance. Small lymph nodes  within the porta hepatis appear stable. There is no progressive  adenopathy. There is no ascites or peritoneal nodularity. No   focal abnormalities of the stomach, small bowel or colon are seen.  Scattered colonic diverticula are again noted.  There is no adnexal mass status post hysterectomy. The bladder is  nearly empty. No worrisome osseous findings are demonstrated.  IMPRESSION:  1. Interval decreased size of dominant hepatic metastasis  consistent with positive response to therapy. The additional  smaller lesions seen on PET CT are not visualized.  2. No evidence of extrahepatic metastatic disease.  Original Report Authenticated By: Carey Bullocks, M.D.    Impression and Plan:  Mrs. Ashley Davenport is a 79 year old woman with:  1. T3 N0 moderately differentiated adenocarcinoma of the rectum with evidence of LV invasion and positive surgical margins 05/30/2006. She is status post neoadjuvant continuous infusion 5-FU with external radiation therapy between July 10, 2006 and Sep 01, 2006. Now has stage IV disease and S/P 3 cycles of FOLFIRI with Avastin added with cycle 2. She developed grade 2-3 diarrhea, grade 2 fatigue, and weight loss. Cycle 4 was held due to these side effects. Her side effects are better today. CT scan showed decrease in size of the dominant hepatic mets. Recommend that she proceed with cycle 4 today with CPT-11 dose reduction (125 mg/m2).  She has requested a delay of her chemo due on 9/2 due to her daughter coming in from out of town. We will delay her chemo until 9/16 per her request.  2. Anemia: S/P Feraheme. Anemia and fatigue improving.   3. Neutropenia: Receives Neulasta with each cycle of chemotherapy.  4. nausea and vomiting prophylaxis: On Zofran and compazine.   5. HTN: She is on amlodipine and valsartan. She has been holding her BP meds due to being normotensive without them.  6. Follow up: 12/18/12 for cycle 5.     Clenton Pare 8/19/201410:20 AM   Patient seen and examined. The results of the CT scan discussed today in details with the patient and her son.  She reports  feeling much better with the iron infusion.  Her exam did not show any new abnormalities.  We plan to continue the current treatment with a reduced dose of chemotherapy. The plan discussed with the patient and she agrees.   [facility] MD 11/20/2012

## 2012-11-20 NOTE — Progress Notes (Signed)
PAC Accessed, repositioned pt.Unable to get blood return.  VO from NP for TPA. If still no blood return pt will need Dye Study to check port placement. Informed pt of NP orders. Pt verbalized understanding. 1155 TPA administered.TPA check at 1300 no blood return noted. Pt to received dye study for further evaluation

## 2012-11-20 NOTE — Patient Instructions (Addendum)
Alteplase, TPA injection What is this medicine? ALTEPLASE (AL te plase) can dissolve blood clots that form in the heart, blood vessels, or lungs after a heart attack. This medicine is also given to improve recovery and decrease the chance of disability in patients having symptoms of a stroke. This medicine may be used for other purposes; ask your health care provider or pharmacist if you have questions. What should I tell my health care provider before I take this medicine? They need to know if you have any of these conditions: -aneurysm -bleeding problems or problems with blood clotting -blood vessel disease or damaged blood vessels -diabetic retinopathy -head injury or tumor -high blood pressure -infection -irregular heartbeats -previous stroke -recent biopsy or surgery -an unusual or allergic reaction to alteplase, other medicines, foods, dyes, or preservatives -pregnant or trying to get pregnant -breast-feeding How should I use this medicine? This medicine is for injection into a vein. It is given by a health care professional in a hospital or clinic setting. Talk to your pediatrician regarding the use of this medicine in children. Special care may be needed. Overdosage: If you think you have taken too much of this medicine contact a poison control center or emergency room at once. NOTE: This medicine is only for you. Do not share this medicine with others. What if I miss a dose? This does not apply. What may interact with this medicine? Do not take this medicine with any of the following medications: -aminocaproic acid -aprotinin -tranexamic acid This medicine may also interact with the following medications: -antiinflammatory drugs, NSAIDs like ibuprofen -aspirin and aspirin-like medicines -blood thinners, like warfarin, heparin or enoxaparin -dipyridamole This list may not describe all possible interactions. Give your health care provider a list of all the medicines, herbs,  non-prescription drugs, or dietary supplements you use. Also tell them if you smoke, drink alcohol, or use illegal drugs. Some items may interact with your medicine. What should I watch for while using this medicine? Your condition will be monitored carefully while you are receiving this medicine. Follow the advice of your doctor or health care professional exactly. You may need bed rest to minimize the risk of bleeding. This medicine can make you bleed more easily. This effect can last for several days. Take special care brushing or flossing your teeth. Do not take aspirin, ibuprofen, or other nonprescription pain relievers during or for several days after alteplase treatment unless otherwise instructed by your doctor or health care professional. You may feel dizzy or lightheaded. To avoid the risk of dizzy or fainting spells, sit or stand up slowly, especially if you are an older patient. What side effects may I notice from receiving this medicine? Side effects that you should report to your doctor or health care professional as soon as possible: -allergic reactions like skin rash, itching or hives, swelling of the face, lips, or tongue -blood in the urine, stools, or vomit -chest pain or tightness -constipation -fever -severe headache -shortness of breath -slow or fast heart rate -unusual bleeding, bruising, or purple spots on the skin Side effects that usually do not require medical attention (report to your doctor or health care professional if they continue or are bothersome): -dizziness, lightheadedness -nausea, vomiting This list may not describe all possible side effects. Call your doctor for medical advice about side effects. You may report side effects to FDA at 1-800-FDA-1088. Where should I keep my medicine? This does not apply. You will not be given this medicine to store at   home. NOTE: This sheet is a summary. It may not cover all possible information. If you have questions  about this medicine, talk to your doctor, pharmacist, or health care provider.  2012, [employer]. (06/13/2007 10:47:08 AM) 

## 2012-11-20 NOTE — Telephone Encounter (Signed)
Pt arrived for treatment this am. Unable to get blood return from [facility]. Pt to have Dye study for port placement on 8/20at 8am. Treatment to be rescheduled for 8/21 at 10:15. Called pt at home and notified her to be at admissions around [phone] for check in and then Radiology for dye study at 8am. Treatment r/s for 8/21 at 10:15. Pt verbalized understanding and confirmed appt dates. Notified Provider and  RX of this change.

## 2012-11-21 ENCOUNTER — Telehealth: Payer: Self-pay | Admitting: Medical Oncology

## 2012-11-21 ENCOUNTER — Encounter (HOSPITAL_COMMUNITY): Payer: Self-pay | Admitting: Pharmacy Technician

## 2012-11-21 ENCOUNTER — Ambulatory Visit (HOSPITAL_COMMUNITY)
Admission: RE | Admit: 2012-11-21 | Discharge: 2012-11-21 | Disposition: A | Discharge status: Home / Self Care | Payer: Medicare Other | Source: Ambulatory Visit | Attending: Oncology | Admitting: Oncology

## 2012-11-21 ENCOUNTER — Other Ambulatory Visit: Payer: Self-pay | Admitting: Oncology

## 2012-11-21 DIAGNOSIS — C20 Malignant neoplasm of rectum: Secondary | ICD-10-CM

## 2012-11-21 DIAGNOSIS — Z452 Encounter for adjustment and management of vascular access device: Secondary | ICD-10-CM | POA: Insufficient documentation

## 2012-11-21 LAB — CEA: CEA: 14 ng/mL — ABNORMAL HIGH (ref 0.0–5.0)

## 2012-11-21 MED ORDER — HEPARIN SOD (PORK) LOCK FLUSH 100 UNIT/ML IV SOLN
500.0000 [IU] | Freq: Once | INTRAVENOUS | Status: AC
  Administered 2012-11-21: 500 [IU]

## 2012-11-21 MED ORDER — IOHEXOL 300 MG/ML  SOLN
10.0000 mL | Freq: Once | INTRAMUSCULAR | Status: AC | PRN
  Administered 2012-11-21: 5 mL via INTRAVENOUS

## 2012-11-21 NOTE — Telephone Encounter (Signed)
Ashley Davenport in radiology reports port a cath is up against vessel wall and she  was able to get a positive blood return after several attempts . Pt will have port replaced on Friday .  Does pt keep infusion appt tomorrow and leave port accessed? I told Ashley Davenport to leave port accessed and tell pt to keep infusion appt tomorrow.Note to Dr Clelia Croft and nurses.

## 2012-11-22 ENCOUNTER — Ambulatory Visit: Payer: Medicare Other

## 2012-11-22 ENCOUNTER — Other Ambulatory Visit: Payer: Self-pay | Admitting: Radiology

## 2012-11-22 ENCOUNTER — Ambulatory Visit (HOSPITAL_BASED_OUTPATIENT_CLINIC_OR_DEPARTMENT_OTHER): Payer: Medicare Other

## 2012-11-22 VITALS — BP 141/88 | HR 75 | Temp 97.9°F | Resp 20 | Ht 64.0 in

## 2012-11-22 DIAGNOSIS — Z5112 Encounter for antineoplastic immunotherapy: Secondary | ICD-10-CM

## 2012-11-22 DIAGNOSIS — Z5111 Encounter for antineoplastic chemotherapy: Secondary | ICD-10-CM

## 2012-11-22 DIAGNOSIS — C787 Secondary malignant neoplasm of liver and intrahepatic bile duct: Secondary | ICD-10-CM

## 2012-11-22 DIAGNOSIS — C20 Malignant neoplasm of rectum: Secondary | ICD-10-CM

## 2012-11-22 MED ORDER — SODIUM CHLORIDE 0.9 % IV SOLN
Freq: Once | INTRAVENOUS | Status: AC
  Administered 2012-11-22: 10:00:00 via INTRAVENOUS

## 2012-11-22 MED ORDER — ONDANSETRON 16 MG/50ML IVPB (CHCC)
16.0000 mg | Freq: Once | INTRAVENOUS | Status: AC
  Administered 2012-11-22: 16 mg via INTRAVENOUS

## 2012-11-22 MED ORDER — SODIUM CHLORIDE 0.9 % IJ SOLN
10.0000 mL | INTRAMUSCULAR | Status: DC | PRN
  Filled 2012-11-22: qty 10

## 2012-11-22 MED ORDER — LEUCOVORIN CALCIUM INJECTION 350 MG
400.0000 mg/m2 | Freq: Once | INTRAVENOUS | Status: AC
  Administered 2012-11-22: 792 mg via INTRAVENOUS
  Filled 2012-11-22: qty 39.6

## 2012-11-22 MED ORDER — ATROPINE SULFATE 1 MG/ML IJ SOLN
0.5000 mg | Freq: Once | INTRAMUSCULAR | Status: AC | PRN
  Administered 2012-11-22: 0.5 mg via INTRAVENOUS

## 2012-11-22 MED ORDER — IRINOTECAN HCL CHEMO INJECTION 100 MG/5ML
125.0000 mg/m2 | Freq: Once | INTRAVENOUS | Status: AC
  Administered 2012-11-22: 248 mg via INTRAVENOUS
  Filled 2012-11-22: qty 12.4

## 2012-11-22 MED ORDER — FLUOROURACIL CHEMO INJECTION 2.5 GM/50ML
400.0000 mg/m2 | Freq: Once | INTRAVENOUS | Status: AC
  Administered 2012-11-22: 800 mg via INTRAVENOUS
  Filled 2012-11-22: qty 16

## 2012-11-22 MED ORDER — SODIUM CHLORIDE 0.9 % IV SOLN
5.0000 mg/kg | Freq: Once | INTRAVENOUS | Status: AC
  Administered 2012-11-22: 425 mg via INTRAVENOUS
  Filled 2012-11-22: qty 17

## 2012-11-22 MED ORDER — SODIUM CHLORIDE 0.9 % IV SOLN: 5.0000 mg/kg | Freq: Once | INTRAVENOUS | Status: DC

## 2012-11-22 MED ORDER — DEXAMETHASONE SODIUM PHOSPHATE 20 MG/5ML IJ SOLN
20.0000 mg | Freq: Once | INTRAMUSCULAR | Status: AC
  Administered 2012-11-22: 20 mg via INTRAVENOUS

## 2012-11-22 MED ORDER — SODIUM CHLORIDE 0.9 % IV SOLN
2400.0000 mg/m2 | INTRAVENOUS | Status: DC
  Administered 2012-11-22: 4750 mg via INTRAVENOUS
  Filled 2012-11-22: qty 95

## 2012-11-22 NOTE — Progress Notes (Signed)
Ashley Davenport - Interventional Radiology - port to be replaced tomorrow 8/22.  Chemotherapy pump needs to be completely disconnected prior to arrival at IR.  Requested that needle be left in so that patient can be reconnected to chemo pump after PAC insertion.

## 2012-11-23 ENCOUNTER — Inpatient Hospital Stay (HOSPITAL_COMMUNITY): Admission: RE | Admit: 2012-11-23 | Payer: Medicare Other | Source: Ambulatory Visit

## 2012-11-23 ENCOUNTER — Ambulatory Visit (HOSPITAL_COMMUNITY)
Admission: RE | Admit: 2012-11-23 | Payer: Medicare Other | Source: Ambulatory Visit | Attending: Internal Medicine | Admitting: Internal Medicine

## 2012-11-24 ENCOUNTER — Ambulatory Visit (HOSPITAL_BASED_OUTPATIENT_CLINIC_OR_DEPARTMENT_OTHER): Payer: Medicare Other

## 2012-11-24 VITALS — BP 155/71 | HR 64 | Temp 97.9°F

## 2012-11-24 DIAGNOSIS — C787 Secondary malignant neoplasm of liver and intrahepatic bile duct: Secondary | ICD-10-CM

## 2012-11-24 DIAGNOSIS — C20 Malignant neoplasm of rectum: Secondary | ICD-10-CM

## 2012-11-24 DIAGNOSIS — Z5189 Encounter for other specified aftercare: Secondary | ICD-10-CM

## 2012-11-24 MED ORDER — PEGFILGRASTIM INJECTION 6 MG/0.6ML
6.0000 mg | Freq: Once | SUBCUTANEOUS | Status: AC
  Administered 2012-11-24: 6 mg via SUBCUTANEOUS

## 2012-11-24 MED ORDER — SODIUM CHLORIDE 0.9 % IJ SOLN
10.0000 mL | INTRAMUSCULAR | Status: DC | PRN
  Administered 2012-11-24: 10 mL
  Filled 2012-11-24: qty 10

## 2012-11-24 MED ORDER — HEPARIN SOD (PORK) LOCK FLUSH 100 UNIT/ML IV SOLN
500.0000 [IU] | Freq: Once | INTRAVENOUS | Status: AC | PRN
  Administered 2012-11-24: 500 [IU]
  Filled 2012-11-24: qty 5

## 2012-11-25 ENCOUNTER — Telehealth: Payer: Self-pay | Admitting: Oncology

## 2012-11-25 NOTE — Telephone Encounter (Signed)
lmonvm for pt re cx'ng 9/2 appts and gv next appt for 9/16. Schedule mailed.

## 2012-11-30 ENCOUNTER — Telehealth: Payer: Self-pay | Admitting: Dietician

## 2012-11-30 ENCOUNTER — Other Ambulatory Visit: Payer: Self-pay | Admitting: *Deleted

## 2012-11-30 DIAGNOSIS — I714 Abdominal aortic aneurysm, without rupture: Secondary | ICD-10-CM

## 2012-11-30 DIAGNOSIS — Z48812 Encounter for surgical aftercare following surgery on the circulatory system: Secondary | ICD-10-CM

## 2012-12-04 ENCOUNTER — Ambulatory Visit: Payer: Medicare Other | Admitting: Oncology

## 2012-12-04 ENCOUNTER — Ambulatory Visit: Payer: Medicare Other

## 2012-12-04 ENCOUNTER — Other Ambulatory Visit: Payer: Medicare Other | Admitting: Lab

## 2012-12-06 ENCOUNTER — Ambulatory Visit: Payer: Medicare Other

## 2012-12-10 ENCOUNTER — Encounter: Payer: Self-pay | Admitting: Vascular Surgery

## 2012-12-11 ENCOUNTER — Encounter (INDEPENDENT_AMBULATORY_CARE_PROVIDER_SITE_OTHER): Payer: Medicare Other | Admitting: *Deleted

## 2012-12-11 ENCOUNTER — Ambulatory Visit (INDEPENDENT_AMBULATORY_CARE_PROVIDER_SITE_OTHER): Payer: Medicare Other | Admitting: Vascular Surgery

## 2012-12-11 ENCOUNTER — Encounter: Payer: Self-pay | Admitting: Vascular Surgery

## 2012-12-11 VITALS — BP 147/81 | HR 74 | Ht 64.0 in | Wt 175.7 lb

## 2012-12-11 DIAGNOSIS — I714 Abdominal aortic aneurysm, without rupture: Secondary | ICD-10-CM

## 2012-12-11 DIAGNOSIS — Z48812 Encounter for surgical aftercare following surgery on the circulatory system: Secondary | ICD-10-CM

## 2012-12-11 NOTE — Progress Notes (Signed)
Subjective:     Patient ID: Ashley Davenport, female   DOB: Sep 12, 1930, 79 y.o.   MRN: [id number]  HPI this 79 year old female returns for annual followup regarding her aortic stent graft for abdominal aortic aneurysm which I placed in 2011. She's had no abdominal or back symptoms. She does have a recent diagnosis of a metastatic lesion in her liver perceivably from rectal cancer which was treated in 2008. She is currently on chemotherapy.  Past Medical History  Diagnosis Date  . AAA (abdominal aortic aneurysm) 2011  . Hyperlipidemia   . Hypertension   . Shingles     POST HERPATIC PAIN FROM SHINGLES  . Cancer     Rectal  . History of acute gouty arthritis   . Rectal cancer   . Kidney cysts   . Anemia     HX OF ANEMIA WITH CHEMO 2008  . Hypothyroidism     History  Substance Use Topics  . Smoking status: Former Smoker -- 1.00 packs/day for 20 years    Types: Cigarettes    Quit date: 04/04/1988  . Smokeless tobacco: Not on file  . Alcohol Use: Yes     Comment: RARE    Family History  Problem Relation Age of Onset  . Hypertension Mother   . Heart disease Mother   . Heart attack Mother   . Hypertension Father   . Heart disease Father   . Diabetes Father   . Heart attack Father   . Cancer Sister     ovarian  . Diabetes Sister   . Hyperlipidemia Sister   . Hypertension Sister   . Cancer Cousin     breast  . Cancer Cousin     breast  . Diabetes Daughter   . Hyperlipidemia Daughter   . Hypertension Daughter     Allergies  Allergen Reactions  . Shellfish Allergy Itching and Rash    Feels itching internally.  . Meperidine Hcl     REACTION: Hypotension  . Rosuvastatin Other (See Comments)    "Makes my muscles ache and weak in the legs"  . Tramadol Hcl     REACTION: Tongue and lips swell    Current outpatient prescriptions:acetaminophen (TYLENOL) 500 MG tablet, Take 1,000 mg by mouth every 6 (six) hours as needed for pain., Disp: , Rfl: ;  allopurinol  (ZYLOPRIM) 150 mg TABS, Take 150 mg by mouth every morning. , Disp: , Rfl: ;  amLODipine (NORVASC) 10 MG tablet, Take 10 mg by mouth every morning., Disp: , Rfl: ;  Biotin 5000 MCG CAPS, Take 5,000 mcg by mouth every morning. , Disp: , Rfl:  Coenzyme Q10 (COQ-10) 200 MG CAPS, Take 200 mg by mouth at bedtime. , Disp: , Rfl: ;  furosemide (LASIX) 20 MG tablet, Take 20 mg by mouth every morning. , Disp: , Rfl: ;  levothyroxine (SYNTHROID, LEVOTHROID) 75 MCG tablet, Take 75 mcg by mouth every morning. , Disp: , Rfl: ;  lidocaine-prilocaine (EMLA) cream, Apply topically as needed. Apply to port before chemotherapy, Disp: 30 g, Rfl: 0 ondansetron (ZOFRAN) 8 MG tablet, Take 1 tablet (8 mg total) by mouth every 8 (eight) hours as needed for nausea., Disp: 20 tablet, Rfl: 2;  pravastatin (PRAVACHOL) 20 MG tablet, Take 20 mg by mouth at bedtime. , Disp: , Rfl: ;  Prenat w/o A-FeCbGl-DSS-FA-DHA (CITRANATAL DHA PO), Take 1 tablet by mouth every morning., Disp: , Rfl:  prochlorperazine (COMPAZINE) 10 MG tablet, Take 1 tablet (10 mg total) by mouth  every 6 (six) hours as needed., Disp: 30 tablet, Rfl: 2;  valsartan (DIOVAN) 160 MG tablet, Take 160 mg by mouth every morning. , Disp: , Rfl:  No current facility-administered medications for this visit. Facility-Administered Medications Ordered in Other Visits: 0.9 %  sodium chloride infusion, , Intravenous, Once, Benjiman Core, MD  BP 147/81  Pulse 74  Ht 5\' 4"  (1.626 m)  Wt 175 lb 11.2 oz (79.697 kg)  BMI 30.14 kg/m2  SpO2 99%  Body mass index is 30.14 kg/(m^2).           Review of Systems denies chest pain but does have dyspnea on exertion. Denies lateralizing weakness, aphasia, amaurosis fugax, diplopia, blurred vision, and syncope. All other systems negative complete review of systems     Objective:   Physical Exam BP 147/81  Pulse 74  Ht 5\' 4"  (1.626 m)  Wt 175 lb 11.2 oz (79.697 kg)  BMI 30.14 kg/m2  SpO2 99%  Gen.-alert and oriented x3 in  no apparent distress HEENT normal for age Lungs no rhonchi or wheezing Cardiovascular regular rhythm no murmurs carotid pulses 3+ palpable no bruits audible Abdomen soft nontender no palpable masses Musculoskeletal free of  major deformities Skin clear -no rashes Neurologic normal Lower extremities 3+ femoral and dorsalis pedis pulses palpable bilaterally with no edema  Today I ordered a duplex scan of the abdominal aorta which are reviewed and interpreted. The maximum diameter of the aneurysm today is 4.25 cm down from 4.8 cm one year ago. There is no evidence of endoleak.     Assessment:     Doing well 3 years post aortic stent graft for abdominal aortic aneurysm with contraction of aneurysm sac and no evidence of endoleak Recent diagnosis of metastatic disease to liver which is treated with chemotherapy a previous rectal cancer    Plan:     Return in one year for duplex scan continued followup of aortic aneurysm stent graft repair

## 2012-12-14 ENCOUNTER — Telehealth: Payer: Self-pay | Admitting: Oncology

## 2012-12-14 NOTE — Telephone Encounter (Signed)
gv pt appt schedule for December. °

## 2012-12-18 ENCOUNTER — Ambulatory Visit (HOSPITAL_BASED_OUTPATIENT_CLINIC_OR_DEPARTMENT_OTHER): Payer: Medicare Other | Admitting: Oncology

## 2012-12-18 ENCOUNTER — Other Ambulatory Visit: Payer: Self-pay | Admitting: Radiology

## 2012-12-18 ENCOUNTER — Telehealth: Payer: Self-pay | Admitting: Oncology

## 2012-12-18 ENCOUNTER — Ambulatory Visit: Payer: Medicare Other

## 2012-12-18 ENCOUNTER — Other Ambulatory Visit (HOSPITAL_BASED_OUTPATIENT_CLINIC_OR_DEPARTMENT_OTHER): Payer: Medicare Other | Admitting: Lab

## 2012-12-18 ENCOUNTER — Ambulatory Visit (HOSPITAL_BASED_OUTPATIENT_CLINIC_OR_DEPARTMENT_OTHER): Payer: Medicare Other

## 2012-12-18 VITALS — BP 157/82 | HR 74 | Temp 98.3°F | Resp 19 | Ht 64.0 in | Wt 177.8 lb

## 2012-12-18 DIAGNOSIS — C787 Secondary malignant neoplasm of liver and intrahepatic bile duct: Secondary | ICD-10-CM

## 2012-12-18 DIAGNOSIS — Z452 Encounter for adjustment and management of vascular access device: Secondary | ICD-10-CM

## 2012-12-18 DIAGNOSIS — C20 Malignant neoplasm of rectum: Secondary | ICD-10-CM

## 2012-12-18 LAB — COMPREHENSIVE METABOLIC PANEL (CC13)
ALT: 9 U/L (ref 0–55)
AST: 20 U/L (ref 5–34)
Albumin: 3.3 g/dL — ABNORMAL LOW (ref 3.5–5.0)
BUN: 22.9 mg/dL (ref 7.0–26.0)
Calcium: 9.5 mg/dL (ref 8.4–10.4)
Chloride: 104 mEq/L (ref 98–109)
Potassium: 4 mEq/L (ref 3.5–5.1)
Sodium: 140 mEq/L (ref 136–145)
Total Protein: 7 g/dL (ref 6.4–8.3)

## 2012-12-18 LAB — CBC WITH DIFFERENTIAL/PLATELET
Basophils Absolute: 0 10*3/uL (ref 0.0–0.1)
EOS%: 2.3 % (ref 0.0–7.0)
HGB: 11.5 g/dL — ABNORMAL LOW (ref 11.6–15.9)
MCH: 30.3 pg (ref 25.1–34.0)
NEUT#: 4.6 10*3/uL (ref 1.5–6.5)
RBC: 3.79 10*6/uL (ref 3.70–5.45)
RDW: 19.1 % — ABNORMAL HIGH (ref 11.2–14.5)
lymph#: 0.8 10*3/uL — ABNORMAL LOW (ref 0.9–3.3)

## 2012-12-18 MED ORDER — SODIUM CHLORIDE 0.9 % IJ SOLN
10.0000 mL | INTRAMUSCULAR | Status: DC | PRN
  Administered 2012-12-18: 10 mL
  Filled 2012-12-18: qty 10

## 2012-12-18 MED ORDER — HEPARIN SOD (PORK) LOCK FLUSH 100 UNIT/ML IV SOLN
500.0000 [IU] | Freq: Once | INTRAVENOUS | Status: AC | PRN
  Administered 2012-12-18: 500 [IU]
  Filled 2012-12-18: qty 5

## 2012-12-18 NOTE — Progress Notes (Signed)
Hematology and Oncology Follow Up Visit  Ashley Davenport [id number] 07-Jun-1930 79 y.o. 12/18/2012 8:41 AM [name], MDClark, Lindell Spar, MD   Principle Diagnosis: 79 year old woman with rectal cancer and anemia of chronic disease secondary to kidney disease. She was diagnosed with rectal cancer on  09/01/2006.  She hadT3 N0 moderately differentiated adenocarcinoma of the rectum with evidence of LV invasion. Now she has stage IV disease with liver mets.    Prior Therapy: She is status post neoadjuvant continuous infusion 5-FU with external radiation therapy between July 10, 2006 and Sep 01, 2006.  She declined abdominoperineal resection. She went on to receive additional 3 cycles of Xeloda, but developed hand-foot syndrome and declined further therapy.   Current therapy: She is on FOLFIRI + Avastin. First cycle given on 6/27 with FOLFIRI alone. Cycle 4 was delayed due to grade 2 fatigue and grade 2-3 diarrhea. She is here to resume cycle 5 of chemo with dose reduction of her Irinotecan to 125 mg/m2.  Interim History: Ashley Davenport presents today for a follow up visit. She is a very nice women with the above diagnosis. Fatigue is better today. Remains independent in ADLs. Appetite is improving and weight is stable. Besides pain secondary to neuralgia from shingles, she has no abnormal discomfort/pain. Diarrhea is better. No nausea or vomiting. No chest pain, shortness of breath, or dyspnea. No new complications from chemotherapy. She felt better with 4 weeks off treatments.  She is ready to proceed.     Medications: I have reviewed the patient's current medications.  Current Outpatient Prescriptions  Medication Sig Dispense Refill  . acetaminophen (TYLENOL) 500 MG tablet Take 1,000 mg by mouth every 6 (six) hours as needed for pain.      Marland Kitchen allopurinol (ZYLOPRIM) 150 mg TABS Take 150 mg by mouth every morning.       Marland Kitchen amLODipine (NORVASC) 10 MG tablet Take 10 mg by mouth every morning.       . Biotin 5000 MCG CAPS Take 5,000 mcg by mouth every morning.       . Coenzyme Q10 (COQ-10) 200 MG CAPS Take 200 mg by mouth at bedtime.       . furosemide (LASIX) 20 MG tablet Take 20 mg by mouth every morning.       Marland Kitchen levothyroxine (SYNTHROID, LEVOTHROID) 75 MCG tablet Take 75 mcg by mouth every morning.       . lidocaine-prilocaine (EMLA) cream Apply topically as needed. Apply to port before chemotherapy  30 g  0  . ondansetron (ZOFRAN) 8 MG tablet Take 1 tablet (8 mg total) by mouth every 8 (eight) hours as needed for nausea.  20 tablet  2  . pravastatin (PRAVACHOL) 20 MG tablet Take 20 mg by mouth at bedtime.       . Prenat w/o A-FeCbGl-DSS-FA-DHA (CITRANATAL DHA PO) Take 1 tablet by mouth every morning.      . prochlorperazine (COMPAZINE) 10 MG tablet Take 1 tablet (10 mg total) by mouth every 6 (six) hours as needed.  30 tablet  2  . valsartan (DIOVAN) 160 MG tablet Take 160 mg by mouth every morning.        No current facility-administered medications for this visit.   Facility-Administered Medications Ordered in Other Visits  Medication Dose Route Frequency Provider Last Rate Last Dose  . 0.9 %  sodium chloride infusion   Intravenous Once Benjiman Core, MD         Allergies:  Allergies  Allergen Reactions  . Shellfish Allergy Itching and Rash    Feels itching internally.  . Meperidine Hcl     REACTION: Hypotension  . Rosuvastatin Other (See Comments)    "Makes my muscles ache and weak in the legs"  . Tramadol Hcl     REACTION: Tongue and lips swell    Past Medical History, Surgical history, Social history, and Family History were reviewed and updated.  Review of Systems:  Remaining ROS negative.  Physical Exam: Blood pressure 157/82, pulse 74, temperature 98.3 F (36.8 C), temperature source Oral, resp. rate 19, height 5\' 4"  (1.626 m), weight 177 lb 12.8 oz (80.65 kg). ECOG: 1 General appearance: alert Head: Normocephalic, without obvious abnormality,  atraumatic Neck: no adenopathy, no carotid bruit, no JVD, supple, symmetrical, trachea midline and thyroid not enlarged, symmetric, no tenderness/mass/nodules Lymph nodes: Cervical, supraclavicular, and axillary nodes normal. Heart:regular rate and rhythm, S1, S2 normal, no murmur, click, rub or gallop Lung:chest clear, no wheezing, rales, normal symmetric air entry Abdomen: soft, non-tender, without masses or organomegaly EXT:no erythema, induration, or nodules   Lab Results: Lab Results  Component Value Date   WBC 6.5 12/18/2012   HGB 11.5* 12/18/2012   HCT 35.6 12/18/2012   MCV 93.9 12/18/2012   PLT 266 12/18/2012     Chemistry      Component Value Date/Time   NA 140 11/20/2012 0903   NA 138 09/18/2012 1355   K 4.6 11/20/2012 0903   K 5.5* 09/18/2012 1355   CL 102 09/25/2012 0933   CL 103 09/18/2012 1355   CO2 19* 11/20/2012 0903   CO2 24 09/18/2012 1355   BUN 17.2 11/20/2012 0903   BUN 38* 09/18/2012 1355   CREATININE 1.9* 11/20/2012 0903   CREATININE 2.23* 09/18/2012 1355      Component Value Date/Time   CALCIUM 9.4 11/20/2012 0903   CALCIUM 9.7 09/18/2012 1355   ALKPHOS 60 11/20/2012 0903   ALKPHOS 52 08/30/2011 1107   AST 17 11/20/2012 0903   AST 21 08/30/2011 1107   ALT 11 11/20/2012 0903   ALT 18 08/30/2011 1107   BILITOT 0.33 11/20/2012 0903   BILITOT 0.3 08/30/2011 1107     Imaging:   Impression and Plan:  Ashley Davenport is a 79 year old woman with:  1. T3 N0 moderately differentiated adenocarcinoma of the rectum with evidence of LV invasion and positive surgical margins 05/30/2006. She is status post neoadjuvant continuous infusion 5-FU with external radiation therapy between July 10, 2006 and Sep 01, 2006. Now has stage IV disease and S/P 4 cycles of FOLFIRI with Avastin added with cycle 2. She developed grade 2-3 diarrhea, grade 2 fatigue, and weight loss. CT scan from 11/2012 showed decrease in size of the dominant hepatic mets. Recommend that she proceed with cycle 5 today  with CPT-11 dose reduction (125 mg/m2).    2. Anemia: S/P Feraheme. Anemia and fatigue improving.   3. Neutropenia: Receives Neulasta with each cycle of chemotherapy.  4. nausea and vomiting prophylaxis: On Zofran and compazine.   5. HTN: She is on amlodipine and valsartan. She has been holding her BP meds due to being normotensive without them.  6. Follow up: 01/01/2013 for cycle 6.      Darivs Lunden 9/16/20148:41 AM

## 2012-12-18 NOTE — Telephone Encounter (Signed)
Gave pt appt for lab and MD for September and October emailed Marcelino Duster  regarding chemo

## 2012-12-18 NOTE — Progress Notes (Signed)
Unable to obtain blood return from [facility].  PAC reaccessed by Vincent Peyer, RN and again no blood return.  Per pt she was to have port revision, however revision was cancelled.  Noted radiology report that states port is not in good position for long term use.  Diane followed up with Dr. Clelia Croft and radiology.  Chemo cancelled for today, port to be revised before proceeding with chemo.

## 2012-12-18 NOTE — Telephone Encounter (Signed)
Talked to pt, her chemo was cancelled today due to port not flushing , waitingn for instruction of whether to r/s chemo

## 2012-12-18 NOTE — Telephone Encounter (Signed)
Per staff message and POF I have scheduled appts.  JMW  

## 2012-12-19 ENCOUNTER — Other Ambulatory Visit: Payer: Self-pay | Admitting: Radiology

## 2012-12-19 ENCOUNTER — Other Ambulatory Visit: Payer: Self-pay | Admitting: Oncology

## 2012-12-19 DIAGNOSIS — C20 Malignant neoplasm of rectum: Secondary | ICD-10-CM

## 2012-12-20 ENCOUNTER — Other Ambulatory Visit: Payer: Self-pay | Admitting: Certified Registered Nurse Anesthetist

## 2012-12-20 ENCOUNTER — Ambulatory Visit: Payer: Medicare Other

## 2012-12-20 ENCOUNTER — Ambulatory Visit (HOSPITAL_BASED_OUTPATIENT_CLINIC_OR_DEPARTMENT_OTHER): Payer: Medicare Other

## 2012-12-20 NOTE — Progress Notes (Signed)
Patient did not have treatment.

## 2012-12-21 ENCOUNTER — Ambulatory Visit (HOSPITAL_COMMUNITY)
Admission: RE | Admit: 2012-12-21 | Discharge: 2012-12-21 | Disposition: A | Discharge status: Home / Self Care | Payer: Medicare Other | Source: Ambulatory Visit | Attending: Oncology | Admitting: Oncology

## 2012-12-21 ENCOUNTER — Other Ambulatory Visit: Payer: Self-pay | Admitting: Oncology

## 2012-12-21 ENCOUNTER — Encounter (HOSPITAL_COMMUNITY): Payer: Self-pay

## 2012-12-21 DIAGNOSIS — C20 Malignant neoplasm of rectum: Secondary | ICD-10-CM

## 2012-12-21 DIAGNOSIS — E039 Hypothyroidism, unspecified: Secondary | ICD-10-CM | POA: Insufficient documentation

## 2012-12-21 DIAGNOSIS — C787 Secondary malignant neoplasm of liver and intrahepatic bile duct: Secondary | ICD-10-CM | POA: Insufficient documentation

## 2012-12-21 DIAGNOSIS — Y831 Surgical operation with implant of artificial internal device as the cause of abnormal reaction of the patient, or of later complication, without mention of misadventure at the time of the procedure: Secondary | ICD-10-CM | POA: Insufficient documentation

## 2012-12-21 DIAGNOSIS — I10 Essential (primary) hypertension: Secondary | ICD-10-CM | POA: Insufficient documentation

## 2012-12-21 DIAGNOSIS — T82598A Other mechanical complication of other cardiac and vascular devices and implants, initial encounter: Secondary | ICD-10-CM | POA: Insufficient documentation

## 2012-12-21 DIAGNOSIS — E785 Hyperlipidemia, unspecified: Secondary | ICD-10-CM | POA: Insufficient documentation

## 2012-12-21 LAB — PROTIME-INR: Prothrombin Time: 12.4 seconds (ref 11.6–15.2)

## 2012-12-21 MED ORDER — SODIUM CHLORIDE 0.9 % IV SOLN
INTRAVENOUS | Status: DC
  Administered 2012-12-21: 12:00:00 via INTRAVENOUS

## 2012-12-21 MED ORDER — CEFAZOLIN SODIUM-DEXTROSE 2-3 GM-% IV SOLR
2.0000 g | INTRAVENOUS | Status: AC
  Administered 2012-12-21: 2 g via INTRAVENOUS

## 2012-12-21 MED ORDER — LIDOCAINE HCL 1 % IJ SOLN
INTRAMUSCULAR | Status: AC
  Filled 2012-12-21: qty 20

## 2012-12-21 MED ORDER — CEFAZOLIN SODIUM-DEXTROSE 2-3 GM-% IV SOLR
INTRAVENOUS | Status: AC
  Filled 2012-12-21: qty 50

## 2012-12-21 MED ORDER — MIDAZOLAM HCL 2 MG/2ML IJ SOLN
INTRAMUSCULAR | Status: AC
  Filled 2012-12-21: qty 6

## 2012-12-21 MED ORDER — MIDAZOLAM HCL 2 MG/2ML IJ SOLN
INTRAMUSCULAR | Status: AC | PRN
  Administered 2012-12-21 (×4): 0.5 mg via INTRAVENOUS
  Administered 2012-12-21: 1 mg via INTRAVENOUS
  Administered 2012-12-21: 0.5 mg via INTRAVENOUS

## 2012-12-21 MED ORDER — HEPARIN SOD (PORK) LOCK FLUSH 100 UNIT/ML IV SOLN
INTRAVENOUS | Status: AC | PRN
  Administered 2012-12-21: 500 [IU]

## 2012-12-21 MED ORDER — FENTANYL CITRATE 0.05 MG/ML IJ SOLN
INTRAMUSCULAR | Status: AC | PRN
  Administered 2012-12-21 (×3): 25 ug via INTRAVENOUS
  Administered 2012-12-21: 100 ug via INTRAVENOUS

## 2012-12-21 MED ORDER — FENTANYL CITRATE 0.05 MG/ML IJ SOLN
INTRAMUSCULAR | Status: AC
  Filled 2012-12-21: qty 6

## 2012-12-21 NOTE — H&P (Signed)
Chief Complaint:  "I am here for port removal and placement of a new port catheter" Referring Physician: Dr. Clelia Croft HPI: Ashley Davenport is an 79 y.o. female with PMHx rectal cancer with metastasis to the liver. She previously had a left IJ port catheter placed by Dr. Donell Beers, recent difficulty with blood return from port and CV line injection revealed the tip of  catheter is against the SVC wall. The patient has been scheduled today for removal of left port and placement of new port a catheter. She states she previously has had a right port placed and removed secondary to no longer needing the port at that time. She denies any treatment to chest wall. She denies any chest pain or shortness of breath. She denies any blood in her stool or urine. She denies any fever or chills.  Past Medical History:  Past Medical History  Diagnosis Date  . AAA (abdominal aortic aneurysm) 2011  . Hyperlipidemia   . Hypertension   . Shingles     POST HERPATIC PAIN FROM SHINGLES  . Cancer     Rectal  . History of acute gouty arthritis   . Rectal cancer   . Kidney cysts   . Anemia     HX OF ANEMIA WITH CHEMO 2008  . Hypothyroidism     Past Surgical History:  Past Surgical History  Procedure Laterality Date  . Abdominal hysterectomy  1961    total  . Tonsillectomy  1949  . Bunionectomy Bilateral   . Rectal tumor removed  2008    CANCEROUS  . Abdominal aorta aneurysm repair -stent  2011  . Portacath placement N/A 09/24/2012    Procedure: INSERTION PORT-A-CATH;  Surgeon: Almond Lint, MD;  Location: [facility];  Service: General;  Laterality: N/A;  . Abdominal aortic aneurysm repair      Family History:  Family History  Problem Relation Age of Onset  . Hypertension Mother   . Heart disease Mother   . Heart attack Mother   . Hypertension Father   . Heart disease Father   . Diabetes Father   . Heart attack Father   . Cancer Sister     ovarian  . Diabetes Sister   . Hyperlipidemia Sister   .  Hypertension Sister   . Cancer Cousin     breast  . Cancer Cousin     breast  . Diabetes Daughter   . Hyperlipidemia Daughter   . Hypertension Daughter     Social History:  reports that she quit smoking about 24 years ago. Her smoking use included Cigarettes. She has a 20 pack-year smoking history. She does not have any smokeless tobacco history on file. She reports that  drinks alcohol. She reports that she does not use illicit drugs.  Allergies:  Allergies  Allergen Reactions  . Shellfish Allergy Itching and Rash    Feels itching internally.  . Meperidine Hcl     REACTION: Hypotension  . Rosuvastatin Other (See Comments)    "Makes my muscles ache and weak in the legs"  . Tramadol Hcl     REACTION: Tongue and lips swell    Medications:   Medication List    Notice   Cannot display patient medications because the patient has not yet been checked in.      Please HPI for pertinent positives, otherwise complete 10 system ROS negative.  Physical Exam: BP 168/89  Pulse 80  Temp(Src) 97.9 F (36.6 C) (Oral)  Resp 18  SpO2  100% There is no weight on file to calculate BMI.   General Appearance:  Alert, cooperative, no distress, appears stated age  Head:  Normocephalic, without obvious abnormality, atraumatic  Neck: Supple, symmetrical, trachea midline  Lungs:   Clear to auscultation bilaterally, no w/r/r, respirations unlabored without use of accessory muscles.  Chest Wall:  No tenderness. Left port catheter intact, right anterior chest wall scarring from previous right sided port catheter.  Heart:  Regular rate and rhythm, S1, S2 normal, no murmur, rub or gallop.  Abdomen:   Soft, non-tender, non distended.  Extremities: Extremities normal, atraumatic, no cyanosis or edema  Pulses: 1+ and symmetric  Neurologic: Normal affect, no gross deficits.   Recent Results (from the past 2160 hour(s))  COMPREHENSIVE METABOLIC PANEL (CC13)     Status: Abnormal   Collection Time     09/25/12  9:33 AM      Result Value Range   Sodium 135 (*) 136 - 145 mEq/L   Potassium 5.0  3.5 - 5.1 mEq/L   Chloride 102  98 - 107 mEq/L   CO2 20 (*) 22 - 29 mEq/L   Glucose 137 (*) 70 - 99 mg/dl   BUN 21.3 (*) 7.0 - 08.6 mg/dL   Creatinine 2.6 (*) 0.6 - 1.1 mg/dL   Total Bilirubin 5.78  0.20 - 1.20 mg/dL   Alkaline Phosphatase 55  40 - 150 U/L   AST 47 (*) 5 - 34 U/L   ALT 14  0 - 55 U/L   Total Protein 7.7  6.4 - 8.3 g/dL   Albumin 3.4 (*) 3.5 - 5.0 g/dL   Calcium 9.5  8.4 - 46.9 mg/dL  CEA     Status: Abnormal   Collection Time    09/25/12  9:33 AM      Result Value Range   CEA 80.9 (*) 0.0 - 5.0 ng/mL  CBC WITH DIFFERENTIAL     Status: Abnormal   Collection Time    09/25/12  9:34 AM      Result Value Range   WBC 13.4 (*) 3.9 - 10.3 10e3/uL   NEUT# 12.1 (*) 1.5 - 6.5 10e3/uL   HGB 8.4 (*) 11.6 - 15.9 g/dL   HCT 62.9 (*) 52.8 - 41.3 %   Platelets 409 (*) 145 - 400 10e3/uL   MCV 89.9  79.5 - 101.0 fL   MCH 29.9  25.1 - 34.0 pg   MCHC 33.3  31.5 - 36.0 g/dL   RBC 2.44 (*) 0.10 - 2.72 10e6/uL   RDW 16.2 (*) 11.2 - 14.5 %   lymph# 0.4 (*) 0.9 - 3.3 10e3/uL   MONO# 0.9  0.1 - 0.9 10e3/uL   Eosinophils Absolute 0.0  0.0 - 0.5 10e3/uL   Basophils Absolute 0.0  0.0 - 0.1 10e3/uL   NEUT% 90.2 (*) 38.4 - 76.8 %   LYMPH% 2.9 (*) 14.0 - 49.7 %   MONO% 6.9  0.0 - 14.0 %   EOS% 0.0  0.0 - 7.0 %   BASO% 0.0  0.0 - 2.0 %  CBC WITH DIFFERENTIAL     Status: Abnormal   Collection Time    10/09/12  8:20 AM      Result Value Range   WBC 2.3 (*) 3.9 - 10.3 10e3/uL   NEUT# 0.7 (*) 1.5 - 6.5 10e3/uL   HGB 8.3 (*) 11.6 - 15.9 g/dL   HCT 53.6 (*) 64.4 - 03.4 %   Platelets 460 (*) 145 - 400 10e3/uL  MCV 90.2  79.5 - 101.0 fL   MCH 29.0  25.1 - 34.0 pg   MCHC 32.2  31.5 - 36.0 g/dL   RBC 1.61 (*) 0.96 - 0.45 10e6/uL   RDW 15.2 (*) 11.2 - 14.5 %   lymph# 0.9  0.9 - 3.3 10e3/uL   MONO# 0.4  0.1 - 0.9 10e3/uL   Eosinophils Absolute 0.3  0.0 - 0.5 10e3/uL   Basophils Absolute  0.0  0.0 - 0.1 10e3/uL   NEUT% 29.8 (*) 38.4 - 76.8 %   LYMPH% 38.5  14.0 - 49.7 %   MONO% 18.4 (*) 0.0 - 14.0 %   EOS% 12.4 (*) 0.0 - 7.0 %   BASO% 0.9  0.0 - 2.0 %   nRBC 1 (*) 0 - 0 %  COMPREHENSIVE METABOLIC PANEL (CC13)     Status: Abnormal   Collection Time    10/09/12  8:20 AM      Result Value Range   Sodium 138  136 - 145 mEq/L   Potassium 4.3  3.5 - 5.1 mEq/L   Chloride 109  98 - 109 mEq/L   CO2 20 (*) 22 - 29 mEq/L   Glucose 117  70 - 140 mg/dl   BUN 40.9 (*) 7.0 - 81.1 mg/dL   Creatinine 2.6 (*) 0.6 - 1.1 mg/dL   Total Bilirubin <9.14 Repeated and Verified  0.20 - 1.20 mg/dL   Alkaline Phosphatase 50  40 - 150 U/L   AST 19  5 - 34 U/L   ALT 12  0 - 55 U/L   Total Protein 6.7  6.4 - 8.3 g/dL   Albumin 2.9 (*) 3.5 - 5.0 g/dL   Calcium 9.0  8.4 - 78.2 mg/dL  COMPREHENSIVE METABOLIC PANEL (CC13)     Status: Abnormal   Collection Time    10/22/12  8:23 AM      Result Value Range   Sodium 137  136 - 145 mEq/L   Potassium 4.2  3.5 - 5.1 mEq/L   Chloride 109  98 - 109 mEq/L   CO2 17 (*) 22 - 29 mEq/L   Glucose 120  70 - 140 mg/dl   BUN 95.6 (*) 7.0 - 21.3 mg/dL   Creatinine 2.1 (*) 0.6 - 1.1 mg/dL   Total Bilirubin 0.86  0.20 - 1.20 mg/dL   Alkaline Phosphatase 77  40 - 150 U/L   AST 24  5 - 34 U/L   ALT 15  0 - 55 U/L   Total Protein 6.3 (*) 6.4 - 8.3 g/dL   Albumin 2.9 (*) 3.5 - 5.0 g/dL   Calcium 8.6  8.4 - 57.8 mg/dL  CBC WITH DIFFERENTIAL     Status: Abnormal   Collection Time    10/22/12  8:23 AM      Result Value Range   WBC 10.5 (*) 3.9 - 10.3 10e3/uL   NEUT# 8.4 (*) 1.5 - 6.5 10e3/uL   HGB 8.3 (*) 11.6 - 15.9 g/dL   HCT 46.9 (*) 62.9 - 52.8 %   Platelets 346  145 - 400 10e3/uL   MCV 89.0  79.5 - 101.0 fL   MCH 29.9  25.1 - 34.0 pg   MCHC 33.6  31.5 - 36.0 g/dL   RBC 4.13 (*) 2.44 - 0.10 10e6/uL   RDW 16.2 (*) 11.2 - 14.5 %   lymph# 1.3  0.9 - 3.3 10e3/uL   MONO# 0.6  0.1 - 0.9 10e3/uL   Eosinophils Absolute 0.2  0.0 - 0.5 10e3/uL   Basophils  Absolute 0.1  0.0 - 0.1 10e3/uL   NEUT% 79.7 (*) 38.4 - 76.8 %   LYMPH% 12.6 (*) 14.0 - 49.7 %   MONO% 5.4  0.0 - 14.0 %   EOS% 1.8  0.0 - 7.0 %   BASO% 0.5  0.0 - 2.0 %   nRBC 3 (*) 0 - 0 %  CEA     Status: Abnormal   Collection Time    10/22/12  8:23 AM      Result Value Range   CEA 22.1 (*) 0.0 - 5.0 ng/mL  UA PROTEIN, DIPSTICK - [facility]     Status: None   Collection Time    10/22/12  8:23 AM      Result Value Range   Protein, ur 30  Negative- <30 mg/dL  CBC WITH DIFFERENTIAL     Status: Abnormal   Collection Time    11/06/12  8:46 AM      Result Value Range   WBC 19.6 (*) 3.9 - 10.3 10e3/uL   NEUT# 16.3 (*) 1.5 - 6.5 10e3/uL   HGB 8.3 (*) 11.6 - 15.9 g/dL   HCT 16.1 (*) 09.6 - 04.5 %   Platelets 400  145 - 400 10e3/uL   MCV 87.4  79.5 - 101.0 fL   MCH 28.2  25.1 - 34.0 pg   MCHC 32.3  31.5 - 36.0 g/dL   RBC 4.09 (*) 8.11 - 9.14 10e6/uL   RDW 17.2 (*) 11.2 - 14.5 %   lymph# 0.8 (*) 0.9 - 3.3 10e3/uL   MONO# 2.0 (*) 0.1 - 0.9 10e3/uL   Eosinophils Absolute 0.3  0.0 - 0.5 10e3/uL   Basophils Absolute 0.2 (*) 0.0 - 0.1 10e3/uL   NEUT% 83.2 (*) 38.4 - 76.8 %   LYMPH% 4.1 (*) 14.0 - 49.7 %   MONO% 10.2  0.0 - 14.0 %   EOS% 1.6  0.0 - 7.0 %   BASO% 0.9  0.0 - 2.0 %   nRBC 2 (*) 0 - 0 %  CEA     Status: Abnormal   Collection Time    11/06/12  8:46 AM      Result Value Range   CEA 15.0 (*) 0.0 - 5.0 ng/mL  COMPREHENSIVE METABOLIC PANEL (CC13)     Status: Abnormal   Collection Time    11/06/12  8:46 AM      Result Value Range   Sodium 134 (*) 136 - 145 mEq/L   Potassium 4.1  3.5 - 5.1 mEq/L   Chloride 105  98 - 109 mEq/L   CO2 18 (*) 22 - 29 mEq/L   Glucose 107  70 - 140 mg/dl   BUN 78.2 (*) 7.0 - 95.6 mg/dL   Creatinine 4.3 Repeated and Verified (*) 0.6 - 1.1 mg/dL   Total Bilirubin 2.13  0.20 - 1.20 mg/dL   Alkaline Phosphatase 86  40 - 150 U/L   AST 20  5 - 34 U/L   ALT 10  0 - 55 U/L   Total Protein 6.5  6.4 - 8.3 g/dL   Albumin 2.8 (*) 3.5 - 5.0 g/dL    Calcium 9.3  8.4 - 08.6 mg/dL  FERRITIN [facility]     Status: None   Collection Time    11/06/12  1:44 PM      Result Value Range   Ferritin 252  9 - 269 ng/ml  IRON AND TIBC [facility]  Status: Abnormal   Collection Time    11/06/12  1:44 PM      Result Value Range   Iron 1,248 Result Confirmed by Manual Dilution. (*) 41 - 142 ug/dL   TIBC 469  629 - 528 ug/dL   UIBC Unable to calculate  120 - 384 ug/dL   %SAT Unable to determine  21 - 57 %  CBC WITH DIFFERENTIAL     Status: Abnormal   Collection Time    11/20/12  9:02 AM      Result Value Range   WBC 9.1  3.9 - 10.3 10e3/uL   NEUT# 7.2 (*) 1.5 - 6.5 10e3/uL   HGB 9.9 (*) 11.6 - 15.9 g/dL   HCT 41.3 (*) 24.4 - 01.0 %   Platelets 458 (*) 145 - 400 10e3/uL   MCV 90.7  79.5 - 101.0 fL   MCH 30.0  25.1 - 34.0 pg   MCHC 33.1  31.5 - 36.0 g/dL   RBC 2.72 (*) 5.36 - 6.44 10e6/uL   RDW 19.4 (*) 11.2 - 14.5 %   lymph# 0.7 (*) 0.9 - 3.3 10e3/uL   MONO# 1.0 (*) 0.1 - 0.9 10e3/uL   Eosinophils Absolute 0.1  0.0 - 0.5 10e3/uL   Basophils Absolute 0.1  0.0 - 0.1 10e3/uL   NEUT% 79.0 (*) 38.4 - 76.8 %   LYMPH% 7.6 (*) 14.0 - 49.7 %   MONO% 11.3  0.0 - 14.0 %   EOS% 1.0  0.0 - 7.0 %   BASO% 1.1  0.0 - 2.0 %  COMPREHENSIVE METABOLIC PANEL (CC13)     Status: Abnormal   Collection Time    11/20/12  9:03 AM      Result Value Range   Sodium 140  136 - 145 mEq/L   Potassium 4.6  3.5 - 5.1 mEq/L   Chloride 106  98 - 109 mEq/L   CO2 19 (*) 22 - 29 mEq/L   Glucose 139  70 - 140 mg/dl   BUN 03.4  7.0 - 74.2 mg/dL   Creatinine 1.9 (*) 0.6 - 1.1 mg/dL   Total Bilirubin 5.95  0.20 - 1.20 mg/dL   Alkaline Phosphatase 60  40 - 150 U/L   AST 17  5 - 34 U/L   ALT 11  0 - 55 U/L   Total Protein 6.9  6.4 - 8.3 g/dL   Albumin 2.9 (*) 3.5 - 5.0 g/dL   Calcium 9.4  8.4 - 63.8 mg/dL  CEA     Status: Abnormal   Collection Time    11/20/12  9:03 AM      Result Value Range   CEA 14.0 (*) 0.0 - 5.0 ng/mL  CBC WITH DIFFERENTIAL     Status: Abnormal    Collection Time    12/18/12  8:12 AM      Result Value Range   WBC 6.5  3.9 - 10.3 10e3/uL   NEUT# 4.6  1.5 - 6.5 10e3/uL   HGB 11.5 (*) 11.6 - 15.9 g/dL   HCT 75.6  43.3 - 29.5 %   Platelets 266  145 - 400 10e3/uL   MCV 93.9  79.5 - 101.0 fL   MCH 30.3  25.1 - 34.0 pg   MCHC 32.3  31.5 - 36.0 g/dL   RBC 1.88  4.16 - 6.06 10e6/uL   RDW 19.1 (*) 11.2 - 14.5 %   lymph# 0.8 (*) 0.9 - 3.3 10e3/uL   MONO# 0.9  0.1 - 0.9  10e3/uL   Eosinophils Absolute 0.2  0.0 - 0.5 10e3/uL   Basophils Absolute 0.0  0.0 - 0.1 10e3/uL   NEUT% 71.1  38.4 - 76.8 %   LYMPH% 11.9 (*) 14.0 - 49.7 %   MONO% 14.1 (*) 0.0 - 14.0 %   EOS% 2.3  0.0 - 7.0 %   BASO% 0.6  0.0 - 2.0 %  COMPREHENSIVE METABOLIC PANEL (CC13)     Status: Abnormal   Collection Time    12/18/12  8:12 AM      Result Value Range   Sodium 140  136 - 145 mEq/L   Potassium 4.0  3.5 - 5.1 mEq/L   Chloride 104  98 - 109 mEq/L   CO2 27  22 - 29 mEq/L   Glucose 104  70 - 140 mg/dl   BUN 16.1  7.0 - 09.6 mg/dL   Creatinine 1.4 (*) 0.6 - 1.1 mg/dL   Total Bilirubin 0.45  0.20 - 1.20 mg/dL   Alkaline Phosphatase 71  40 - 150 U/L   AST 20  5 - 34 U/L   ALT 9  0 - 55 U/L   Total Protein 7.0  6.4 - 8.3 g/dL   Albumin 3.3 (*) 3.5 - 5.0 g/dL   Calcium 9.5  8.4 - 40.9 mg/dL  CEA     Status: Abnormal   Collection Time    12/18/12  8:12 AM      Result Value Range   CEA 8.6 (*) 0.0 - 5.0 ng/mL  APTT     Status: None   Collection Time    12/21/12 11:48 AM      Result Value Range   aPTT 28  24 - 37 seconds  PROTIME-INR     Status: None   Collection Time    12/21/12 11:48 AM      Result Value Range   Prothrombin Time 12.4  11.6 - 15.2 seconds   INR 0.94  0.00 - 1.49   Assessment/Plan Rectal cancer with metastasis to liver. Left port a catheter in place and malpositioned against SVC wall, CV line injection 8/20 History of right IJ port catheter placement and removal secondary to no longer needed at that time. Scheduled for left IJ port  catheter removal and placement of new port a catheter. Labs reviewed, patient has been NPO. Risks and Benefits discussed with the patient. All of the patient's questions were answered, patient is agreeable to proceed. Consent signed and in chart.   Pattricia Boss D PA-C 12/21/2012, 12:16 PM

## 2012-12-21 NOTE — H&P (Signed)
Agree 

## 2012-12-21 NOTE — Procedures (Signed)
Procedure:  Placement of right IJ port and removal of left port Findings:  New SL PAC via right IJ vein.  Cath tip at cavoatrial junction.  OK to use.  No PTX. Left chest PAC removed.

## 2013-01-01 ENCOUNTER — Ambulatory Visit (HOSPITAL_BASED_OUTPATIENT_CLINIC_OR_DEPARTMENT_OTHER): Payer: Medicare Other | Admitting: Oncology

## 2013-01-01 ENCOUNTER — Telehealth: Payer: Self-pay | Admitting: Oncology

## 2013-01-01 ENCOUNTER — Other Ambulatory Visit: Payer: Medicare Other | Admitting: Lab

## 2013-01-01 ENCOUNTER — Telehealth: Payer: Self-pay | Admitting: *Deleted

## 2013-01-01 ENCOUNTER — Ambulatory Visit (HOSPITAL_BASED_OUTPATIENT_CLINIC_OR_DEPARTMENT_OTHER): Payer: Medicare Other

## 2013-01-01 VITALS — BP 147/82 | HR 81 | Temp 97.7°F | Resp 18 | Ht 64.0 in | Wt 175.0 lb

## 2013-01-01 DIAGNOSIS — D638 Anemia in other chronic diseases classified elsewhere: Secondary | ICD-10-CM

## 2013-01-01 DIAGNOSIS — C20 Malignant neoplasm of rectum: Secondary | ICD-10-CM

## 2013-01-01 DIAGNOSIS — C787 Secondary malignant neoplasm of liver and intrahepatic bile duct: Secondary | ICD-10-CM

## 2013-01-01 DIAGNOSIS — Z452 Encounter for adjustment and management of vascular access device: Secondary | ICD-10-CM

## 2013-01-01 DIAGNOSIS — Z5112 Encounter for antineoplastic immunotherapy: Secondary | ICD-10-CM

## 2013-01-01 DIAGNOSIS — N289 Disorder of kidney and ureter, unspecified: Secondary | ICD-10-CM

## 2013-01-01 DIAGNOSIS — Z5111 Encounter for antineoplastic chemotherapy: Secondary | ICD-10-CM

## 2013-01-01 LAB — COMPREHENSIVE METABOLIC PANEL (CC13)
AST: 18 U/L (ref 5–34)
Albumin: 3.4 g/dL — ABNORMAL LOW (ref 3.5–5.0)
Alkaline Phosphatase: 66 U/L (ref 40–150)
BUN: 19.4 mg/dL (ref 7.0–26.0)
CO2: 24 mEq/L (ref 22–29)
Creatinine: 1.7 mg/dL — ABNORMAL HIGH (ref 0.6–1.1)
Glucose: 97 mg/dl (ref 70–140)
Potassium: 4 mEq/L (ref 3.5–5.1)
Total Bilirubin: 0.26 mg/dL (ref 0.20–1.20)

## 2013-01-01 LAB — CBC WITH DIFFERENTIAL/PLATELET
Basophils Absolute: 0 10*3/uL (ref 0.0–0.1)
Eosinophils Absolute: 0.4 10*3/uL (ref 0.0–0.5)
HGB: 12.5 g/dL (ref 11.6–15.9)
MCV: 93.6 fL (ref 79.5–101.0)
MONO#: 0.5 10*3/uL (ref 0.1–0.9)
MONO%: 6.9 % (ref 0.0–14.0)
NEUT#: 4.7 10*3/uL (ref 1.5–6.5)
Platelets: 409 10*3/uL — ABNORMAL HIGH (ref 145–400)
RDW: 16.8 % — ABNORMAL HIGH (ref 11.2–14.5)
WBC: 6.7 10*3/uL (ref 3.9–10.3)

## 2013-01-01 MED ORDER — SODIUM CHLORIDE 0.9 % IV SOLN
Freq: Once | INTRAVENOUS | Status: AC
  Administered 2013-01-01: 12:00:00 via INTRAVENOUS

## 2013-01-01 MED ORDER — FLUOROURACIL CHEMO INJECTION 2.5 GM/50ML
400.0000 mg/m2 | Freq: Once | INTRAVENOUS | Status: AC
  Administered 2013-01-01: 800 mg via INTRAVENOUS
  Filled 2013-01-01: qty 16

## 2013-01-01 MED ORDER — ATROPINE SULFATE 1 MG/ML IJ SOLN
0.5000 mg | Freq: Once | INTRAMUSCULAR | Status: AC | PRN
  Administered 2013-01-01: 0.5 mg via INTRAVENOUS

## 2013-01-01 MED ORDER — DEXAMETHASONE SODIUM PHOSPHATE 20 MG/5ML IJ SOLN
20.0000 mg | Freq: Once | INTRAMUSCULAR | Status: AC
  Administered 2013-01-01: 20 mg via INTRAVENOUS

## 2013-01-01 MED ORDER — IRINOTECAN HCL CHEMO INJECTION 100 MG/5ML
125.0000 mg/m2 | Freq: Once | INTRAVENOUS | Status: AC
  Administered 2013-01-01: 248 mg via INTRAVENOUS
  Filled 2013-01-01: qty 12.4

## 2013-01-01 MED ORDER — SODIUM CHLORIDE 0.9 % IV SOLN
5.0000 mg/kg | Freq: Once | INTRAVENOUS | Status: AC
  Administered 2013-01-01: 425 mg via INTRAVENOUS
  Filled 2013-01-01: qty 17

## 2013-01-01 MED ORDER — DEXAMETHASONE SODIUM PHOSPHATE 20 MG/5ML IJ SOLN
INTRAMUSCULAR | Status: AC
  Filled 2013-01-01: qty 5

## 2013-01-01 MED ORDER — SODIUM CHLORIDE 0.9 % IV SOLN
2400.0000 mg/m2 | INTRAVENOUS | Status: DC
  Administered 2013-01-01: 4750 mg via INTRAVENOUS
  Filled 2013-01-01: qty 95

## 2013-01-01 MED ORDER — ATROPINE SULFATE 1 MG/ML IJ SOLN
INTRAMUSCULAR | Status: AC
  Filled 2013-01-01: qty 1

## 2013-01-01 MED ORDER — LEUCOVORIN CALCIUM INJECTION 350 MG
400.0000 mg/m2 | Freq: Once | INTRAVENOUS | Status: AC
  Administered 2013-01-01: 792 mg via INTRAVENOUS
  Filled 2013-01-01: qty 39.6

## 2013-01-01 MED ORDER — ONDANSETRON 16 MG/50ML IVPB (CHCC)
16.0000 mg | Freq: Once | INTRAVENOUS | Status: AC
  Administered 2013-01-01: 16 mg via INTRAVENOUS

## 2013-01-01 MED ORDER — ONDANSETRON 16 MG/50ML IVPB (CHCC)
INTRAVENOUS | Status: AC
  Filled 2013-01-01: qty 16

## 2013-01-01 MED ORDER — ALTEPLASE 2 MG IJ SOLR
2.0000 mg | Freq: Once | INTRAMUSCULAR | Status: AC | PRN
  Administered 2013-01-01: 2 mg
  Filled 2013-01-01: qty 2

## 2013-01-01 NOTE — Progress Notes (Signed)
Right port accessed but no blood return. Port flushed and patient repositioned. Patient reaccessed but still no blood return. Cath flo placed at 1056. 1135 positive blood return, cath flo removed.

## 2013-01-01 NOTE — Progress Notes (Signed)
Hematology and Oncology Follow Up Visit  Ashley Davenport [id number] 09-Mar-1931 79 y.o. 01/01/2013 9:54 AM [name], MDClark, Lindell Spar, MD   Principle Diagnosis: 79 year old woman with rectal cancer and anemia of chronic disease secondary to kidney disease. She was diagnosed with rectal cancer on  09/01/2006.  She hadT3 N0 moderately differentiated adenocarcinoma of the rectum with evidence of LV invasion. Now she has stage IV disease with liver mets.    Prior Therapy: She is status post neoadjuvant continuous infusion 5-FU with external radiation therapy between July 10, 2006 and Sep 01, 2006.  She declined abdominoperineal resection. She went on to receive additional 3 cycles of Xeloda, but developed hand-foot syndrome and declined further therapy.   Current therapy: She is on FOLFIRI + Avastin. First cycle given on 6/27 with FOLFIRI alone. Cycle 4 was delayed due to grade 2 fatigue and grade 2-3 diarrhea. She is here to resume cycle 5 of chemo with dose reduction of her Irinotecan to 125 mg/m2. Chemotherapy has been delayed for 2 weeks due to Port-A-Cath placement.  Interim History: Ashley Davenport presents today for a follow up visit. She is a very nice women with the above diagnosis. Fatigue is better today. Remains independent in ADLs. Appetite is improving and weight is stable. Besides pain secondary to neuralgia from shingles, she has no abnormal discomfort/pain. Diarrhea is better. No nausea or vomiting. No chest pain, shortness of breath, or dyspnea. No new complications from chemotherapy. She felt better with 4 weeks off treatments. Her port was replaced without any complications and now she is ready to proceed. She feels a lot better with 4 week off but she understands that it's not ideal and we will like to stick to the schedule such as possible. Her appetite still rather poor and is able to eat and maintain her weight.    Medications: I have reviewed the patient's current  medications.  Current Outpatient Prescriptions  Medication Sig Dispense Refill  . allopurinol (ZYLOPRIM) 150 mg TABS Take 150 mg by mouth every morning.       . Biotin 5000 MCG CAPS Take 5,000 mcg by mouth every morning.       . Coenzyme Q10 (COQ-10) 200 MG CAPS Take 200 mg by mouth at bedtime.       . furosemide (LASIX) 20 MG tablet Take 20 mg by mouth every morning.       Marland Kitchen levothyroxine (SYNTHROID, LEVOTHROID) 75 MCG tablet Take 75 mcg by mouth every morning.       . lidocaine-prilocaine (EMLA) cream Apply topically as needed. Apply to port before chemotherapy  30 g  0  . ondansetron (ZOFRAN) 8 MG tablet Take 1 tablet (8 mg total) by mouth every 8 (eight) hours as needed for nausea.  20 tablet  2  . pravastatin (PRAVACHOL) 20 MG tablet Take 20 mg by mouth at bedtime.       . valsartan (DIOVAN) 160 MG tablet Take 160 mg by mouth every morning.       Marland Kitchen amLODipine (NORVASC) 5 MG tablet Take 5 mg by mouth daily.      . ferrous sulfate 325 (65 FE) MG tablet Take 325 mg by mouth daily with breakfast.       No current facility-administered medications for this visit.   Facility-Administered Medications Ordered in Other Visits  Medication Dose Route Frequency Provider Last Rate Last Dose  . 0.9 %  sodium chloride infusion   Intravenous Once Benjiman Core, MD  Allergies:  Allergies  Allergen Reactions  . Shellfish Allergy Itching and Rash    Feels itching internally.  . Meperidine Hcl     REACTION: Hypotension  . Rosuvastatin Other (See Comments)    "Makes my muscles ache and weak in the legs"  . Tramadol Hcl     REACTION: Tongue and lips swell    Past Medical History, Surgical history, Social history, and Family History were reviewed and updated.  Review of Systems:  Remaining ROS negative.  Physical Exam: Blood pressure 147/82, pulse 81, temperature 97.7 F (36.5 C), temperature source Oral, resp. rate 18, height 5\' 4"  (1.626 m), weight 175 lb (79.379 kg). ECOG:  1 General appearance: alert Head: Normocephalic, without obvious abnormality, atraumatic Neck: no adenopathy, no carotid bruit, no JVD, supple, symmetrical, trachea midline and thyroid not enlarged, symmetric, no tenderness/mass/nodules Lymph nodes: Cervical, supraclavicular, and axillary nodes normal. Heart:regular rate and rhythm, S1, S2 normal, no murmur, click, rub or gallop Lung:chest clear, no wheezing, rales, normal symmetric air entry Abdomen: soft, non-tender, without masses or organomegaly EXT:no erythema, induration, or nodules   Lab Results: Lab Results  Component Value Date   WBC 6.5 12/18/2012   HGB 11.5* 12/18/2012   HCT 35.6 12/18/2012   MCV 93.9 12/18/2012   PLT 266 12/18/2012     Chemistry      Component Value Date/Time   NA 140 12/18/2012 0812   NA 138 09/18/2012 1355   K 4.0 12/18/2012 0812   K 5.5* 09/18/2012 1355   CL 102 09/25/2012 0933   CL 103 09/18/2012 1355   CO2 27 12/18/2012 0812   CO2 24 09/18/2012 1355   BUN 22.9 12/18/2012 0812   BUN 38* 09/18/2012 1355   CREATININE 1.4* 12/18/2012 0812   CREATININE 2.23* 09/18/2012 1355      Component Value Date/Time   CALCIUM 9.5 12/18/2012 0812   CALCIUM 9.7 09/18/2012 1355   ALKPHOS 71 12/18/2012 0812   ALKPHOS 52 08/30/2011 1107   AST 20 12/18/2012 0812   AST 21 08/30/2011 1107   ALT 9 12/18/2012 0812   ALT 18 08/30/2011 1107   BILITOT 0.45 12/18/2012 0812   BILITOT 0.3 08/30/2011 1107     Imaging:   Impression and Plan:  [name] is a 79 year old woman with:  1. T3 N0 moderately differentiated adenocarcinoma of the rectum with evidence of LV invasion and positive surgical margins 05/30/2006. She is status post neoadjuvant continuous infusion 5-FU with external radiation therapy between July 10, 2006 and Sep 01, 2006. Now has stage IV disease and S/P 4 cycles of FOLFIRI with Avastin added with cycle 2. She developed grade 2-3 diarrhea, grade 2 fatigue, and weight loss. CT scan from 11/2012 showed decrease in size  of the dominant hepatic mets. Recommend that she proceed with cycle 5 today with CPT-11 dose reduction (125 mg/m2).    2. Anemia: S/P Feraheme. Anemia and fatigue improving.   3. Neutropenia: Receives Neulasta with each cycle of chemotherapy.  4. nausea and vomiting prophylaxis: On Zofran and compazine.   5. HTN: She is on amlodipine and valsartan. She has been holding her BP meds due to being normotensive without them.  6. Follow up: 01/15/2013 for cycle 6. 10/30 for cycle 7.     Janal Haak 9/30/20149:54 AM

## 2013-01-01 NOTE — Telephone Encounter (Signed)
Gave pt appt for l;ab ,Md,injection, and DC pump, for october emailed [place] regarding chemo

## 2013-01-01 NOTE — Patient Instructions (Addendum)
[facility] Discharge Instructions for Patients Receiving Chemotherapy  Today you received the following chemotherapy agents Irinotecan/Leucovorin/5FU.  To help prevent nausea and vomiting after your treatment, we encourage you to take your nausea medication as prescribed.  If you develop nausea and vomiting that is not controlled by your nausea medication, call the clinic.   BELOW ARE SYMPTOMS THAT SHOULD BE REPORTED IMMEDIATELY:  *FEVER GREATER THAN 100.5 F  *CHILLS WITH OR WITHOUT FEVER  NAUSEA AND VOMITING THAT IS NOT CONTROLLED WITH YOUR NAUSEA MEDICATION  *UNUSUAL SHORTNESS OF BREATH  *UNUSUAL BRUISING OR BLEEDING  TENDERNESS IN MOUTH AND THROAT WITH OR WITHOUT PRESENCE OF ULCERS  *URINARY PROBLEMS  *BOWEL PROBLEMS  UNUSUAL RASH Items with * indicate a potential emergency and should be followed up as soon as possible.  Feel free to call the clinic you have any questions or concerns. The clinic phone number is [phone].    

## 2013-01-01 NOTE — Telephone Encounter (Signed)
Per staff message and POF I have scheduled appts.  JMW  

## 2013-01-01 NOTE — Telephone Encounter (Signed)
Talked to pt and gave her all chemo appts for October 2014

## 2013-01-02 ENCOUNTER — Ambulatory Visit: Payer: Medicare Other | Admitting: Oncology

## 2013-01-03 ENCOUNTER — Ambulatory Visit: Payer: Medicare Other

## 2013-01-03 ENCOUNTER — Ambulatory Visit (HOSPITAL_BASED_OUTPATIENT_CLINIC_OR_DEPARTMENT_OTHER): Payer: Medicare Other

## 2013-01-03 VITALS — BP 149/71 | HR 68 | Temp 98.0°F

## 2013-01-03 DIAGNOSIS — D709 Neutropenia, unspecified: Secondary | ICD-10-CM

## 2013-01-03 DIAGNOSIS — C20 Malignant neoplasm of rectum: Secondary | ICD-10-CM

## 2013-01-03 MED ORDER — SODIUM CHLORIDE 0.9 % IJ SOLN
10.0000 mL | INTRAMUSCULAR | Status: DC | PRN
  Administered 2013-01-03: 10 mL
  Filled 2013-01-03: qty 10

## 2013-01-03 MED ORDER — PEGFILGRASTIM INJECTION 6 MG/0.6ML
6.0000 mg | Freq: Once | SUBCUTANEOUS | Status: AC
  Administered 2013-01-03: 6 mg via SUBCUTANEOUS
  Filled 2013-01-03: qty 0.6

## 2013-01-03 MED ORDER — HEPARIN SOD (PORK) LOCK FLUSH 100 UNIT/ML IV SOLN
500.0000 [IU] | Freq: Once | INTRAVENOUS | Status: AC | PRN
  Administered 2013-01-03: 500 [IU]
  Filled 2013-01-03: qty 5

## 2013-01-03 NOTE — Patient Instructions (Addendum)

## 2013-01-07 ENCOUNTER — Other Ambulatory Visit: Payer: Self-pay | Admitting: Certified Registered Nurse Anesthetist

## 2013-01-15 ENCOUNTER — Other Ambulatory Visit (HOSPITAL_COMMUNITY): Payer: Self-pay | Admitting: Interventional Radiology

## 2013-01-15 ENCOUNTER — Ambulatory Visit (HOSPITAL_BASED_OUTPATIENT_CLINIC_OR_DEPARTMENT_OTHER): Payer: Medicare Other

## 2013-01-15 ENCOUNTER — Other Ambulatory Visit: Payer: Self-pay | Admitting: Oncology

## 2013-01-15 ENCOUNTER — Ambulatory Visit (HOSPITAL_BASED_OUTPATIENT_CLINIC_OR_DEPARTMENT_OTHER): Payer: Medicare Other | Admitting: Oncology

## 2013-01-15 ENCOUNTER — Encounter: Payer: Self-pay | Admitting: Oncology

## 2013-01-15 ENCOUNTER — Ambulatory Visit (HOSPITAL_BASED_OUTPATIENT_CLINIC_OR_DEPARTMENT_OTHER): Payer: Medicare Other | Admitting: Lab

## 2013-01-15 ENCOUNTER — Ambulatory Visit (HOSPITAL_COMMUNITY)
Admission: RE | Admit: 2013-01-15 | Discharge: 2013-01-15 | Disposition: A | Discharge status: Home / Self Care | Payer: Medicare Other | Source: Ambulatory Visit | Attending: Oncology | Admitting: Oncology

## 2013-01-15 VITALS — BP 149/83 | HR 77 | Temp 97.8°F | Resp 20 | Ht 64.0 in | Wt 174.9 lb

## 2013-01-15 DIAGNOSIS — C787 Secondary malignant neoplasm of liver and intrahepatic bile duct: Secondary | ICD-10-CM

## 2013-01-15 DIAGNOSIS — Z5112 Encounter for antineoplastic immunotherapy: Secondary | ICD-10-CM

## 2013-01-15 DIAGNOSIS — C20 Malignant neoplasm of rectum: Secondary | ICD-10-CM

## 2013-01-15 DIAGNOSIS — D638 Anemia in other chronic diseases classified elsewhere: Secondary | ICD-10-CM

## 2013-01-15 DIAGNOSIS — N289 Disorder of kidney and ureter, unspecified: Secondary | ICD-10-CM

## 2013-01-15 DIAGNOSIS — Y849 Medical procedure, unspecified as the cause of abnormal reaction of the patient, or of later complication, without mention of misadventure at the time of the procedure: Secondary | ICD-10-CM | POA: Insufficient documentation

## 2013-01-15 DIAGNOSIS — D709 Neutropenia, unspecified: Secondary | ICD-10-CM

## 2013-01-15 DIAGNOSIS — T82598A Other mechanical complication of other cardiac and vascular devices and implants, initial encounter: Secondary | ICD-10-CM | POA: Insufficient documentation

## 2013-01-15 DIAGNOSIS — Z5111 Encounter for antineoplastic chemotherapy: Secondary | ICD-10-CM

## 2013-01-15 DIAGNOSIS — I10 Essential (primary) hypertension: Secondary | ICD-10-CM

## 2013-01-15 LAB — CBC WITH DIFFERENTIAL/PLATELET
BASO%: 0.6 % (ref 0.0–2.0)
Basophils Absolute: 0 10*3/uL (ref 0.0–0.1)
EOS%: 5.4 % (ref 0.0–7.0)
Eosinophils Absolute: 0.4 10*3/uL (ref 0.0–0.5)
HCT: 37.5 % (ref 34.8–46.6)
HGB: 12.3 g/dL (ref 11.6–15.9)
LYMPH%: 19.1 % (ref 14.0–49.7)
MCH: 30.1 pg (ref 25.1–34.0)
MCHC: 32.8 g/dL (ref 31.5–36.0)
MCV: 91.9 fL (ref 79.5–101.0)
MONO#: 0.4 10*3/uL (ref 0.1–0.9)
MONO%: 5.5 % (ref 0.0–14.0)
NEUT#: 4.5 10*3/uL (ref 1.5–6.5)
NEUT%: 69.4 % (ref 38.4–76.8)
Platelets: 292 10*3/uL (ref 145–400)
RBC: 4.08 10*6/uL (ref 3.70–5.45)
RDW: 16.7 % — ABNORMAL HIGH (ref 11.2–14.5)
WBC: 6.5 10*3/uL (ref 3.9–10.3)
lymph#: 1.3 10*3/uL (ref 0.9–3.3)
nRBC: 0 % (ref 0–0)

## 2013-01-15 LAB — COMPREHENSIVE METABOLIC PANEL (CC13)
ALT: 10 U/L (ref 0–55)
Alkaline Phosphatase: 76 U/L (ref 40–150)
BUN: 16.8 mg/dL (ref 7.0–26.0)
Glucose: 104 mg/dl (ref 70–140)
Sodium: 140 mEq/L (ref 136–145)
Total Bilirubin: 0.28 mg/dL (ref 0.20–1.20)
Total Protein: 7.3 g/dL (ref 6.4–8.3)

## 2013-01-15 MED ORDER — SODIUM CHLORIDE 0.9 % IJ SOLN
10.0000 mL | INTRAMUSCULAR | Status: DC | PRN
  Filled 2013-01-15: qty 10

## 2013-01-15 MED ORDER — SODIUM CHLORIDE 0.9 % IV SOLN
5.0000 mg/kg | Freq: Once | INTRAVENOUS | Status: AC
  Administered 2013-01-15: 425 mg via INTRAVENOUS
  Filled 2013-01-15: qty 17

## 2013-01-15 MED ORDER — ONDANSETRON 16 MG/50ML IVPB (CHCC)
16.0000 mg | Freq: Once | INTRAVENOUS | Status: AC
  Administered 2013-01-15: 16 mg via INTRAVENOUS

## 2013-01-15 MED ORDER — IRINOTECAN HCL CHEMO INJECTION 100 MG/5ML
125.0000 mg/m2 | Freq: Once | INTRAVENOUS | Status: AC
  Administered 2013-01-15: 248 mg via INTRAVENOUS
  Filled 2013-01-15: qty 12.4

## 2013-01-15 MED ORDER — IOHEXOL 300 MG/ML  SOLN
10.0000 mL | Freq: Once | INTRAMUSCULAR | Status: AC | PRN
  Administered 2013-01-15: 15 mL

## 2013-01-15 MED ORDER — LEUCOVORIN CALCIUM INJECTION 350 MG
400.0000 mg/m2 | Freq: Once | INTRAMUSCULAR | Status: AC
  Administered 2013-01-15: 792 mg via INTRAVENOUS
  Filled 2013-01-15: qty 39.6

## 2013-01-15 MED ORDER — ONDANSETRON 16 MG/50ML IVPB (CHCC)
INTRAVENOUS | Status: AC
  Filled 2013-01-15: qty 16

## 2013-01-15 MED ORDER — FLUOROURACIL CHEMO INJECTION 5 GM/100ML
2400.0000 mg/m2 | INTRAVENOUS | Status: DC
  Filled 2013-01-15: qty 95

## 2013-01-15 MED ORDER — FLUOROURACIL CHEMO INJECTION 2.5 GM/50ML
400.0000 mg/m2 | Freq: Once | INTRAVENOUS | Status: AC
  Administered 2013-01-15: 400 mg via INTRAVENOUS
  Filled 2013-01-15: qty 16

## 2013-01-15 MED ORDER — SODIUM CHLORIDE 0.9 % IV SOLN: Freq: Once | INTRAVENOUS | Status: DC

## 2013-01-15 MED ORDER — ATROPINE SULFATE 1 MG/ML IJ SOLN
INTRAMUSCULAR | Status: AC
  Filled 2013-01-15: qty 1

## 2013-01-15 MED ORDER — ATROPINE SULFATE 1 MG/ML IJ SOLN
0.5000 mg | Freq: Once | INTRAMUSCULAR | Status: AC | PRN
  Administered 2013-01-15: 0.5 mg via INTRAVENOUS

## 2013-01-15 MED ORDER — DEXAMETHASONE SODIUM PHOSPHATE 20 MG/5ML IJ SOLN
INTRAMUSCULAR | Status: AC
  Filled 2013-01-15: qty 5

## 2013-01-15 MED ORDER — DEXAMETHASONE SODIUM PHOSPHATE 20 MG/5ML IJ SOLN
20.0000 mg | Freq: Once | INTRAMUSCULAR | Status: AC
  Administered 2013-01-15: 20 mg via INTRAVENOUS

## 2013-01-15 MED ORDER — SODIUM CHLORIDE 0.9 % IV SOLN
Freq: Once | INTRAVENOUS | Status: AC
  Administered 2013-01-15: 12:00:00 via INTRAVENOUS

## 2013-01-15 NOTE — Patient Instructions (Signed)
[facility] Discharge Instructions for Patients Receiving Chemotherapy  Today you received the following chemotherapy agents: Avastin, 5FU, Leucovorin, Irinotecan  To help prevent nausea and vomiting after your treatment, we encourage you to take your nausea medication.   If you develop nausea and vomiting that is not controlled by your nausea medication, call the clinic.   BELOW ARE SYMPTOMS THAT SHOULD BE REPORTED IMMEDIATELY:  *FEVER GREATER THAN 100.5 F  *CHILLS WITH OR WITHOUT FEVER  NAUSEA AND VOMITING THAT IS NOT CONTROLLED WITH YOUR NAUSEA MEDICATION  *UNUSUAL SHORTNESS OF BREATH  *UNUSUAL BRUISING OR BLEEDING  TENDERNESS IN MOUTH AND THROAT WITH OR WITHOUT PRESENCE OF ULCERS  *URINARY PROBLEMS  *BOWEL PROBLEMS  UNUSUAL RASH Items with * indicate a potential emergency and should be followed up as soon as possible.  Feel free to call the clinic you have any questions or concerns. The clinic phone number is [phone].

## 2013-01-15 NOTE — Procedures (Signed)
Port injection demonstrates redundant catheter tubing coiled within the right supraclavicular fossa.   Contrast injection demonstrates persistent intravascular location of the catheter tip within the SVC, however there is a minimal amount of non-occlusive thrombus/fibrin sheath about the catheter tip. This port is no safe for continued medication administration or blood draws and will be revised next week.

## 2013-01-15 NOTE — Progress Notes (Signed)
Urine protein dipstick = 30. Pharmacy notified.  400mg  5FU push given before RN no longer able to get blood return from [facility]. Catheter visibly noticeable and palpable in upper neck at jugular vein site. Charge nurse Chrystie Nose) assessed site and unable to get blood return as well. PA notified Belenda Cruise Curcio) and order placed for dye study.

## 2013-01-15 NOTE — Progress Notes (Signed)
Hematology and Oncology Follow Up Visit  Ashley Davenport [id number] [birth year] 79 y.o. 01/15/2013 11:00 AM Ashley Davenport, MDClark, Ashley Spar, MD   Principle Diagnosis: 79 year old woman with rectal cancer and anemia of chronic disease secondary to kidney disease. She was diagnosed with rectal cancer on  09/01/2006.  She hadT3 N0 moderately differentiated adenocarcinoma of the rectum with evidence of LV invasion. Now she has stage IV disease with liver mets.    Prior Therapy: She is status post neoadjuvant continuous infusion 5-FU with external radiation therapy between July 10, 2006 and Sep 01, 2006.  She declined abdominoperineal resection. She went on to receive additional 3 cycles of Xeloda, but developed hand-foot syndrome and declined further therapy.   Current therapy: She is on FOLFIRI + Avastin. First cycle given on 6/27 with FOLFIRI alone. Cycle 4 was delayed due to grade 2 fatigue and grade 2-3 diarrhea. She is here for cycle 7 of chemo with dose reduction of her Irinotecan to 125 mg/m2.   Interim History: Ashley Davenport presents today for a follow up visit. She is a very nice women with the above diagnosis. Fatigue is better today. Remains independent in ADLs. Appetite is still decreased, but weight is stable. Besides pain secondary to neuralgia from shingles, she has no abnormal discomfort/pain. Diarrhea is better. No nausea or vomiting. No chest pain, shortness of breath, or dyspnea. No new complications from chemotherapy.    Medications: I have reviewed the patient's current medications.  Current Outpatient Prescriptions  Medication Sig Dispense Refill  . allopurinol (ZYLOPRIM) 150 mg TABS Take 150 mg by mouth every morning.       Marland Kitchen amLODipine (NORVASC) 5 MG tablet Take 5 mg by mouth daily.      . Biotin 5000 MCG CAPS Take 5,000 mcg by mouth every morning.       . Coenzyme Q10 (COQ-10) 200 MG CAPS Take 200 mg by mouth at bedtime.       . ferrous sulfate 325 (65 FE) MG  tablet Take 325 mg by mouth daily with breakfast.      . furosemide (LASIX) 20 MG tablet Take 20 mg by mouth every morning.       Marland Kitchen levothyroxine (SYNTHROID, LEVOTHROID) 75 MCG tablet Take 75 mcg by mouth every morning.       . lidocaine-prilocaine (EMLA) cream Apply topically as needed. Apply to port before chemotherapy  30 g  0  . ondansetron (ZOFRAN) 8 MG tablet Take 1 tablet (8 mg total) by mouth every 8 (eight) hours as needed for nausea.  20 tablet  2  . pravastatin (PRAVACHOL) 20 MG tablet Take 20 mg by mouth at bedtime.       . valsartan (DIOVAN) 160 MG tablet Take 160 mg by mouth every morning.        No current facility-administered medications for this visit.   Facility-Administered Medications Ordered in Other Visits  Medication Dose Route Frequency Provider Last Rate Last Dose  . 0.9 %  sodium chloride infusion   Intravenous Once Ashley Core, MD         Allergies:  Allergies  Allergen Reactions  . Shellfish Allergy Itching and Rash    Feels itching internally.  . Meperidine Hcl     REACTION: Hypotension  . Rosuvastatin Other (See Comments)    "Makes my muscles ache and weak in the legs"  . Tramadol Hcl     REACTION: Tongue and lips swell    Past Medical History,  Surgical history, Social history, and Family History were reviewed and updated.  Review of Systems:  Remaining ROS negative.  Physical Exam: Blood pressure 149/83, pulse 77, temperature 97.8 F (36.6 C), temperature source Oral, resp. rate 20, height 5\' 4"  (1.626 m), weight 174 lb 14.4 oz (79.334 kg). ECOG: 1 General appearance: alert Head: Normocephalic, without obvious abnormality, atraumatic Neck: no adenopathy, no carotid bruit, no JVD, supple, symmetrical, trachea midline and thyroid not enlarged, symmetric, no tenderness/mass/nodules Lymph nodes: Cervical, supraclavicular, and axillary nodes normal. Heart:regular rate and rhythm, S1, S2 normal, no murmur, click, rub or gallop Lung:chest clear,  no wheezing, rales, normal symmetric air entry Abdomen: soft, non-tender, without masses or organomegaly EXT:no erythema, induration, or nodules   Lab Results: Lab Results  Component Value Date   WBC 6.5 01/15/2013   HGB 12.3 01/15/2013   HCT 37.5 01/15/2013   MCV 91.9 01/15/2013   PLT 292 01/15/2013     Chemistry      Component Value Date/Time   NA 138 01/01/2013 0920   NA 138 09/18/2012 1355   K 4.0 01/01/2013 0920   K 5.5* 09/18/2012 1355   CL 102 09/25/2012 0933   CL 103 09/18/2012 1355   CO2 24 01/01/2013 0920   CO2 24 09/18/2012 1355   BUN 19.4 01/01/2013 0920   BUN 38* 09/18/2012 1355   CREATININE 1.7* 01/01/2013 0920   CREATININE 2.23* 09/18/2012 1355      Component Value Date/Time   CALCIUM 9.9 01/01/2013 0920   CALCIUM 9.7 09/18/2012 1355   ALKPHOS 66 01/01/2013 0920   ALKPHOS 52 08/30/2011 1107   AST 18 01/01/2013 0920   AST 21 08/30/2011 1107   ALT 8 01/01/2013 0920   ALT 18 08/30/2011 1107   BILITOT 0.26 01/01/2013 0920   BILITOT 0.3 08/30/2011 1107     Impression and Plan:  Ashley Davenport is a 79 year old woman with:  1. T3 N0 moderately differentiated adenocarcinoma of the rectum with evidence of LV invasion and positive surgical margins 05/30/2006. She is status post neoadjuvant continuous infusion 5-FU with external radiation therapy between July 10, 2006 and Sep 01, 2006. Now has stage IV disease and S/P 5 cycles of FOLFIRI with Avastin added with cycle 2. She developed grade 2-3 diarrhea, grade 2 fatigue, and weight loss. CT scan from 11/2012 showed decrease in size of the dominant hepatic mets. Recommend that she proceed with cycle 7 today. CPT-11 dose has already been reduced. No further dose reduction needed at this time.    2. Anemia: S/P Feraheme. Anemia and fatigue improving.   3. Neutropenia: Receives Neulasta with each cycle of chemotherapy.  4. nausea and vomiting prophylaxis: On Zofran and compazine.   5. HTN: She is on amlodipine and valsartan. She has  been holding her BP meds due to being normotensive without them.  6. Follow up: 10/30 for cycle 8.     Ashley Davenport 10/14/201411:00 AM

## 2013-01-15 NOTE — Progress Notes (Unsigned)
Call from Clenton Pare NP, dye study showed that pt's port has migrated and per Dr. Fredia Sorrow, it is not safe to use.  Belenda Cruise will have I.R. remove Huber needle and pt. will not need to return to [facility].  Above reported to primary nurses Terri Susann Givens and Angelena Form.  34fu chemo push watsed and 57fu home infusion returned to pharmacy. HL

## 2013-01-16 ENCOUNTER — Encounter (HOSPITAL_COMMUNITY): Payer: Self-pay | Admitting: Pharmacy Technician

## 2013-01-16 LAB — CEA: CEA: 4.9 ng/mL (ref 0.0–5.0)

## 2013-01-17 ENCOUNTER — Ambulatory Visit: Payer: Medicare Other

## 2013-01-17 ENCOUNTER — Other Ambulatory Visit: Payer: Self-pay | Admitting: Radiology

## 2013-01-25 ENCOUNTER — Inpatient Hospital Stay (HOSPITAL_COMMUNITY): Admission: RE | Admit: 2013-01-25 | Payer: Medicare Other | Source: Ambulatory Visit

## 2013-01-25 ENCOUNTER — Encounter (HOSPITAL_COMMUNITY): Payer: Self-pay

## 2013-01-25 ENCOUNTER — Other Ambulatory Visit (HOSPITAL_COMMUNITY): Payer: Medicare Other

## 2013-01-25 ENCOUNTER — Ambulatory Visit (HOSPITAL_COMMUNITY)
Admission: RE | Admit: 2013-01-25 | Discharge: 2013-01-25 | Disposition: A | Discharge status: Home / Self Care | Payer: Medicare Other | Source: Ambulatory Visit | Attending: Oncology | Admitting: Oncology

## 2013-01-25 ENCOUNTER — Other Ambulatory Visit: Payer: Self-pay | Admitting: Oncology

## 2013-01-25 DIAGNOSIS — E785 Hyperlipidemia, unspecified: Secondary | ICD-10-CM | POA: Insufficient documentation

## 2013-01-25 DIAGNOSIS — C20 Malignant neoplasm of rectum: Secondary | ICD-10-CM | POA: Insufficient documentation

## 2013-01-25 DIAGNOSIS — T82598A Other mechanical complication of other cardiac and vascular devices and implants, initial encounter: Secondary | ICD-10-CM | POA: Insufficient documentation

## 2013-01-25 DIAGNOSIS — Z79899 Other long term (current) drug therapy: Secondary | ICD-10-CM | POA: Insufficient documentation

## 2013-01-25 DIAGNOSIS — E039 Hypothyroidism, unspecified: Secondary | ICD-10-CM | POA: Insufficient documentation

## 2013-01-25 DIAGNOSIS — I10 Essential (primary) hypertension: Secondary | ICD-10-CM | POA: Insufficient documentation

## 2013-01-25 DIAGNOSIS — C801 Malignant (primary) neoplasm, unspecified: Secondary | ICD-10-CM | POA: Insufficient documentation

## 2013-01-25 DIAGNOSIS — Y831 Surgical operation with implant of artificial internal device as the cause of abnormal reaction of the patient, or of later complication, without mention of misadventure at the time of the procedure: Secondary | ICD-10-CM | POA: Insufficient documentation

## 2013-01-25 LAB — CBC WITH DIFFERENTIAL/PLATELET
Basophils Relative: 1 % (ref 0–1)
Eosinophils Relative: 3 % (ref 0–5)
Hemoglobin: 11.1 g/dL — ABNORMAL LOW (ref 12.0–15.0)
Lymphocytes Relative: 38 % (ref 12–46)
Lymphs Abs: 0.8 10*3/uL (ref 0.7–4.0)
MCV: 91.5 fL (ref 78.0–100.0)
Monocytes Relative: 22 % — ABNORMAL HIGH (ref 3–12)
Neutro Abs: 0.7 10*3/uL — ABNORMAL LOW (ref 1.7–7.7)
Neutrophils Relative %: 36 % — ABNORMAL LOW (ref 43–77)
RBC: 3.65 MIL/uL — ABNORMAL LOW (ref 3.87–5.11)

## 2013-01-25 LAB — APTT: aPTT: 31 seconds (ref 24–37)

## 2013-01-25 LAB — PROTIME-INR
INR: 0.95 (ref 0.00–1.49)
Prothrombin Time: 12.5 seconds (ref 11.6–15.2)

## 2013-01-25 MED ORDER — CEFAZOLIN SODIUM-DEXTROSE 2-3 GM-% IV SOLR
INTRAVENOUS | Status: AC
  Filled 2013-01-25: qty 50

## 2013-01-25 MED ORDER — FENTANYL CITRATE 0.05 MG/ML IJ SOLN
INTRAMUSCULAR | Status: AC
  Filled 2013-01-25: qty 6

## 2013-01-25 MED ORDER — MIDAZOLAM HCL 2 MG/2ML IJ SOLN
INTRAMUSCULAR | Status: AC
  Filled 2013-01-25: qty 2

## 2013-01-25 MED ORDER — CEFAZOLIN SODIUM-DEXTROSE 2-3 GM-% IV SOLR
2.0000 g | Freq: Once | INTRAVENOUS | Status: AC
  Administered 2013-01-25: 2 g via INTRAVENOUS

## 2013-01-25 MED ORDER — HEPARIN SOD (PORK) LOCK FLUSH 100 UNIT/ML IV SOLN
INTRAVENOUS | Status: AC
  Filled 2013-01-25: qty 5

## 2013-01-25 MED ORDER — SODIUM CHLORIDE 0.9 % IV SOLN
INTRAVENOUS | Status: DC
  Administered 2013-01-25: 08:00:00 via INTRAVENOUS

## 2013-01-25 MED ORDER — LIDOCAINE-EPINEPHRINE (PF) 2 %-1:200000 IJ SOLN
INTRAMUSCULAR | Status: AC
  Filled 2013-01-25: qty 20

## 2013-01-25 MED ORDER — FENTANYL CITRATE 0.05 MG/ML IJ SOLN
INTRAMUSCULAR | Status: AC | PRN
  Administered 2013-01-25 (×2): 100 ug via INTRAVENOUS

## 2013-01-25 MED ORDER — HEPARIN SOD (PORK) LOCK FLUSH 100 UNIT/ML IV SOLN
500.0000 [IU] | Freq: Once | INTRAVENOUS | Status: AC
  Administered 2013-01-25: 500 [IU] via INTRAVENOUS

## 2013-01-25 MED ORDER — MIDAZOLAM HCL 2 MG/2ML IJ SOLN
INTRAMUSCULAR | Status: AC | PRN
  Administered 2013-01-25 (×2): 2 mg via INTRAVENOUS

## 2013-01-25 NOTE — Procedures (Signed)
Successful removal of right anterior chest wall port-a-cath. Successful placement of right IJ approach port-a-cath with tip at the superior caval atrial junction. The catheter is ready for immediate use. No immediate post procedural complications.

## 2013-01-25 NOTE — H&P (Signed)
Ashley Davenport is an 79 y.o. female.   Chief Complaint: malpositioned/poorly functioning right chest port a cath HPI: Patient with history of metastatic rectal carcinoma and recent placement of a right chest wall port a cath/removal of poorly functioning left port. He presents today for right port revision secondary to poor function/ malpositioning/retraction within right supraclavicular fossa.  Past Medical History  Diagnosis Date  . AAA (abdominal aortic aneurysm) 2011  . Hyperlipidemia   . Hypertension   . Shingles     POST HERPATIC PAIN FROM SHINGLES  . Cancer     Rectal  . History of acute gouty arthritis   . Rectal cancer   . Kidney cysts   . Anemia     HX OF ANEMIA WITH CHEMO 2008  . Hypothyroidism     Past Surgical History  Procedure Laterality Date  . Abdominal hysterectomy  1961    total  . Tonsillectomy  1949  . Bunionectomy Bilateral   . Rectal tumor removed  2008    CANCEROUS  . Abdominal aorta aneurysm repair -stent  2011  . Portacath placement N/A 09/24/2012    Procedure: INSERTION PORT-A-CATH;  Surgeon: Almond Lint, MD;  Location: [facility];  Service: General;  Laterality: N/A;  . Abdominal aortic aneurysm repair      Family History  Problem Relation Age of Onset  . Hypertension Mother   . Heart disease Mother   . Heart attack Mother   . Hypertension Father   . Heart disease Father   . Diabetes Father   . Heart attack Father   . Cancer Sister     ovarian  . Diabetes Sister   . Hyperlipidemia Sister   . Hypertension Sister   . Cancer Cousin     breast  . Cancer Cousin     breast  . Diabetes Daughter   . Hyperlipidemia Daughter   . Hypertension Daughter    Social History:  reports that she quit smoking about 24 years ago. Her smoking use included Cigarettes. She has a 20 pack-year smoking history. She does not have any smokeless tobacco history on file. She reports that she drinks alcohol. She reports that she does not use illicit  drugs.  Allergies:  Allergies  Allergen Reactions  . Shellfish Allergy Itching and Rash    Feels itching internally.  . Meperidine Hcl     REACTION: Hypotension  . Rosuvastatin Other (See Comments)    "Makes my muscles ache and weak in the legs"  . Tramadol Hcl     REACTION: Tongue and lips swell    Current outpatient prescriptions:allopurinol (ZYLOPRIM) 150 mg TABS, Take 150 mg by mouth every morning. , Disp: , Rfl: ;  amLODipine (NORVASC) 5 MG tablet, Take 5 mg by mouth every morning. , Disp: , Rfl: ;  Biotin 5000 MCG CAPS, Take 5,000 mcg by mouth every morning. , Disp: , Rfl: ;  Coenzyme Q10 (COQ-10) 200 MG CAPS, Take 200 mg by mouth at bedtime. , Disp: , Rfl:  ferrous sulfate 325 (65 FE) MG tablet, Take 325 mg by mouth daily with breakfast., Disp: , Rfl: ;  furosemide (LASIX) 20 MG tablet, Take 20 mg by mouth every morning. , Disp: , Rfl: ;  levothyroxine (SYNTHROID, LEVOTHROID) 75 MCG tablet, Take 75 mcg by mouth every morning. , Disp: , Rfl: ;  ondansetron (ZOFRAN) 8 MG tablet, Take 1 tablet (8 mg total) by mouth every 8 (eight) hours as needed for nausea., Disp: 20 tablet, Rfl: 2  pravastatin (PRAVACHOL) 20 MG tablet, Take 20 mg by mouth at bedtime. , Disp: , Rfl: ;  PRESCRIPTION MEDICATION, She receives her treatments at the [facility] with Dr. Clelia Croft. She received Fluorouracil 800mg , Camptosar 248mg , Avastin 425mg  and Leucovorin 792mg  on 01/15/13 and an injection of Neulasta 6mg  on 01/07/13., Disp: , Rfl: ;  valsartan (DIOVAN) 160 MG tablet, Take 160 mg by mouth every morning. , Disp: , Rfl:  lidocaine-prilocaine (EMLA) cream, Apply 1 application topically daily as needed (Applies to port-a-cath.)., Disp: , Rfl:  Current facility-administered medications:0.9 %  sodium chloride infusion, , Intravenous, Continuous, Brayton El, PA-C, Last Rate: 20 mL/hr at 01/25/13 0816;  ceFAZolin (ANCEF) IVPB 2 g/50 mL premix, 2 g, Intravenous, Once, Brayton El,  PA-C Facility-Administered Medications Ordered in Other Encounters: 0.9 %  sodium chloride infusion, , Intravenous, Once, Benjiman Core, MD   Results for orders placed during the hospital encounter of 01/25/13 (from the past 48 hour(s))  APTT     Status: None   Collection Time    01/25/13  8:05 AM      Result Value Range   aPTT 31  24 - 37 seconds  CBC WITH DIFFERENTIAL     Status: Abnormal (Preliminary result)   Collection Time    01/25/13  8:05 AM      Result Value Range   WBC 2.1 (*) 4.0 - 10.5 K/uL   RBC 3.65 (*) 3.87 - 5.11 MIL/uL   Hemoglobin 11.1 (*) 12.0 - 15.0 g/dL   HCT 16.1 (*) 09.6 - 04.5 %   MCV 91.5  78.0 - 100.0 fL   MCH 30.4  26.0 - 34.0 pg   MCHC 33.2  30.0 - 36.0 g/dL   RDW 40.9 (*) 81.1 - 91.4 %   Platelets 284  150 - 400 K/uL   Neutrophils Relative % PENDING  43 - 77 %   Neutro Abs PENDING  1.7 - 7.7 K/uL   Band Neutrophils PENDING  0 - 10 %   Lymphocytes Relative PENDING  12 - 46 %   Lymphs Abs PENDING  0.7 - 4.0 K/uL   Monocytes Relative PENDING  3 - 12 %   Monocytes Absolute PENDING  0.1 - 1.0 K/uL   Eosinophils Relative PENDING  0 - 5 %   Eosinophils Absolute PENDING  0.0 - 0.7 K/uL   Basophils Relative PENDING  0 - 1 %   Basophils Absolute PENDING  0.0 - 0.1 K/uL   WBC Morphology PENDING     RBC Morphology PENDING     Smear Review PENDING     nRBC PENDING  0 /100 WBC   Metamyelocytes Relative PENDING     Myelocytes PENDING     Promyelocytes Absolute PENDING     Blasts PENDING    PROTIME-INR     Status: None   Collection Time    01/25/13  8:05 AM      Result Value Range   Prothrombin Time 12.5  11.6 - 15.2 seconds   INR 0.95  0.00 - 1.49   No results found.  Review of Systems  Constitutional: Negative for fever and chills.  Respiratory: Negative for cough and shortness of breath.   Cardiovascular: Negative for chest pain.  Gastrointestinal: Negative for nausea, vomiting and abdominal pain.  Musculoskeletal: Negative for back pain.   Neurological: Negative for headaches.  Endo/Heme/Allergies: Does not bruise/bleed easily.    Blood pressure 140/78, pulse 70, temperature 98 F (36.7 C), temperature source Oral,  resp. rate 18, SpO2 98.00%. Physical Exam  Constitutional: She appears well-developed and well-nourished.  Cardiovascular: Normal rate and regular rhythm.   Respiratory: Effort normal.  Sl dim BS left base, right clear; intact rt chest wall PAC, mildly tender to deep palpation  GI: Soft. Bowel sounds are normal. There is no tenderness.  Musculoskeletal: Normal range of motion. She exhibits no edema.     Assessment/Plan Pt with hx of metastatic rectal carcinoma, prior removal of poorly functioning left chest wall port a cath/placement of right port on 12/21/2012. Now with poorly functioning/malpositioned right port in need of revision. Details/risks of right chest wall port a cath revision d/w pt/family with their understanding and consent.  Ashley Davenport,D KEVIN 01/25/2013, 8:42 AM

## 2013-01-29 ENCOUNTER — Telehealth: Payer: Self-pay | Admitting: Oncology

## 2013-01-29 ENCOUNTER — Ambulatory Visit (HOSPITAL_BASED_OUTPATIENT_CLINIC_OR_DEPARTMENT_OTHER): Payer: Medicare Other | Admitting: Oncology

## 2013-01-29 ENCOUNTER — Ambulatory Visit (HOSPITAL_BASED_OUTPATIENT_CLINIC_OR_DEPARTMENT_OTHER): Payer: Medicare Other

## 2013-01-29 ENCOUNTER — Other Ambulatory Visit (HOSPITAL_BASED_OUTPATIENT_CLINIC_OR_DEPARTMENT_OTHER): Payer: Medicare Other | Admitting: Lab

## 2013-01-29 VITALS — BP 132/76 | HR 69 | Temp 98.0°F | Resp 20 | Ht 64.0 in | Wt 175.0 lb

## 2013-01-29 VITALS — BP 139/65 | HR 67 | Resp 19

## 2013-01-29 DIAGNOSIS — C787 Secondary malignant neoplasm of liver and intrahepatic bile duct: Secondary | ICD-10-CM

## 2013-01-29 DIAGNOSIS — Z5111 Encounter for antineoplastic chemotherapy: Secondary | ICD-10-CM

## 2013-01-29 DIAGNOSIS — C20 Malignant neoplasm of rectum: Secondary | ICD-10-CM

## 2013-01-29 DIAGNOSIS — D709 Neutropenia, unspecified: Secondary | ICD-10-CM

## 2013-01-29 DIAGNOSIS — Z5112 Encounter for antineoplastic immunotherapy: Secondary | ICD-10-CM

## 2013-01-29 DIAGNOSIS — D649 Anemia, unspecified: Secondary | ICD-10-CM

## 2013-01-29 DIAGNOSIS — I10 Essential (primary) hypertension: Secondary | ICD-10-CM

## 2013-01-29 LAB — CBC WITH DIFFERENTIAL/PLATELET
BASO%: 0.9 % (ref 0.0–2.0)
Eosinophils Absolute: 0.2 10*3/uL (ref 0.0–0.5)
HCT: 32.5 % — ABNORMAL LOW (ref 34.8–46.6)
LYMPH%: 19.4 % (ref 14.0–49.7)
MCH: 30.2 pg (ref 25.1–34.0)
MCHC: 32.6 g/dL (ref 31.5–36.0)
MCV: 92.6 fL (ref 79.5–101.0)
MONO#: 0.6 10*3/uL (ref 0.1–0.9)
MONO%: 12.4 % (ref 0.0–14.0)
NEUT%: 62.8 % (ref 38.4–76.8)
Platelets: 289 10*3/uL (ref 145–400)
RDW: 16.3 % — ABNORMAL HIGH (ref 11.2–14.5)
WBC: 4.4 10*3/uL (ref 3.9–10.3)

## 2013-01-29 LAB — COMPREHENSIVE METABOLIC PANEL (CC13)
Anion Gap: 10 mEq/L (ref 3–11)
BUN: 20 mg/dL (ref 7.0–26.0)
CO2: 21 mEq/L — ABNORMAL LOW (ref 22–29)
Calcium: 9.3 mg/dL (ref 8.4–10.4)
Chloride: 108 mEq/L (ref 98–109)
Creatinine: 1.8 mg/dL — ABNORMAL HIGH (ref 0.6–1.1)
Sodium: 138 mEq/L (ref 136–145)
Total Bilirubin: 0.27 mg/dL (ref 0.20–1.20)

## 2013-01-29 MED ORDER — DEXAMETHASONE SODIUM PHOSPHATE 20 MG/5ML IJ SOLN
INTRAMUSCULAR | Status: AC
  Filled 2013-01-29: qty 5

## 2013-01-29 MED ORDER — LEUCOVORIN CALCIUM INJECTION 350 MG
400.0000 mg/m2 | Freq: Once | INTRAVENOUS | Status: AC
  Administered 2013-01-29: 792 mg via INTRAVENOUS
  Filled 2013-01-29: qty 39.6

## 2013-01-29 MED ORDER — DEXAMETHASONE SODIUM PHOSPHATE 20 MG/5ML IJ SOLN
20.0000 mg | Freq: Once | INTRAMUSCULAR | Status: AC
  Administered 2013-01-29: 20 mg via INTRAVENOUS

## 2013-01-29 MED ORDER — IRINOTECAN HCL CHEMO INJECTION 100 MG/5ML
125.0000 mg/m2 | Freq: Once | INTRAVENOUS | Status: AC
  Administered 2013-01-29: 248 mg via INTRAVENOUS
  Filled 2013-01-29: qty 12.4

## 2013-01-29 MED ORDER — SODIUM CHLORIDE 0.9 % IV SOLN
Freq: Once | INTRAVENOUS | Status: AC
  Administered 2013-01-29: 10:00:00 via INTRAVENOUS

## 2013-01-29 MED ORDER — ONDANSETRON 16 MG/50ML IVPB (CHCC)
INTRAVENOUS | Status: AC
  Filled 2013-01-29: qty 16

## 2013-01-29 MED ORDER — SODIUM CHLORIDE 0.9 % IV SOLN
5.0000 mg/kg | Freq: Once | INTRAVENOUS | Status: AC
  Administered 2013-01-29: 425 mg via INTRAVENOUS
  Filled 2013-01-29: qty 17

## 2013-01-29 MED ORDER — ATROPINE SULFATE 1 MG/ML IJ SOLN
0.5000 mg | Freq: Once | INTRAMUSCULAR | Status: AC | PRN
  Administered 2013-01-29: 0.5 mg via INTRAVENOUS

## 2013-01-29 MED ORDER — SODIUM CHLORIDE 0.9 % IV SOLN
2400.0000 mg/m2 | INTRAVENOUS | Status: DC
  Administered 2013-01-29: 4750 mg via INTRAVENOUS
  Filled 2013-01-29: qty 95

## 2013-01-29 MED ORDER — FLUOROURACIL CHEMO INJECTION 2.5 GM/50ML
400.0000 mg/m2 | Freq: Once | INTRAVENOUS | Status: AC
  Administered 2013-01-29: 800 mg via INTRAVENOUS
  Filled 2013-01-29: qty 16

## 2013-01-29 MED ORDER — ONDANSETRON 16 MG/50ML IVPB (CHCC)
16.0000 mg | Freq: Once | INTRAVENOUS | Status: AC
  Administered 2013-01-29: 16 mg via INTRAVENOUS

## 2013-01-29 MED ORDER — ATROPINE SULFATE 1 MG/ML IJ SOLN
INTRAMUSCULAR | Status: AC
  Filled 2013-01-29: qty 1

## 2013-01-29 NOTE — Progress Notes (Signed)
Hematology and Oncology Follow Up Visit  Ashley Davenport [id number] July 14, 1930 79 y.o. 01/29/2013 8:34 AM [name], MDClark, Lindell Spar, MD   Principle Diagnosis: 79 year old woman with rectal cancer and anemia of chronic disease secondary to kidney disease. She was diagnosed with rectal cancer on  09/01/2006.  She hadT3 N0 moderately differentiated adenocarcinoma of the rectum with evidence of LV invasion. Now she has stage IV disease with liver mets.    Prior Therapy: She is status post neoadjuvant continuous infusion 5-FU with external radiation therapy between July 10, 2006 and Sep 01, 2006.  She declined abdominoperineal resection. She went on to receive additional 3 cycles of Xeloda, but developed hand-foot syndrome and declined further therapy.   Current therapy: She is on FOLFIRI + Avastin. First cycle given on 6/27 with FOLFIRI alone. Cycle 4 was delayed due to grade 2 fatigue and grade 2-3 diarrhea. She is here for cycle 8 of chemo with dose reduction of her Irinotecan to 125 mg/m2.   Interim History: Ashley Davenport presents today for a follow up visit. She is a very nice women with the above diagnosis. She is here for the next cycle of chemotherapy. Her last chemotherapy was abbreviated due to Port-A-Cath problems which have been fixed at this time. Fatigue is better today. Remains independent in ADLs. Appetite is still decreased, but weight is stable. Besides pain secondary to neuralgia from shingles, she has no abnormal discomfort/pain. Diarrhea is better. No nausea or vomiting. No chest pain, shortness of breath, or dyspnea. No new complications from chemotherapy. Feels a very well overall and close to her baseline level of activity.   Medications: I have reviewed the patient's current medications.  Current Outpatient Prescriptions  Medication Sig Dispense Refill  . allopurinol (ZYLOPRIM) 150 mg TABS Take 150 mg by mouth every morning.       Marland Kitchen amLODipine (NORVASC) 5 MG  tablet Take 5 mg by mouth every morning.       . Biotin 5000 MCG CAPS Take 5,000 mcg by mouth every morning.       . Coenzyme Q10 (COQ-10) 200 MG CAPS Take 200 mg by mouth at bedtime.       . ferrous sulfate 325 (65 FE) MG tablet Take 325 mg by mouth daily with breakfast.      . furosemide (LASIX) 20 MG tablet Take 20 mg by mouth every morning.       Marland Kitchen levothyroxine (SYNTHROID, LEVOTHROID) 75 MCG tablet Take 75 mcg by mouth every morning.       . lidocaine-prilocaine (EMLA) cream Apply 1 application topically daily as needed (Applies to port-a-cath.).      Marland Kitchen ondansetron (ZOFRAN) 8 MG tablet Take 1 tablet (8 mg total) by mouth every 8 (eight) hours as needed for nausea.  20 tablet  2  . pravastatin (PRAVACHOL) 20 MG tablet Take 20 mg by mouth at bedtime.       Marland Kitchen PRESCRIPTION MEDICATION She receives her treatments at the [facility] with Dr. Clelia Croft. She received Fluorouracil 800mg , Camptosar 248mg , Avastin 425mg  and Leucovorin 792mg  on 01/15/13 and an injection of Neulasta 6mg  on 01/07/13.      . valsartan (DIOVAN) 160 MG tablet Take 160 mg by mouth every morning.        No current facility-administered medications for this visit.   Facility-Administered Medications Ordered in Other Visits  Medication Dose Route Frequency Provider Last Rate Last Dose  . 0.9 %  sodium chloride infusion  Intravenous Once Benjiman Core, MD         Allergies:  Allergies  Allergen Reactions  . Shellfish Allergy Itching and Rash    Feels itching internally.  . Meperidine Hcl     REACTION: Hypotension  . Rosuvastatin Other (See Comments)    "Makes my muscles ache and weak in the legs"  . Tramadol Hcl     REACTION: Tongue and lips swell    Past Medical History, Surgical history, Social history, and Family History were reviewed and updated.  Review of Systems:  Remaining ROS negative.  Physical Exam: Blood pressure 132/76, pulse 69, temperature 98 F (36.7 C), temperature source Oral,  resp. rate 20, height 5\' 4"  (1.626 m), weight 175 lb (79.379 kg). ECOG: 1 General appearance: alert Head: Normocephalic, without obvious abnormality, atraumatic Neck: no adenopathy, no carotid bruit, no JVD, supple, symmetrical, trachea midline and thyroid not enlarged, symmetric, no tenderness/mass/nodules Lymph nodes: Cervical, supraclavicular, and axillary nodes normal. Heart:regular rate and rhythm, S1, S2 normal, no murmur, click, rub or gallop Lung:chest clear, no wheezing, rales, normal symmetric air entry Abdomen: soft, non-tender, without masses or organomegaly EXT:no erythema, induration, or nodules   Lab Results: Lab Results  Component Value Date   WBC 2.1* 01/25/2013   HGB 11.1* 01/25/2013   HCT 33.4* 01/25/2013   MCV 91.5 01/25/2013   PLT 284 01/25/2013     Chemistry      Component Value Date/Time   NA 140 01/15/2013 1010   NA 138 09/18/2012 1355   K 3.8 01/15/2013 1010   K 5.5* 09/18/2012 1355   CL 102 09/25/2012 0933   CL 103 09/18/2012 1355   CO2 24 01/15/2013 1010   CO2 24 09/18/2012 1355   BUN 16.8 01/15/2013 1010   BUN 38* 09/18/2012 1355   CREATININE 1.6* 01/15/2013 1010   CREATININE 2.23* 09/18/2012 1355      Component Value Date/Time   CALCIUM 9.2 01/15/2013 1010   CALCIUM 9.7 09/18/2012 1355   ALKPHOS 76 01/15/2013 1010   ALKPHOS 52 08/30/2011 1107   AST 16 01/15/2013 1010   AST 21 08/30/2011 1107   ALT 10 01/15/2013 1010   ALT 18 08/30/2011 1107   BILITOT 0.28 01/15/2013 1010   BILITOT 0.3 08/30/2011 1107     Impression and Plan:  Ashley Davenport is a 79 year old woman with:  1. T3 N0 moderately differentiated adenocarcinoma of the rectum with evidence of LV invasion and positive surgical margins 05/30/2006. She is status post neoadjuvant continuous infusion 5-FU with external radiation therapy between July 10, 2006 and Sep 01, 2006. Now has stage IV disease and S/P 5 cycles of FOLFIRI with Avastin added with cycle 2. She developed grade 2-3 diarrhea,  grade 2 fatigue, and weight loss. CT scan from 11/2012 showed decrease in size of the dominant hepatic mets. Recommend that she proceed with cycle 8 today. CPT-11 dose has already been reduced. No further dose reduction needed at this time.  Due to interruptions of her chemotherapy treatments, I will obtain a CT scan after her ninth cycle  2. Anemia: S/P Feraheme. Anemia and fatigue improving.   3. Neutropenia: Receives Neulasta with each cycle of chemotherapy.  4. nausea and vomiting prophylaxis: On Zofran and compazine.   5. HTN: She is on amlodipine and valsartan. She has been holding her BP meds due to being normotensive without them.  6. Follow up: 11/11 for cycle 9.  7. Port-A-Cath managements: Does have been reversed and a new Port-A-Cath was  inserted on 01/25/2013.     Ashley Davenport 10/28/20148:34 AM

## 2013-01-29 NOTE — Patient Instructions (Signed)
[facility] Discharge Instructions for Patients Receiving Chemotherapy  Today you received the following chemotherapy agents Avastin, Leucovorin, Irinotecan and Adrucil.  To help prevent nausea and vomiting after your treatment, we encourage you to take your nausea medication.   If you develop nausea and vomiting that is not controlled by your nausea medication, call the clinic.   BELOW ARE SYMPTOMS THAT SHOULD BE REPORTED IMMEDIATELY:  *FEVER GREATER THAN 100.5 F  *CHILLS WITH OR WITHOUT FEVER  NAUSEA AND VOMITING THAT IS NOT CONTROLLED WITH YOUR NAUSEA MEDICATION  *UNUSUAL SHORTNESS OF BREATH  *UNUSUAL BRUISING OR BLEEDING  TENDERNESS IN MOUTH AND THROAT WITH OR WITHOUT PRESENCE OF ULCERS  *URINARY PROBLEMS  *BOWEL PROBLEMS  UNUSUAL RASH Items with * indicate a potential emergency and should be followed up as soon as possible.  Feel free to call the clinic you have any questions or concerns. The clinic phone number is [phone].

## 2013-01-29 NOTE — Telephone Encounter (Signed)
gv and printed appt sched and avs for pt OCT and NOV....sed added tx.

## 2013-01-31 ENCOUNTER — Ambulatory Visit: Payer: Medicare Other

## 2013-01-31 ENCOUNTER — Ambulatory Visit (HOSPITAL_BASED_OUTPATIENT_CLINIC_OR_DEPARTMENT_OTHER): Payer: Medicare Other

## 2013-01-31 VITALS — BP 144/64 | HR 65 | Temp 96.9°F | Resp 18

## 2013-01-31 DIAGNOSIS — C787 Secondary malignant neoplasm of liver and intrahepatic bile duct: Secondary | ICD-10-CM

## 2013-01-31 DIAGNOSIS — Z5189 Encounter for other specified aftercare: Secondary | ICD-10-CM

## 2013-01-31 DIAGNOSIS — C20 Malignant neoplasm of rectum: Secondary | ICD-10-CM

## 2013-01-31 MED ORDER — PEGFILGRASTIM INJECTION 6 MG/0.6ML
6.0000 mg | Freq: Once | SUBCUTANEOUS | Status: AC
  Administered 2013-01-31: 6 mg via SUBCUTANEOUS
  Filled 2013-01-31: qty 0.6

## 2013-01-31 MED ORDER — HEPARIN SOD (PORK) LOCK FLUSH 100 UNIT/ML IV SOLN
500.0000 [IU] | Freq: Once | INTRAVENOUS | Status: AC | PRN
  Administered 2013-01-31: 500 [IU]
  Filled 2013-01-31: qty 5

## 2013-01-31 MED ORDER — SODIUM CHLORIDE 0.9 % IJ SOLN
10.0000 mL | INTRAMUSCULAR | Status: DC | PRN
  Administered 2013-01-31: 10 mL
  Filled 2013-01-31: qty 10

## 2013-01-31 NOTE — Patient Instructions (Addendum)
Pegfilgrastim injection What is this medicine? PEGFILGRASTIM (peg fil GRA stim) helps the body make more white blood cells. It is used to prevent infection in people with low amounts of white blood cells following cancer treatment. This medicine may be used for other purposes; ask your health care provider or pharmacist if you have questions. What should I tell my health care provider before I take this medicine? They need to know if you have any of these conditions: -sickle cell disease -an unusual or allergic reaction to pegfilgrastim, filgrastim, E.coli protein, other medicines, foods, dyes, or preservatives -pregnant or trying to get pregnant -breast-feeding How should I use this medicine? This medicine is for injection under the skin. It is usually given by a health care professional in a hospital or clinic setting. If you get this medicine at home, you will be taught how to prepare and give this medicine. Do not shake this medicine. Use exactly as directed. Take your medicine at regular intervals. Do not take your medicine more often than directed. It is important that you put your used needles and syringes in a special sharps container. Do not put them in a trash can. If you do not have a sharps container, call your pharmacist or healthcare provider to get one. Talk to your pediatrician regarding the use of this medicine in children. While this drug may be prescribed for children who weigh more than 45 kg for selected conditions, precautions do apply Overdosage: If you think you have taken too much of this medicine contact a poison control center or emergency room at once. NOTE: This medicine is only for you. Do not share this medicine with others. What if I miss a dose? If you miss a dose, take it as soon as you can. If it is almost time for your next dose, take only that dose. Do not take double or extra doses. What may interact with this medicine? -lithium -medicines for growth  therapy This list may not describe all possible interactions. Give your health care provider a list of all the medicines, herbs, non-prescription drugs, or dietary supplements you use. Also tell them if you smoke, drink alcohol, or use illegal drugs. Some items may interact with your medicine. What should I watch for while using this medicine? Visit your doctor for regular check ups. You will need important blood work done while you are taking this medicine. What side effects may I notice from receiving this medicine? Side effects that you should report to your doctor or health care professional as soon as possible: -allergic reactions like skin rash, itching or hives, swelling of the face, lips, or tongue -breathing problems -fever -pain, redness, or swelling where injected -shoulder pain -stomach or side pain Side effects that usually do not require medical attention (report to your doctor or health care professional if they continue or are bothersome): -aches, pains -headache -loss of appetite -nausea, vomiting -unusually tired This list may not describe all possible side effects. Call your doctor for medical advice about side effects. You may report side effects to FDA at 1-800-FDA-1088. Where should I keep my medicine? Keep out of the reach of children. Store in a refrigerator between 2 and 8 degrees C (36 and 46 degrees F). Do not freeze. Keep in carton to protect from light. Throw away this medicine if it is left out of the refrigerator for more than 48 hours. Throw away any unused medicine after the expiration date. NOTE: This sheet is a summary. It may   not cover all possible information. If you have questions about this medicine, talk to your doctor, pharmacist, or health care provider.  2013, [employer]vier/[employer] Standard. (10/22/2007 3:41:44 PM)   Implanted Port Instructions An implanted port is a central line that has a round shape and is placed under the skin. It is used for  long-term IV (intravenous) access for:  Medicine.  Fluids.  Liquid nutrition, such as TPN (total parenteral nutrition).  Blood samples. Ports can be placed:  In the chest area just below the collarbone (this is the most common place.)  In the arms.  In the belly (abdomen) area.  In the legs. PARTS OF THE PORT A port has 2 main parts:  The reservoir. The reservoir is round, disc-shaped, and will be a small, raised area under your skin.  The reservoir is the part where a needle is inserted (accessed) to either give medicines or to draw blood.  The catheter. The catheter is a long, slender tube that extends from the reservoir. The catheter is placed into a large vein.  Medicine that is inserted into the reservoir goes into the catheter and then into the vein. INSERTION OF THE PORT  The port is surgically placed in either an operating room or in a procedural area (interventional radiology).  Medicine may be given to help you relax during the procedure.  The skin where the port will be inserted is numbed (local anesthetic).  1 or 2 small cuts (incisions) will be made in the skin to insert the port.  The port can be used after it has been inserted. INCISION SITE CARE  The incision site may have small adhesive strips on it. This helps keep the incision site closed. Sometimes, no adhesive strips are placed. Instead of adhesive strips, a special kind of surgical glue is used to keep the incision closed.  If adhesive strips were placed on the incision sites, do not take them off. They will fall off on their own.  The incision site may be sore for 1 to 2 days. Pain medicine can help.  Do not get the incision site wet. Bathe or shower as directed by your caregiver.  The incision site should heal in 5 to 7 days. A small scar may form after the incision has healed. ACCESSING THE PORT Special steps must be taken to access the port:  Before the port is accessed, a numbing cream  can be placed on the skin. This helps numb the skin over the port site.  A sterile technique is used to access the port.  The port is accessed with a needle. Only "non-coring" port needles should be used to access the port. Once the port is accessed, a blood return should be checked. This helps ensure the port is in the vein and is not clogged (clotted).  If your caregiver believes your port should remain accessed, a clear (transparent) bandage will be placed over the needle site. The bandage and needle will need to be changed every week or as directed by your caregiver.  Keep the bandage covering the needle clean and dry. Do not get it wet. Follow your caregiver's instructions on how to take a shower or bath when the port is accessed.  If your port does not need to stay accessed, no bandage is needed over the port. FLUSHING THE PORT Flushing the port keeps it from getting clogged. How often the port is flushed depends on:  If a constant infusion is running. If a constant infusion  is running, the port may not need to be flushed.  If intermittent medicines are given.  If the port is not being used. For intermittent medicines:  The port will need to be flushed:  After medicines have been given.  After blood has been drawn.  As part of routine maintenance.  A port is normally flushed with:  Normal saline.  Heparin.  Follow your caregiver's advice on how often, how much, and the type of flush to use on your port. IMPORTANT PORT INFORMATION  Tell your caregiver if you are allergic to heparin.  After your port is placed, you will get a manufacturer's information card. The card has information about your port. Keep this card with you at all times.  There are many types of ports available. Know what kind of port you have.  In case of an emergency, it may be helpful to wear a medical alert bracelet. This can help alert health care workers that you have a port.  The port can stay  in for as long as your caregiver believes it is necessary.  When it is time for the port to come out, surgery will be done to remove it. The surgery will be similar to how the port was put in.  If you are in the hospital or clinic:  Your port will be taken care of and flushed by a nurse.  If you are at home:  A home health care nurse may give medicines and take care of the port.  You or a family member can get special training and directions for giving medicine and taking care of the port at home. SEEK IMMEDIATE MEDICAL CARE IF:   Your port does not flush or you are unable to get a blood return.  New drainage or pus is coming from the incision.  A bad smell is coming from the incision site.  You develop swelling or increased redness at the incision site.  You develop increased swelling or pain at the port site.  You develop swelling or pain in the surrounding skin near the port.  You have an oral temperature above 102 F (38.9 C), not controlled by medicine. MAKE SURE YOU:   Understand these instructions.  Will watch your condition.  Will get help right away if you are not doing well or get worse. Document Released: 03/21/2005 Document Revised: 06/13/2011 Document Reviewed: 06/12/2008 [facility] Patient Information 2014 [place], [place].

## 2013-02-12 ENCOUNTER — Telehealth: Payer: Self-pay | Admitting: *Deleted

## 2013-02-12 ENCOUNTER — Telehealth: Payer: Self-pay | Admitting: Oncology

## 2013-02-12 ENCOUNTER — Ambulatory Visit (HOSPITAL_BASED_OUTPATIENT_CLINIC_OR_DEPARTMENT_OTHER): Payer: Medicare Other | Admitting: Oncology

## 2013-02-12 ENCOUNTER — Ambulatory Visit (HOSPITAL_BASED_OUTPATIENT_CLINIC_OR_DEPARTMENT_OTHER): Payer: Medicare Other

## 2013-02-12 ENCOUNTER — Other Ambulatory Visit (HOSPITAL_BASED_OUTPATIENT_CLINIC_OR_DEPARTMENT_OTHER): Payer: Medicare Other | Admitting: Lab

## 2013-02-12 VITALS — BP 163/88 | HR 70 | Temp 98.2°F | Resp 18 | Ht 64.0 in | Wt 173.2 lb

## 2013-02-12 DIAGNOSIS — N189 Chronic kidney disease, unspecified: Secondary | ICD-10-CM

## 2013-02-12 DIAGNOSIS — Z5111 Encounter for antineoplastic chemotherapy: Secondary | ICD-10-CM

## 2013-02-12 DIAGNOSIS — D638 Anemia in other chronic diseases classified elsewhere: Secondary | ICD-10-CM

## 2013-02-12 DIAGNOSIS — D631 Anemia in chronic kidney disease: Secondary | ICD-10-CM

## 2013-02-12 DIAGNOSIS — I129 Hypertensive chronic kidney disease with stage 1 through stage 4 chronic kidney disease, or unspecified chronic kidney disease: Secondary | ICD-10-CM

## 2013-02-12 DIAGNOSIS — C20 Malignant neoplasm of rectum: Secondary | ICD-10-CM

## 2013-02-12 DIAGNOSIS — C787 Secondary malignant neoplasm of liver and intrahepatic bile duct: Secondary | ICD-10-CM

## 2013-02-12 DIAGNOSIS — Z5112 Encounter for antineoplastic immunotherapy: Secondary | ICD-10-CM

## 2013-02-12 LAB — COMPREHENSIVE METABOLIC PANEL (CC13)
ALT: 11 U/L (ref 0–55)
AST: 22 U/L (ref 5–34)
Alkaline Phosphatase: 85 U/L (ref 40–150)
Anion Gap: 11 mEq/L (ref 3–11)
CO2: 21 mEq/L — ABNORMAL LOW (ref 22–29)
Calcium: 9.5 mg/dL (ref 8.4–10.4)
Chloride: 107 mEq/L (ref 98–109)
Creatinine: 1.8 mg/dL — ABNORMAL HIGH (ref 0.6–1.1)
Potassium: 4.3 mEq/L (ref 3.5–5.1)
Sodium: 139 mEq/L (ref 136–145)
Total Bilirubin: 0.32 mg/dL (ref 0.20–1.20)
Total Protein: 7.3 g/dL (ref 6.4–8.3)

## 2013-02-12 LAB — CBC WITH DIFFERENTIAL/PLATELET
BASO%: 0.4 % (ref 0.0–2.0)
EOS%: 0.7 % (ref 0.0–7.0)
Eosinophils Absolute: 0.1 10*3/uL (ref 0.0–0.5)
LYMPH%: 10.3 % — ABNORMAL LOW (ref 14.0–49.7)
MCHC: 31.9 g/dL (ref 31.5–36.0)
MCV: 94.7 fL (ref 79.5–101.0)
MONO#: 0.9 10*3/uL (ref 0.1–0.9)
NEUT#: 7.5 10*3/uL — ABNORMAL HIGH (ref 1.5–6.5)
RBC: 3.74 10*6/uL (ref 3.70–5.45)
RDW: 16.7 % — ABNORMAL HIGH (ref 11.2–14.5)
WBC: 9.5 10*3/uL (ref 3.9–10.3)
lymph#: 1 10*3/uL (ref 0.9–3.3)
nRBC: 1 % — ABNORMAL HIGH (ref 0–0)

## 2013-02-12 MED ORDER — IRINOTECAN HCL CHEMO INJECTION 100 MG/5ML
125.0000 mg/m2 | Freq: Once | INTRAVENOUS | Status: AC
  Administered 2013-02-12: 248 mg via INTRAVENOUS
  Filled 2013-02-12: qty 12.4

## 2013-02-12 MED ORDER — ONDANSETRON 16 MG/50ML IVPB (CHCC)
16.0000 mg | Freq: Once | INTRAVENOUS | Status: AC
  Administered 2013-02-12: 16 mg via INTRAVENOUS

## 2013-02-12 MED ORDER — DEXAMETHASONE SODIUM PHOSPHATE 20 MG/5ML IJ SOLN
INTRAMUSCULAR | Status: AC
  Filled 2013-02-12: qty 5

## 2013-02-12 MED ORDER — ONDANSETRON 16 MG/50ML IVPB (CHCC)
INTRAVENOUS | Status: AC
  Filled 2013-02-12: qty 16

## 2013-02-12 MED ORDER — SODIUM CHLORIDE 0.9 % IV SOLN
2400.0000 mg/m2 | INTRAVENOUS | Status: DC
  Administered 2013-02-12: 4750 mg via INTRAVENOUS
  Filled 2013-02-12: qty 95

## 2013-02-12 MED ORDER — SODIUM CHLORIDE 0.9 % IV SOLN
Freq: Once | INTRAVENOUS | Status: AC
  Administered 2013-02-12: 11:00:00 via INTRAVENOUS

## 2013-02-12 MED ORDER — SODIUM CHLORIDE 0.9 % IV SOLN
5.0000 mg/kg | Freq: Once | INTRAVENOUS | Status: AC
  Administered 2013-02-12: 425 mg via INTRAVENOUS
  Filled 2013-02-12: qty 17

## 2013-02-12 MED ORDER — ATROPINE SULFATE 1 MG/ML IJ SOLN
INTRAMUSCULAR | Status: AC
  Filled 2013-02-12: qty 1

## 2013-02-12 MED ORDER — ATROPINE SULFATE 1 MG/ML IJ SOLN
0.5000 mg | Freq: Once | INTRAMUSCULAR | Status: AC | PRN
  Administered 2013-02-12: 0.5 mg via INTRAVENOUS

## 2013-02-12 MED ORDER — FLUOROURACIL CHEMO INJECTION 2.5 GM/50ML
400.0000 mg/m2 | Freq: Once | INTRAVENOUS | Status: AC
  Administered 2013-02-12: 800 mg via INTRAVENOUS
  Filled 2013-02-12: qty 16

## 2013-02-12 MED ORDER — LEUCOVORIN CALCIUM INJECTION 350 MG
404.0000 mg/m2 | Freq: Once | INTRAVENOUS | Status: AC
  Administered 2013-02-12: 800 mg via INTRAVENOUS
  Filled 2013-02-12: qty 40

## 2013-02-12 MED ORDER — DEXAMETHASONE SODIUM PHOSPHATE 20 MG/5ML IJ SOLN
20.0000 mg | Freq: Once | INTRAMUSCULAR | Status: AC
  Administered 2013-02-12: 20 mg via INTRAVENOUS

## 2013-02-12 NOTE — Telephone Encounter (Signed)
Per staff message and POF I have scheduled appts.  JMW  

## 2013-02-12 NOTE — Patient Instructions (Signed)
[facility] Discharge Instructions for Patients Receiving Chemotherapy  Today you received the following chemotherapy agents: Avastin, Irinotecan, Leucovorin, Fluorouracil.  To help prevent nausea and vomiting after your treatment, we encourage you to take your nausea medication, Zofran. Begin the morning of 02/13/13, take one every 12 hours for the next 2 days.   If you develop nausea and vomiting that is not controlled by your nausea medication, call the clinic.   BELOW ARE SYMPTOMS THAT SHOULD BE REPORTED IMMEDIATELY:  *FEVER GREATER THAN 100.5 F  *CHILLS WITH OR WITHOUT FEVER  NAUSEA AND VOMITING THAT IS NOT CONTROLLED WITH YOUR NAUSEA MEDICATION  *UNUSUAL SHORTNESS OF BREATH  *UNUSUAL BRUISING OR BLEEDING  TENDERNESS IN MOUTH AND THROAT WITH OR WITHOUT PRESENCE OF ULCERS  *URINARY PROBLEMS  *BOWEL PROBLEMS  UNUSUAL RASH Items with * indicate a potential emergency and should be followed up as soon as possible.  Feel free to call the clinic should you have any questions or concerns. The clinic phone number is [phone].

## 2013-02-12 NOTE — Progress Notes (Signed)
Hematology and Oncology Follow Up Visit  Ashley Davenport [id number] Sep 11, 1930 79 y.o. 02/12/2013 10:00 AM [name], MDClark, Lindell Spar, MD   Principle Diagnosis: 79 year old woman with rectal cancer and anemia of chronic disease secondary to kidney disease. She was diagnosed with rectal cancer on  09/01/2006.  She hadT3 N0 moderately differentiated adenocarcinoma of the rectum with evidence of LV invasion. Now she has stage IV disease with liver mets.    Prior Therapy: She is status post neoadjuvant continuous infusion 5-FU with external radiation therapy between July 10, 2006 and Sep 01, 2006.  She declined abdominoperineal resection. She went on to receive additional 3 cycles of Xeloda, but developed hand-foot syndrome and declined further therapy.   Current therapy: She is on FOLFIRI + Avastin. First cycle given on 6/27 with FOLFIRI alone. Cycle 4 was delayed due to grade 2 fatigue and grade 2-3 diarrhea. She is here for cycle 9 of chemo with dose reduction of her Irinotecan to 125 mg/m2.   Interim History: Ashley Davenport presents today for a follow up visit. She is a very nice women with the above diagnosis. She is here for the next cycle of chemotherapy. Her last chemotherapy given without complications related to her Port-A-Cath. Fatigue is better today. Remains independent in ADLs. Appetite is still decreased, but weight is stable. Besides pain secondary to neuralgia from shingles, she has no abnormal discomfort/pain. Diarrhea is better. No nausea or vomiting. No chest pain, shortness of breath, or dyspnea. No new complications from chemotherapy. Feels a very well overall and close to her baseline level of activity. Although her taste have been diminished for the most part maintained a reasonable intake and reasonable weight.   Medications: I have reviewed the patient's current medications.  Current Outpatient Prescriptions  Medication Sig Dispense Refill  . allopurinol  (ZYLOPRIM) 150 mg TABS Take 150 mg by mouth every morning.       Marland Kitchen amLODipine (NORVASC) 5 MG tablet Take 5 mg by mouth every morning.       . Biotin 5000 MCG CAPS Take 5,000 mcg by mouth every morning.       . Coenzyme Q10 (COQ-10) 200 MG CAPS Take 200 mg by mouth at bedtime.       . ferrous sulfate 325 (65 FE) MG tablet Take 325 mg by mouth daily with breakfast.      . furosemide (LASIX) 20 MG tablet Take 20 mg by mouth every morning.       Marland Kitchen levothyroxine (SYNTHROID, LEVOTHROID) 75 MCG tablet Take 75 mcg by mouth every morning.       . lidocaine-prilocaine (EMLA) cream Apply 1 application topically daily as needed (Applies to port-a-cath.).      Marland Kitchen ondansetron (ZOFRAN) 8 MG tablet Take 1 tablet (8 mg total) by mouth every 8 (eight) hours as needed for nausea.  20 tablet  2  . pravastatin (PRAVACHOL) 20 MG tablet Take 20 mg by mouth at bedtime.       Marland Kitchen PRESCRIPTION MEDICATION She receives her treatments at the [facility] with Dr. Clelia Croft. She received Fluorouracil 800mg , Camptosar 248mg , Avastin 425mg  and Leucovorin 792mg  on 01/15/13 and an injection of Neulasta 6mg  on 01/07/13.      . valsartan (DIOVAN) 160 MG tablet Take 160 mg by mouth every morning.        No current facility-administered medications for this visit.   Facility-Administered Medications Ordered in Other Visits  Medication Dose Route Frequency Provider Last Rate  Last Dose  . 0.9 %  sodium chloride infusion   Intravenous Once Benjiman Core, MD         Allergies:  Allergies  Allergen Reactions  . Shellfish Allergy Itching and Rash    Feels itching internally.  . Meperidine Hcl     REACTION: Hypotension  . Rosuvastatin Other (See Comments)    "Makes my muscles ache and weak in the legs"  . Tramadol Hcl     REACTION: Tongue and lips swell    Past Medical History, Surgical history, Social history, and Family History were reviewed and updated.  Review of Systems:  Remaining ROS negative.  Physical  Exam: Blood pressure 163/88, pulse 70, temperature 98.2 F (36.8 C), temperature source Oral, resp. rate 18, height 5\' 4"  (1.626 m), weight 173 lb 3.2 oz (78.563 kg). ECOG: 1 General appearance: alert Head: Normocephalic, without obvious abnormality, atraumatic Neck: no adenopathy, no carotid bruit, no JVD, supple, symmetrical, trachea midline and thyroid not enlarged, symmetric, no tenderness/mass/nodules Lymph nodes: Cervical, supraclavicular, and axillary nodes normal. Heart:regular rate and rhythm, S1, S2 normal, no murmur, click, rub or gallop Lung:chest clear, no wheezing, rales, normal symmetric air entry Abdomen: soft, non-tender, without masses or organomegaly EXT:no erythema, induration, or nodules   Lab Results: Lab Results  Component Value Date   WBC 9.5 02/12/2013   HGB 11.3* 02/12/2013   HCT 35.4 02/12/2013   MCV 94.7 02/12/2013   PLT 289 02/12/2013     Chemistry      Component Value Date/Time   NA 138 01/29/2013 0805   NA 138 09/18/2012 1355   K 4.1 01/29/2013 0805   K 5.5* 09/18/2012 1355   CL 102 09/25/2012 0933   CL 103 09/18/2012 1355   CO2 21* 01/29/2013 0805   CO2 24 09/18/2012 1355   BUN 20.0 01/29/2013 0805   BUN 38* 09/18/2012 1355   CREATININE 1.8* 01/29/2013 0805   CREATININE 2.23* 09/18/2012 1355      Component Value Date/Time   CALCIUM 9.3 01/29/2013 0805   CALCIUM 9.7 09/18/2012 1355   ALKPHOS 55 01/29/2013 0805   ALKPHOS 52 08/30/2011 1107   AST 17 01/29/2013 0805   AST 21 08/30/2011 1107   ALT 8 01/29/2013 0805   ALT 18 08/30/2011 1107   BILITOT 0.27 01/29/2013 0805   BILITOT 0.3 08/30/2011 1107     Impression and Plan:  Ashley Davenport is a 79 year old woman with:  1. T3 N0 moderately differentiated adenocarcinoma of the rectum with evidence of LV invasion and positive surgical margins 05/30/2006. She is status post neoadjuvant continuous infusion 5-FU with external radiation therapy between July 10, 2006 and Sep 01, 2006. FOLFIRI with Avastin  added with cycle 2. She developed grade 2-3 diarrhea, grade 2 fatigue, and weight loss. CT scan from 11/2012 showed decrease in size of the dominant hepatic mets. Recommend that she proceed with cycle 9 today. CPT-11 dose has already been reduced. No further dose reduction needed at this time.  Due to interruptions of her chemotherapy treatments, I will obtain a CT scan after  this cycle of chemotherapy.   2. Anemia: S/P Feraheme. Anemia and fatigue improving.   3. Neutropenia: Receives Neulasta with each cycle of chemotherapy.  4. nausea and vomiting prophylaxis: On Zofran and compazine.   5. HTN: She is on amlodipine and valsartan. She has been holding her BP meds due to being normotensive without them.  6. Follow up: After a CT scan on December 1 and possibly the  next chemotherapy on December 2.  7. Port-A-Cath managements: Does have been reversed and a new Port-A-Cath was inserted on 01/25/2013.     Myracle Febres 11/11/201410:00 AM

## 2013-02-12 NOTE — Telephone Encounter (Signed)
Gave pt apt for lab,md and cCT, gave pt oral contrast for CT,emailed michelle for December chemo

## 2013-02-12 NOTE — Telephone Encounter (Signed)
Called pt left message regarding lab,md and chemo for December 2014

## 2013-02-13 ENCOUNTER — Other Ambulatory Visit: Payer: Self-pay | Admitting: Oncology

## 2013-02-14 ENCOUNTER — Ambulatory Visit (HOSPITAL_BASED_OUTPATIENT_CLINIC_OR_DEPARTMENT_OTHER): Payer: Medicare Other

## 2013-02-14 VITALS — BP 136/74 | HR 70 | Temp 97.1°F

## 2013-02-14 DIAGNOSIS — C20 Malignant neoplasm of rectum: Secondary | ICD-10-CM

## 2013-02-14 DIAGNOSIS — Z5189 Encounter for other specified aftercare: Secondary | ICD-10-CM

## 2013-02-14 MED ORDER — SODIUM CHLORIDE 0.9 % IJ SOLN
10.0000 mL | INTRAMUSCULAR | Status: DC | PRN
  Administered 2013-02-14: 10 mL
  Filled 2013-02-14: qty 10

## 2013-02-14 MED ORDER — HEPARIN SOD (PORK) LOCK FLUSH 100 UNIT/ML IV SOLN
500.0000 [IU] | Freq: Once | INTRAVENOUS | Status: AC | PRN
  Administered 2013-02-14: 500 [IU]
  Filled 2013-02-14: qty 5

## 2013-02-14 MED ORDER — PEGFILGRASTIM INJECTION 6 MG/0.6ML
6.0000 mg | Freq: Once | SUBCUTANEOUS | Status: AC
  Administered 2013-02-14: 6 mg via SUBCUTANEOUS
  Filled 2013-02-14: qty 0.6

## 2013-02-14 NOTE — Patient Instructions (Signed)

## 2013-03-04 ENCOUNTER — Ambulatory Visit (HOSPITAL_COMMUNITY)
Admission: RE | Admit: 2013-03-04 | Discharge: 2013-03-04 | Disposition: A | Discharge status: Home / Self Care | Payer: Medicare Other | Source: Ambulatory Visit | Attending: Oncology | Admitting: Oncology

## 2013-03-04 ENCOUNTER — Ambulatory Visit (HOSPITAL_BASED_OUTPATIENT_CLINIC_OR_DEPARTMENT_OTHER): Payer: Medicare Other | Admitting: Lab

## 2013-03-04 DIAGNOSIS — C787 Secondary malignant neoplasm of liver and intrahepatic bile duct: Secondary | ICD-10-CM | POA: Insufficient documentation

## 2013-03-04 DIAGNOSIS — R918 Other nonspecific abnormal finding of lung field: Secondary | ICD-10-CM | POA: Insufficient documentation

## 2013-03-04 DIAGNOSIS — N281 Cyst of kidney, acquired: Secondary | ICD-10-CM | POA: Insufficient documentation

## 2013-03-04 DIAGNOSIS — I714 Abdominal aortic aneurysm, without rupture, unspecified: Secondary | ICD-10-CM | POA: Insufficient documentation

## 2013-03-04 DIAGNOSIS — C20 Malignant neoplasm of rectum: Secondary | ICD-10-CM

## 2013-03-04 DIAGNOSIS — K7689 Other specified diseases of liver: Secondary | ICD-10-CM | POA: Insufficient documentation

## 2013-03-04 DIAGNOSIS — K5730 Diverticulosis of large intestine without perforation or abscess without bleeding: Secondary | ICD-10-CM | POA: Insufficient documentation

## 2013-03-04 DIAGNOSIS — Z90710 Acquired absence of both cervix and uterus: Secondary | ICD-10-CM | POA: Insufficient documentation

## 2013-03-04 DIAGNOSIS — M948X9 Other specified disorders of cartilage, unspecified sites: Secondary | ICD-10-CM | POA: Insufficient documentation

## 2013-03-04 LAB — CBC WITH DIFFERENTIAL/PLATELET
BASO%: 0.9 % (ref 0.0–2.0)
Basophils Absolute: 0.1 10*3/uL (ref 0.0–0.1)
EOS%: 1.9 % (ref 0.0–7.0)
Eosinophils Absolute: 0.2 10*3/uL (ref 0.0–0.5)
HCT: 37.3 % (ref 34.8–46.6)
HGB: 12 g/dL (ref 11.6–15.9)
LYMPH%: 13.6 % — ABNORMAL LOW (ref 14.0–49.7)
MCH: 30.8 pg (ref 25.1–34.0)
MCHC: 32.1 g/dL (ref 31.5–36.0)
MCV: 95.9 fL (ref 79.5–101.0)
NEUT#: 7.3 10*3/uL — ABNORMAL HIGH (ref 1.5–6.5)
NEUT%: 75.3 % (ref 38.4–76.8)
RBC: 3.88 10*6/uL (ref 3.70–5.45)
RDW: 17.3 % — ABNORMAL HIGH (ref 11.2–14.5)
lymph#: 1.3 10*3/uL (ref 0.9–3.3)

## 2013-03-04 LAB — COMPREHENSIVE METABOLIC PANEL (CC13)
ALT: 10 U/L (ref 0–55)
Albumin: 3.6 g/dL (ref 3.5–5.0)
Anion Gap: 13 mEq/L — ABNORMAL HIGH (ref 3–11)
BUN: 14 mg/dL (ref 7.0–26.0)
Calcium: 9.2 mg/dL (ref 8.4–10.4)
Chloride: 102 mEq/L (ref 98–109)
Creatinine: 1.6 mg/dL — ABNORMAL HIGH (ref 0.6–1.1)
Glucose: 108 mg/dl (ref 70–140)
Potassium: 3.7 mEq/L (ref 3.5–5.1)

## 2013-03-04 MED ORDER — IOHEXOL 300 MG/ML  SOLN
100.0000 mL | Freq: Once | INTRAMUSCULAR | Status: AC | PRN
  Administered 2013-03-04: 100 mL via INTRAVENOUS

## 2013-03-05 ENCOUNTER — Encounter (INDEPENDENT_AMBULATORY_CARE_PROVIDER_SITE_OTHER): Payer: Self-pay

## 2013-03-05 ENCOUNTER — Ambulatory Visit (HOSPITAL_BASED_OUTPATIENT_CLINIC_OR_DEPARTMENT_OTHER): Payer: Medicare Other | Admitting: Oncology

## 2013-03-05 ENCOUNTER — Telehealth: Payer: Self-pay | Admitting: *Deleted

## 2013-03-05 ENCOUNTER — Ambulatory Visit (HOSPITAL_BASED_OUTPATIENT_CLINIC_OR_DEPARTMENT_OTHER): Payer: Medicare Other

## 2013-03-05 ENCOUNTER — Telehealth: Payer: Self-pay | Admitting: Oncology

## 2013-03-05 VITALS — BP 152/78 | HR 78 | Temp 97.5°F | Resp 20 | Wt 171.1 lb

## 2013-03-05 VITALS — BP 149/75 | HR 70

## 2013-03-05 DIAGNOSIS — C787 Secondary malignant neoplasm of liver and intrahepatic bile duct: Secondary | ICD-10-CM

## 2013-03-05 DIAGNOSIS — N189 Chronic kidney disease, unspecified: Secondary | ICD-10-CM

## 2013-03-05 DIAGNOSIS — Z5111 Encounter for antineoplastic chemotherapy: Secondary | ICD-10-CM

## 2013-03-05 DIAGNOSIS — D631 Anemia in chronic kidney disease: Secondary | ICD-10-CM

## 2013-03-05 DIAGNOSIS — C20 Malignant neoplasm of rectum: Secondary | ICD-10-CM

## 2013-03-05 DIAGNOSIS — I10 Essential (primary) hypertension: Secondary | ICD-10-CM

## 2013-03-05 DIAGNOSIS — R197 Diarrhea, unspecified: Secondary | ICD-10-CM

## 2013-03-05 DIAGNOSIS — R5381 Other malaise: Secondary | ICD-10-CM

## 2013-03-05 LAB — UA PROTEIN, DIPSTICK - CHCC: Protein, ur: 100 mg/dL

## 2013-03-05 LAB — CEA: CEA: 4.7 ng/mL (ref 0.0–5.0)

## 2013-03-05 MED ORDER — ATROPINE SULFATE 1 MG/ML IJ SOLN
0.5000 mg | Freq: Once | INTRAMUSCULAR | Status: AC | PRN
  Administered 2013-03-05: 0.5 mg via INTRAVENOUS

## 2013-03-05 MED ORDER — FLUOROURACIL CHEMO INJECTION 2.5 GM/50ML
400.0000 mg/m2 | Freq: Once | INTRAVENOUS | Status: AC
  Administered 2013-03-05: 800 mg via INTRAVENOUS
  Filled 2013-03-05: qty 16

## 2013-03-05 MED ORDER — SODIUM CHLORIDE 0.9 % IV SOLN
Freq: Once | INTRAVENOUS | Status: AC
  Administered 2013-03-05: 10:00:00 via INTRAVENOUS

## 2013-03-05 MED ORDER — SODIUM CHLORIDE 0.9 % IV SOLN
5.0000 mg/kg | Freq: Once | INTRAVENOUS | Status: AC
  Administered 2013-03-05: 425 mg via INTRAVENOUS
  Filled 2013-03-05: qty 17

## 2013-03-05 MED ORDER — LEUCOVORIN CALCIUM INJECTION 350 MG
800.0000 mg | Freq: Once | INTRAVENOUS | Status: AC
  Administered 2013-03-05: 800 mg via INTRAVENOUS
  Filled 2013-03-05: qty 40

## 2013-03-05 MED ORDER — ONDANSETRON 16 MG/50ML IVPB (CHCC)
INTRAVENOUS | Status: AC
  Filled 2013-03-05: qty 16

## 2013-03-05 MED ORDER — SODIUM CHLORIDE 0.9 % IV SOLN
2400.0000 mg/m2 | INTRAVENOUS | Status: DC
  Administered 2013-03-05: 4750 mg via INTRAVENOUS
  Filled 2013-03-05: qty 95

## 2013-03-05 MED ORDER — ATROPINE SULFATE 1 MG/ML IJ SOLN
INTRAMUSCULAR | Status: AC
  Filled 2013-03-05: qty 1

## 2013-03-05 MED ORDER — ONDANSETRON 16 MG/50ML IVPB (CHCC)
16.0000 mg | Freq: Once | INTRAVENOUS | Status: AC
  Administered 2013-03-05: 16 mg via INTRAVENOUS

## 2013-03-05 MED ORDER — DEXTROSE 5 % IV SOLN
125.0000 mg/m2 | Freq: Once | INTRAVENOUS | Status: AC
  Administered 2013-03-05: 248 mg via INTRAVENOUS
  Filled 2013-03-05: qty 12.4

## 2013-03-05 MED ORDER — DEXAMETHASONE SODIUM PHOSPHATE 20 MG/5ML IJ SOLN
INTRAMUSCULAR | Status: AC
  Filled 2013-03-05: qty 5

## 2013-03-05 MED ORDER — DEXAMETHASONE SODIUM PHOSPHATE 20 MG/5ML IJ SOLN
20.0000 mg | Freq: Once | INTRAMUSCULAR | Status: AC
  Administered 2013-03-05: 20 mg via INTRAVENOUS

## 2013-03-05 NOTE — Telephone Encounter (Signed)
gv and printed appt sched and avs forpt for DEC adn Jan....sed addtx and sent msg to MW  to add tx.

## 2013-03-05 NOTE — Telephone Encounter (Signed)
Per staff message and POF I have scheduled appts.  JMW  

## 2013-03-05 NOTE — Progress Notes (Signed)
OK to give Avastin per Renella Cunas, PharmD and Dr. Clelia Croft with urine protein 100.

## 2013-03-05 NOTE — Patient Instructions (Signed)
[facility] Discharge Instructions for Patients Receiving Chemotherapy  Today you received the following chemotherapy agents: Avastin, Leucovorin, Irinotecan, 5FU  To help prevent nausea and vomiting after your treatment, we encourage you to take your nausea medication as prescribed.    If you develop nausea and vomiting that is not controlled by your nausea medication, call the clinic.   BELOW ARE SYMPTOMS THAT SHOULD BE REPORTED IMMEDIATELY:  *FEVER GREATER THAN 100.5 F  *CHILLS WITH OR WITHOUT FEVER  NAUSEA AND VOMITING THAT IS NOT CONTROLLED WITH YOUR NAUSEA MEDICATION  *UNUSUAL SHORTNESS OF BREATH  *UNUSUAL BRUISING OR BLEEDING  TENDERNESS IN MOUTH AND THROAT WITH OR WITHOUT PRESENCE OF ULCERS  *URINARY PROBLEMS  *BOWEL PROBLEMS  UNUSUAL RASH Items with * indicate a potential emergency and should be followed up as soon as possible.  Feel free to call the clinic you have any questions or concerns. The clinic phone number is [phone].    

## 2013-03-05 NOTE — Progress Notes (Signed)
Hematology and Oncology Follow Up Visit  Ashley Davenport [id number] Jun 16, 1930 79 y.o. 03/05/2013 9:24 AM Ashley Davenport, MDClark, Ashley Spar, MD   Principle Diagnosis: 79 year old woman with rectal cancer and anemia of chronic disease secondary to kidney disease. She was diagnosed with rectal cancer on  09/01/2006.  She hadT3 N0 moderately differentiated adenocarcinoma of the rectum with evidence of LV invasion. Now she has stage IV disease with liver mets.    Prior Therapy: She is status post neoadjuvant continuous infusion 5-FU with external radiation therapy between July 10, 2006 and Sep 01, 2006.  She declined abdominoperineal resection. She went on to receive additional 3 cycles of Xeloda, but developed hand-foot syndrome and declined further therapy.   Current therapy: She is on FOLFIRI + Avastin. First cycle given on 6/27 with FOLFIRI alone. Cycle 4 was delayed due to grade 2 fatigue and grade 2-3 diarrhea. She is here for cycle 10 of chemotherapy with dose reduction of her Irinotecan to 125 mg/m2.   Interim History: Ashley Davenport presents today for a follow up visit. She is a very nice women with the above diagnosis. She is here for the next cycle of chemotherapy. Her last chemotherapy given without complications related to her Port-A-Cath. Fatigue is better today. Remains independent in ADLs. Appetite is still decreased, but weight is stable. Besides pain secondary to neuralgia from shingles, she has no abnormal discomfort/pain. Diarrhea is better and controlled with Lomotil. No nausea or vomiting. No chest pain, shortness of breath, or dyspnea. No new complications from chemotherapy. Feels a very well overall and close to her baseline level of activity. Although her taste have been diminished for the most part maintained a reasonable intake did report slight weight loss.   Medications: I have reviewed the patient's current medications.  Current Outpatient Prescriptions  Medication  Sig Dispense Refill  . allopurinol (ZYLOPRIM) 150 mg TABS Take 150 mg by mouth every morning.       Marland Kitchen amLODipine (NORVASC) 5 MG tablet Take 5 mg by mouth every morning.       . Biotin 5000 MCG CAPS Take 5,000 mcg by mouth every morning.       . Coenzyme Q10 (COQ-10) 200 MG CAPS Take 200 mg by mouth at bedtime.       . ferrous sulfate 325 (65 FE) MG tablet Take 325 mg by mouth daily with breakfast.      . furosemide (LASIX) 20 MG tablet Take 20 mg by mouth every morning.       Marland Kitchen levothyroxine (SYNTHROID, LEVOTHROID) 75 MCG tablet Take 75 mcg by mouth every morning.       . lidocaine-prilocaine (EMLA) cream Apply 1 application topically daily as needed (Applies to port-a-cath.).      Marland Kitchen ondansetron (ZOFRAN) 8 MG tablet Take 1 tablet (8 mg total) by mouth every 8 (eight) hours as needed for nausea.  20 tablet  2  . pravastatin (PRAVACHOL) 20 MG tablet Take 20 mg by mouth at bedtime.       Marland Kitchen PRESCRIPTION MEDICATION She receives her treatments at the [facility] with Dr. Clelia Croft. She received Fluorouracil 800mg , Camptosar 248mg , Avastin 425mg  and Leucovorin 792mg  on 01/15/13 and an injection of Neulasta 6mg  on 01/07/13.      . valsartan (DIOVAN) 160 MG tablet Take 160 mg by mouth every morning.        No current facility-administered medications for this visit.   Facility-Administered Medications Ordered in Other Visits  Medication  Dose Route Frequency Provider Last Rate Last Dose  . 0.9 %  sodium chloride infusion   Intravenous Once Benjiman Core, MD         Allergies:  Allergies  Allergen Reactions  . Shellfish Allergy Itching and Rash    Feels itching internally.  . Meperidine Hcl     REACTION: Hypotension  . Rosuvastatin Other (See Comments)    "Makes my muscles ache and weak in the legs"  . Tramadol Hcl     REACTION: Tongue and lips swell    Past Medical History, Surgical history, Social history, and Family History were reviewed and updated.  Review of  Systems:  Remaining ROS negative.  Physical Exam: Blood pressure 152/78, pulse 78, temperature 97.5 F (36.4 C), resp. rate 20, weight 171 lb 2 oz (77.622 kg). ECOG: 1 General appearance: alert Head: Normocephalic, without obvious abnormality, atraumatic Neck: no adenopathy, no carotid bruit, no JVD, supple, symmetrical, trachea midline and thyroid not enlarged, symmetric, no tenderness/mass/nodules Lymph nodes: Cervical, supraclavicular, and axillary nodes normal. Heart:regular rate and rhythm, S1, S2 normal, no murmur, click, rub or gallop Lung:chest clear, no wheezing, rales, normal symmetric air entry Abdomen: soft, non-tender, without masses or organomegaly EXT:no erythema, induration, or nodules   Lab Results: Lab Results  Component Value Date   WBC 9.7 03/04/2013   HGB 12.0 03/04/2013   HCT 37.3 03/04/2013   MCV 95.9 03/04/2013   PLT 304 03/04/2013     Chemistry      Component Value Date/Time   NA 140 03/04/2013 1408   NA 138 09/18/2012 1355   K 3.7 03/04/2013 1408   K 5.5* 09/18/2012 1355   CL 102 09/25/2012 0933   CL 103 09/18/2012 1355   CO2 26 03/04/2013 1408   CO2 24 09/18/2012 1355   BUN 14.0 03/04/2013 1408   BUN 38* 09/18/2012 1355   CREATININE 1.6* 03/04/2013 1408   CREATININE 2.23* 09/18/2012 1355      Component Value Date/Time   CALCIUM 9.2 03/04/2013 1408   CALCIUM 9.7 09/18/2012 1355   ALKPHOS 77 03/04/2013 1408   ALKPHOS 52 08/30/2011 1107   AST 21 03/04/2013 1408   AST 21 08/30/2011 1107   ALT 10 03/04/2013 1408   ALT 18 08/30/2011 1107   BILITOT 0.34 03/04/2013 1408   BILITOT 0.3 08/30/2011 1107     EXAM:  CT CHEST, ABDOMEN, AND PELVIS WITH CONTRAST  TECHNIQUE:  Multidetector CT imaging of the chest, abdomen and pelvis was  performed following the standard protocol during bolus  administration of intravenous contrast.  CONTRAST: OMNIPAQUE IOHEXOL 300 MG/ML SOLN  COMPARISON: 11/13/2012  FINDINGS:  CT CHEST FINDINGS  No pleural effusion identified. No  airspace consolidation  identified. Stable small subpleural nodule in the periphery of the  right upper lobe, image 17/series 4 and superior segment of right  lower lobe, image 26/series 4.  The trachea appears patent and is midline. There is no enlarged  mediastinal or hilar lymph nodes identified. No supraclavicular or  axillary adenopathy.  There are several nonspecific sclerotic foci involving the manubrium  and body of sternum, image 14/series 2 and image 25/ series 2. This  appears unchanged from previous exam.  CT ABDOMEN AND PELVIS FINDINGS  Mass within the medial segment of the left hepatic lobe measures 4.7  x 5.2 cm, image 53/series 2. Previously 5.9 x 5.9 cm. Several cysts  are noted along the dome of the liver. Gallbladder appears  collapsed. No biliary dilatation. Normal  appearance of the pancreas.  The spleen is on unremarkable.  Normal appearance of both adrenal glands. Bilateral renal cysts are  again noted and appears similar to previous exam. The urinary  bladder appears normal. Previous hysterectomy. Abdominal aortic  aneurysm is again identified status post stent graft repair. The  aneurysm sac measures 4.1 cm, image 76/series 2. This is compared  with 4.1 cm previously. There is a focal area of enhancement within  the aneurysm sac, image 76/series 2. Cannot rule out endoleak.  No upper abdominal adenopathy identified. There is no pelvic or  inguinal adenopathy noted.  The stomach appears normal. The small bowel loops have a normal  course and caliber without obstruction. The appendix is visualized  and appears normal.  The proximal colon appears normal. Multiple distal colonic  diverticula are identified. No acute inflammation.  Review of the visualized osseous structures shows no aggressive  lytic or sclerotic bone lesion.  IMPRESSION:  Chest CT:  1. Stable CT of the chest.  2. No change in small subpleural nodules within the right lung.  3. Small  nonspecific foci of sclerosis involving the manubrium and  body of sternum are unchanged.  Abdomen CT:  1. Decrease in size of liver metastasis.  2. Status post stent graft repair of abdominal aortic aneurysm.  Enhancement within the aneurysm sac is identified which may indicate  presence of endoleak. The diameter of the aneurysm sac remains  stable.    Impression and Plan:  Mrs. Hollywood is a 79 year old woman with:  1. T3 N0 moderately differentiated adenocarcinoma of the rectum with evidence of LV invasion and positive surgical margins 05/30/2006. She is status post neoadjuvant continuous infusion 5-FU with external radiation therapy between July 10, 2006 and Sep 01, 2006. FOLFIRI with Avastin added with cycle 2. She developed grade 2-3 diarrhea, grade 2 fatigue, and weight loss. CT scan from 03/04/2013 was discussed today with the patient and her family which showed showed decrease in size of the dominant hepatic mets. Recommend that she proceed with cycle 10 today. CPT-11 dose has already been reduced. No further dose reduction needed at this time. The plan is to proceed with 2 more months of chemotherapy and potentially switch her to a maintenance phase with 5-FU and a Avastin.   2. Anemia: S/P Feraheme. Anemia and fatigue improving.   3. Neutropenia: Receives Neulasta with each cycle of chemotherapy.  4. Nausea and vomiting prophylaxis: On Zofran and compazine.   5. HTN: She is on amlodipine and valsartan. She has been holding her BP meds due to being normotensive without them.  6. Follow up: In 2 weeks for her next cycle of chemotherapy.  7. Port-A-Cath managements: Does have been reversed and a new Port-A-Cath was inserted on 01/25/2013.     Lanna Labella 12/2/20149:24 AM

## 2013-03-07 ENCOUNTER — Ambulatory Visit (HOSPITAL_BASED_OUTPATIENT_CLINIC_OR_DEPARTMENT_OTHER): Payer: Medicare Other

## 2013-03-07 ENCOUNTER — Encounter (INDEPENDENT_AMBULATORY_CARE_PROVIDER_SITE_OTHER): Payer: Self-pay

## 2013-03-07 DIAGNOSIS — C787 Secondary malignant neoplasm of liver and intrahepatic bile duct: Secondary | ICD-10-CM

## 2013-03-07 DIAGNOSIS — Z5189 Encounter for other specified aftercare: Secondary | ICD-10-CM

## 2013-03-07 DIAGNOSIS — C20 Malignant neoplasm of rectum: Secondary | ICD-10-CM

## 2013-03-07 MED ORDER — HEPARIN SOD (PORK) LOCK FLUSH 100 UNIT/ML IV SOLN
500.0000 [IU] | Freq: Once | INTRAVENOUS | Status: AC | PRN
  Administered 2013-03-07: 500 [IU]
  Filled 2013-03-07: qty 5

## 2013-03-07 MED ORDER — SODIUM CHLORIDE 0.9 % IJ SOLN
10.0000 mL | INTRAMUSCULAR | Status: DC | PRN
  Administered 2013-03-07: 10 mL
  Filled 2013-03-07: qty 10

## 2013-03-07 MED ORDER — PEGFILGRASTIM INJECTION 6 MG/0.6ML
6.0000 mg | Freq: Once | SUBCUTANEOUS | Status: AC
  Administered 2013-03-07: 6 mg via SUBCUTANEOUS
  Filled 2013-03-07: qty 0.6

## 2013-03-07 NOTE — Patient Instructions (Signed)

## 2013-03-19 ENCOUNTER — Ambulatory Visit (HOSPITAL_BASED_OUTPATIENT_CLINIC_OR_DEPARTMENT_OTHER): Payer: Medicare Other | Admitting: Oncology

## 2013-03-19 ENCOUNTER — Other Ambulatory Visit (HOSPITAL_BASED_OUTPATIENT_CLINIC_OR_DEPARTMENT_OTHER): Payer: Medicare Other

## 2013-03-19 ENCOUNTER — Ambulatory Visit (HOSPITAL_BASED_OUTPATIENT_CLINIC_OR_DEPARTMENT_OTHER): Payer: Medicare Other

## 2013-03-19 VITALS — BP 159/75 | HR 75 | Temp 97.0°F | Resp 18 | Ht 64.0 in | Wt 169.9 lb

## 2013-03-19 VITALS — BP 131/71 | HR 65

## 2013-03-19 DIAGNOSIS — Z5112 Encounter for antineoplastic immunotherapy: Secondary | ICD-10-CM

## 2013-03-19 DIAGNOSIS — C20 Malignant neoplasm of rectum: Secondary | ICD-10-CM

## 2013-03-19 DIAGNOSIS — Z5111 Encounter for antineoplastic chemotherapy: Secondary | ICD-10-CM

## 2013-03-19 DIAGNOSIS — C787 Secondary malignant neoplasm of liver and intrahepatic bile duct: Secondary | ICD-10-CM

## 2013-03-19 DIAGNOSIS — D709 Neutropenia, unspecified: Secondary | ICD-10-CM

## 2013-03-19 DIAGNOSIS — D649 Anemia, unspecified: Secondary | ICD-10-CM

## 2013-03-19 LAB — COMPREHENSIVE METABOLIC PANEL (CC13)
ALT: 11 U/L (ref 0–55)
AST: 18 U/L (ref 5–34)
Albumin: 3.5 g/dL (ref 3.5–5.0)
Alkaline Phosphatase: 96 U/L (ref 40–150)
Anion Gap: 10 mEq/L (ref 3–11)
BUN: 18.3 mg/dL (ref 7.0–26.0)
CO2: 22 mEq/L (ref 22–29)
Calcium: 9.6 mg/dL (ref 8.4–10.4)
Chloride: 108 mEq/L (ref 98–109)
Glucose: 98 mg/dl (ref 70–140)
Potassium: 4.4 mEq/L (ref 3.5–5.1)
Sodium: 140 mEq/L (ref 136–145)

## 2013-03-19 LAB — CBC WITH DIFFERENTIAL/PLATELET
BASO%: 0.3 % (ref 0.0–2.0)
Basophils Absolute: 0 10*3/uL (ref 0.0–0.1)
EOS%: 2.2 % (ref 0.0–7.0)
MCH: 30.7 pg (ref 25.1–34.0)
MCHC: 32.8 g/dL (ref 31.5–36.0)
MONO#: 0.7 10*3/uL (ref 0.1–0.9)
RBC: 3.68 10*6/uL — ABNORMAL LOW (ref 3.70–5.45)
RDW: 16.8 % — ABNORMAL HIGH (ref 11.2–14.5)
WBC: 9.4 10*3/uL (ref 3.9–10.3)
lymph#: 1.4 10*3/uL (ref 0.9–3.3)
nRBC: 0 % (ref 0–0)

## 2013-03-19 MED ORDER — ONDANSETRON 16 MG/50ML IVPB (CHCC)
16.0000 mg | Freq: Once | INTRAVENOUS | Status: AC
  Administered 2013-03-19: 16 mg via INTRAVENOUS

## 2013-03-19 MED ORDER — ONDANSETRON 16 MG/50ML IVPB (CHCC)
INTRAVENOUS | Status: AC
  Filled 2013-03-19: qty 16

## 2013-03-19 MED ORDER — HEPARIN SOD (PORK) LOCK FLUSH 100 UNIT/ML IV SOLN
500.0000 [IU] | Freq: Once | INTRAVENOUS | Status: DC | PRN
  Filled 2013-03-19: qty 5

## 2013-03-19 MED ORDER — DEXAMETHASONE SODIUM PHOSPHATE 20 MG/5ML IJ SOLN
INTRAMUSCULAR | Status: AC
  Filled 2013-03-19: qty 5

## 2013-03-19 MED ORDER — ATROPINE SULFATE 1 MG/ML IJ SOLN
0.5000 mg | Freq: Once | INTRAMUSCULAR | Status: AC | PRN
  Administered 2013-03-19: 0.5 mg via INTRAVENOUS

## 2013-03-19 MED ORDER — FLUOROURACIL CHEMO INJECTION 2.5 GM/50ML
400.0000 mg/m2 | Freq: Once | INTRAVENOUS | Status: AC
  Administered 2013-03-19: 800 mg via INTRAVENOUS
  Filled 2013-03-19: qty 16

## 2013-03-19 MED ORDER — LEUCOVORIN CALCIUM INJECTION 350 MG
404.0000 mg/m2 | Freq: Once | INTRAVENOUS | Status: AC
  Administered 2013-03-19: 800 mg via INTRAVENOUS
  Filled 2013-03-19: qty 40

## 2013-03-19 MED ORDER — IRINOTECAN HCL CHEMO INJECTION 100 MG/5ML
125.0000 mg/m2 | Freq: Once | INTRAVENOUS | Status: AC
  Administered 2013-03-19: 248 mg via INTRAVENOUS
  Filled 2013-03-19: qty 12.4

## 2013-03-19 MED ORDER — ATROPINE SULFATE 1 MG/ML IJ SOLN
INTRAMUSCULAR | Status: AC
  Filled 2013-03-19: qty 1

## 2013-03-19 MED ORDER — SODIUM CHLORIDE 0.9 % IV SOLN
2400.0000 mg/m2 | INTRAVENOUS | Status: DC
  Administered 2013-03-19: 4750 mg via INTRAVENOUS
  Filled 2013-03-19: qty 95

## 2013-03-19 MED ORDER — SODIUM CHLORIDE 0.9 % IV SOLN
5.0000 mg/kg | Freq: Once | INTRAVENOUS | Status: AC
  Administered 2013-03-19: 425 mg via INTRAVENOUS
  Filled 2013-03-19: qty 17

## 2013-03-19 MED ORDER — SODIUM CHLORIDE 0.9 % IV SOLN
Freq: Once | INTRAVENOUS | Status: AC
  Administered 2013-03-19: 10:00:00 via INTRAVENOUS

## 2013-03-19 MED ORDER — SODIUM CHLORIDE 0.9 % IJ SOLN
10.0000 mL | INTRAMUSCULAR | Status: DC | PRN
Start: 2013-03-19 — End: 2013-03-19
  Filled 2013-03-19: qty 10

## 2013-03-19 MED ORDER — DEXAMETHASONE SODIUM PHOSPHATE 20 MG/5ML IJ SOLN
20.0000 mg | Freq: Once | INTRAMUSCULAR | Status: AC
  Administered 2013-03-19: 20 mg via INTRAVENOUS

## 2013-03-19 NOTE — Patient Instructions (Signed)
[facility] Discharge Instructions for Patients Receiving Chemotherapy  Today you received the following chemotherapy agents: Avastin, 5FU, Leucovorin, Camptosar  To help prevent nausea and vomiting after your treatment, we encourage you to take your nausea medication as prescribed.   If you develop nausea and vomiting that is not controlled by your nausea medication, call the clinic.   BELOW ARE SYMPTOMS THAT SHOULD BE REPORTED IMMEDIATELY:  *FEVER GREATER THAN 100.5 F  *CHILLS WITH OR WITHOUT FEVER  NAUSEA AND VOMITING THAT IS NOT CONTROLLED WITH YOUR NAUSEA MEDICATION  *UNUSUAL SHORTNESS OF BREATH  *UNUSUAL BRUISING OR BLEEDING  TENDERNESS IN MOUTH AND THROAT WITH OR WITHOUT PRESENCE OF ULCERS  *URINARY PROBLEMS  *BOWEL PROBLEMS  UNUSUAL RASH Items with * indicate a potential emergency and should be followed up as soon as possible.  Feel free to call the clinic you have any questions or concerns. The clinic phone number is [phone].

## 2013-03-19 NOTE — Progress Notes (Signed)
Hematology and Oncology Follow Up Visit  Ashley Davenport [id number] Jul 18, 1930 79 y.o. 03/19/2013 9:09 AM [name], MDClark, Lindell Spar, MD   Principle Diagnosis: 79 year old woman with rectal cancer and anemia of chronic disease secondary to kidney disease. She was diagnosed with rectal cancer on  09/01/2006.  She hadT3 N0 moderately differentiated adenocarcinoma of the rectum with evidence of LV invasion. Now she has stage IV disease with liver mets.    Prior Therapy: She is status post neoadjuvant continuous infusion 5-FU with external radiation therapy between July 10, 2006 and Sep 01, 2006.  She declined abdominoperineal resection. She went on to receive additional 3 cycles of Xeloda, but developed hand-foot syndrome and declined further therapy.   Current therapy: She is on FOLFIRI + Avastin. First cycle given on 6/27 with FOLFIRI alone. Cycle 4 was delayed due to grade 2 fatigue and grade 2-3 diarrhea. She is here for cycle 11 of chemotherapy with dose reduction of her Irinotecan to 125 mg/m2.   Interim History: Ashley Davenport presents today for a follow up visit. She is a very nice women with the above diagnosis. She is here for the next cycle of chemotherapy. Her last chemotherapy given without complications related to her Port-A-Cath. Fatigue is better today. Remains independent in ADLs. Appetite is still decreased, but weight is stable. Besides pain secondary to neuralgia from shingles, she has no abnormal discomfort/pain. Diarrhea is better and controlled with Lomotil. No nausea or vomiting. No chest pain, shortness of breath, or dyspnea. She is reporting more hand-and-foot syndrome manifesting itself with painful palms and soles of her feet. She is not reporting any breaking in the skin integrity. She has not reported any neuropathy or interference of her activity of daily living.  Medications: I have reviewed the patient's current medications.  Current Outpatient  Prescriptions  Medication Sig Dispense Refill  . allopurinol (ZYLOPRIM) 150 mg TABS Take 150 mg by mouth every morning.       Marland Kitchen amLODipine (NORVASC) 5 MG tablet Take 5 mg by mouth every morning.       . Biotin 5000 MCG CAPS Take 5,000 mcg by mouth every morning.       . Coenzyme Q10 (COQ-10) 200 MG CAPS Take 200 mg by mouth at bedtime.       . ferrous sulfate 325 (65 FE) MG tablet Take 325 mg by mouth daily with breakfast.      . furosemide (LASIX) 20 MG tablet Take 20 mg by mouth every morning.       Marland Kitchen levothyroxine (SYNTHROID, LEVOTHROID) 75 MCG tablet Take 75 mcg by mouth every morning.       . lidocaine-prilocaine (EMLA) cream Apply 1 application topically daily as needed (Applies to port-a-cath.).      Marland Kitchen ondansetron (ZOFRAN) 8 MG tablet Take 1 tablet (8 mg total) by mouth every 8 (eight) hours as needed for nausea.  20 tablet  2  . pravastatin (PRAVACHOL) 20 MG tablet Take 20 mg by mouth at bedtime.       Marland Kitchen PRESCRIPTION MEDICATION She receives her treatments at the [facility] with Dr. Clelia Croft. She received Fluorouracil 800mg , Camptosar 248mg , Avastin 425mg  and Leucovorin 792mg  on 01/15/13 and an injection of Neulasta 6mg  on 01/07/13.      . valsartan (DIOVAN) 160 MG tablet Take 160 mg by mouth every morning.        No current facility-administered medications for this visit.   Facility-Administered Medications Ordered in Other Visits  Medication Dose Route Frequency Provider Last Rate Last Dose  . 0.9 %  sodium chloride infusion   Intravenous Once Benjiman Core, MD         Allergies:  Allergies  Allergen Reactions  . Shellfish Allergy Itching and Rash    Feels itching internally.  . Meperidine Hcl     REACTION: Hypotension  . Rosuvastatin Other (See Comments)    "Makes my muscles ache and weak in the legs"  . Tramadol Hcl     REACTION: Tongue and lips swell    Past Medical History, Surgical history, Social history, and Family History were reviewed and  updated.  Review of Systems:  Remaining ROS negative.  Physical Exam: Blood pressure 159/75, pulse 75, temperature 97 F (36.1 C), temperature source Oral, resp. rate 18, height 5\' 4"  (1.626 m), weight 169 lb 14.4 oz (77.066 kg). ECOG: 1 General appearance: alert Head: Normocephalic, without obvious abnormality, atraumatic Neck: no adenopathy, no carotid bruit, no JVD, supple, symmetrical, trachea midline and thyroid not enlarged, symmetric, no tenderness/mass/nodules Lymph nodes: Cervical, supraclavicular, and axillary nodes normal. Heart:regular rate and rhythm, S1, S2 normal, no murmur, click, rub or gallop Lung:chest clear, no wheezing, rales, normal symmetric air entry Abdomen: soft, non-tender, without masses or organomegaly EXT:no erythema, induration, or nodules. No desquamation are cracking of the skin.   Lab Results: Lab Results  Component Value Date   WBC 9.4 03/19/2013   HGB 11.3* 03/19/2013   HCT 34.5* 03/19/2013   MCV 93.8 03/19/2013   PLT 265 03/19/2013     Chemistry      Component Value Date/Time   NA 140 03/04/2013 1408   NA 138 09/18/2012 1355   K 3.7 03/04/2013 1408   K 5.5* 09/18/2012 1355   CL 102 09/25/2012 0933   CL 103 09/18/2012 1355   CO2 26 03/04/2013 1408   CO2 24 09/18/2012 1355   BUN 14.0 03/04/2013 1408   BUN 38* 09/18/2012 1355   CREATININE 1.6* 03/04/2013 1408   CREATININE 2.23* 09/18/2012 1355      Component Value Date/Time   CALCIUM 9.2 03/04/2013 1408   CALCIUM 9.7 09/18/2012 1355   ALKPHOS 77 03/04/2013 1408   ALKPHOS 52 08/30/2011 1107   AST 21 03/04/2013 1408   AST 21 08/30/2011 1107   ALT 10 03/04/2013 1408   ALT 18 08/30/2011 1107   BILITOT 0.34 03/04/2013 1408   BILITOT 0.3 08/30/2011 1107      Impression and Plan:  [name] is a 79 year old woman with:  1. T3 N0 moderately differentiated adenocarcinoma of the rectum with evidence of LV invasion and positive surgical margins 05/30/2006. She is status post neoadjuvant continuous  infusion 5-FU with external radiation therapy between July 10, 2006 and Sep 01, 2006. FOLFIRI with Avastin added with cycle 2. She developed grade 2-3 diarrhea, grade 2 fatigue, and weight loss. CT scan from 12/1/201 showed decrease in size of the dominant hepatic mets. Recommend that she proceed with cycle 11 today. CPT-11 dose has already been reduced. No further dose reduction needed at this time. The plan is to proceed with 2 more months of chemotherapy and potentially switch her to a maintenance phase with 5-FU and a Avastin. She is experiencing more hand and foot syndrome but would like to continue with the same dose and schedule for the time being.  2. Anemia: S/P Feraheme. Anemia and fatigue improving.   3. Neutropenia: Receives Neulasta with each cycle of chemotherapy.  4. Nausea and vomiting prophylaxis:  On Zofran and compazine.   5. HTN: She is on amlodipine and valsartan. She has been holding her BP meds due to being normotensive without them.  6. Follow up: In 3 weeks for her next cycle of chemotherapy.  7. Port-A-Cath managements: Does have been reversed and a new Port-A-Cath was inserted on 01/25/2013.     [facility] 12/16/20149:09 AM

## 2013-03-21 ENCOUNTER — Ambulatory Visit (HOSPITAL_BASED_OUTPATIENT_CLINIC_OR_DEPARTMENT_OTHER): Payer: Medicare Other

## 2013-03-21 VITALS — BP 138/73 | HR 72 | Temp 97.9°F | Resp 20

## 2013-03-21 DIAGNOSIS — Z5189 Encounter for other specified aftercare: Secondary | ICD-10-CM

## 2013-03-21 DIAGNOSIS — C787 Secondary malignant neoplasm of liver and intrahepatic bile duct: Secondary | ICD-10-CM

## 2013-03-21 DIAGNOSIS — C20 Malignant neoplasm of rectum: Secondary | ICD-10-CM

## 2013-03-21 MED ORDER — PEGFILGRASTIM INJECTION 6 MG/0.6ML
6.0000 mg | Freq: Once | SUBCUTANEOUS | Status: AC
Start: 2013-03-21 — End: 2013-03-21
  Administered 2013-03-21: 6 mg via SUBCUTANEOUS
  Filled 2013-03-21: qty 0.6

## 2013-03-21 MED ORDER — HEPARIN SOD (PORK) LOCK FLUSH 100 UNIT/ML IV SOLN
500.0000 [IU] | Freq: Once | INTRAVENOUS | Status: AC | PRN
  Administered 2013-03-21: 500 [IU]
  Filled 2013-03-21: qty 5

## 2013-03-21 MED ORDER — SODIUM CHLORIDE 0.9 % IJ SOLN
10.0000 mL | INTRAMUSCULAR | Status: DC | PRN
  Administered 2013-03-21: 10 mL
  Filled 2013-03-21: qty 10

## 2013-03-21 NOTE — Patient Instructions (Signed)

## 2013-04-03 ENCOUNTER — Other Ambulatory Visit (HOSPITAL_BASED_OUTPATIENT_CLINIC_OR_DEPARTMENT_OTHER): Payer: Medicare Other

## 2013-04-03 ENCOUNTER — Ambulatory Visit (HOSPITAL_BASED_OUTPATIENT_CLINIC_OR_DEPARTMENT_OTHER): Payer: Medicare Other

## 2013-04-03 ENCOUNTER — Telehealth: Payer: Self-pay | Admitting: Oncology

## 2013-04-03 ENCOUNTER — Ambulatory Visit (HOSPITAL_BASED_OUTPATIENT_CLINIC_OR_DEPARTMENT_OTHER): Payer: Medicare Other | Admitting: Oncology

## 2013-04-03 VITALS — BP 415/69 | HR 85 | Temp 98.2°F | Resp 20

## 2013-04-03 VITALS — BP 135/72 | HR 68 | Temp 98.1°F | Resp 20 | Ht 64.0 in | Wt 166.6 lb

## 2013-04-03 DIAGNOSIS — K1370 Unspecified lesions of oral mucosa: Secondary | ICD-10-CM

## 2013-04-03 DIAGNOSIS — C20 Malignant neoplasm of rectum: Secondary | ICD-10-CM

## 2013-04-03 DIAGNOSIS — C787 Secondary malignant neoplasm of liver and intrahepatic bile duct: Secondary | ICD-10-CM

## 2013-04-03 DIAGNOSIS — I10 Essential (primary) hypertension: Secondary | ICD-10-CM

## 2013-04-03 DIAGNOSIS — Z5111 Encounter for antineoplastic chemotherapy: Secondary | ICD-10-CM

## 2013-04-03 DIAGNOSIS — D709 Neutropenia, unspecified: Secondary | ICD-10-CM

## 2013-04-03 DIAGNOSIS — Z5112 Encounter for antineoplastic immunotherapy: Secondary | ICD-10-CM

## 2013-04-03 DIAGNOSIS — D649 Anemia, unspecified: Secondary | ICD-10-CM

## 2013-04-03 LAB — CBC WITH DIFFERENTIAL/PLATELET
BASO%: 0.4 % (ref 0.0–2.0)
Basophils Absolute: 0 10*3/uL (ref 0.0–0.1)
EOS%: 1.4 % (ref 0.0–7.0)
Eosinophils Absolute: 0.1 10*3/uL (ref 0.0–0.5)
HCT: 35.1 % (ref 34.8–46.6)
HGB: 11.4 g/dL — ABNORMAL LOW (ref 11.6–15.9)
LYMPH%: 11.6 % — ABNORMAL LOW (ref 14.0–49.7)
MCHC: 32.5 g/dL (ref 31.5–36.0)
MONO#: 0.5 10*3/uL (ref 0.1–0.9)
NEUT#: 6.9 10*3/uL — ABNORMAL HIGH (ref 1.5–6.5)
NEUT%: 80.7 % — ABNORMAL HIGH (ref 38.4–76.8)
Platelets: 266 10*3/uL (ref 145–400)
WBC: 8.5 10*3/uL (ref 3.9–10.3)
lymph#: 1 10*3/uL (ref 0.9–3.3)

## 2013-04-03 LAB — COMPREHENSIVE METABOLIC PANEL (CC13)
ALT: 12 U/L (ref 0–55)
AST: 15 U/L (ref 5–34)
Albumin: 3.4 g/dL — ABNORMAL LOW (ref 3.5–5.0)
Alkaline Phosphatase: 96 U/L (ref 40–150)
Anion Gap: 14 mEq/L — ABNORMAL HIGH (ref 3–11)
BUN: 21.5 mg/dL (ref 7.0–26.0)
CO2: 21 mEq/L — ABNORMAL LOW (ref 22–29)
Calcium: 8.8 mg/dL (ref 8.4–10.4)
Chloride: 105 mEq/L (ref 98–109)
Creatinine: 1.9 mg/dL — ABNORMAL HIGH (ref 0.6–1.1)
Glucose: 114 mg/dl (ref 70–140)
Potassium: 3.7 mEq/L (ref 3.5–5.1)

## 2013-04-03 MED ORDER — ONDANSETRON 16 MG/50ML IVPB (CHCC)
16.0000 mg | Freq: Once | INTRAVENOUS | Status: AC
  Administered 2013-04-03: 16 mg via INTRAVENOUS

## 2013-04-03 MED ORDER — LEUCOVORIN CALCIUM INJECTION 350 MG
380.0000 mg | Freq: Once | INTRAMUSCULAR | Status: AC
  Administered 2013-04-03: 380 mg via INTRAVENOUS
  Filled 2013-04-03: qty 19

## 2013-04-03 MED ORDER — ONDANSETRON 16 MG/50ML IVPB (CHCC)
INTRAVENOUS | Status: AC
  Filled 2013-04-03: qty 16

## 2013-04-03 MED ORDER — HEPARIN SOD (PORK) LOCK FLUSH 100 UNIT/ML IV SOLN
500.0000 [IU] | Freq: Once | INTRAVENOUS | Status: DC | PRN
  Filled 2013-04-03: qty 5

## 2013-04-03 MED ORDER — FLUOROURACIL CHEMO INJECTION 500 MG/10ML
200.0000 mg/m2 | Freq: Once | INTRAVENOUS | Status: AC
  Administered 2013-04-03: 400 mg via INTRAVENOUS
  Filled 2013-04-03: qty 8

## 2013-04-03 MED ORDER — MAGIC MOUTHWASH: 5.0000 mL | Freq: Three times a day (TID) | ORAL | Status: DC

## 2013-04-03 MED ORDER — DEXAMETHASONE SODIUM PHOSPHATE 20 MG/5ML IJ SOLN
20.0000 mg | Freq: Once | INTRAMUSCULAR | Status: AC
  Administered 2013-04-03: 20 mg via INTRAVENOUS

## 2013-04-03 MED ORDER — SODIUM CHLORIDE 0.9 % IV SOLN
Freq: Once | INTRAVENOUS | Status: AC
  Administered 2013-04-03: 10:00:00 via INTRAVENOUS

## 2013-04-03 MED ORDER — SODIUM CHLORIDE 0.9 % IV SOLN
1200.0000 mg/m2 | INTRAVENOUS | Status: DC
  Administered 2013-04-03: 2400 mg via INTRAVENOUS
  Filled 2013-04-03: qty 48

## 2013-04-03 MED ORDER — SODIUM CHLORIDE 0.9 % IJ SOLN
10.0000 mL | INTRAMUSCULAR | Status: DC | PRN
  Filled 2013-04-03: qty 10

## 2013-04-03 MED ORDER — DEXTROSE 5 % IV SOLN
65.0000 mg/m2 | Freq: Once | INTRAVENOUS | Status: AC
  Administered 2013-04-03: 128 mg via INTRAVENOUS
  Filled 2013-04-03: qty 6.4

## 2013-04-03 MED ORDER — ATROPINE SULFATE 1 MG/ML IJ SOLN
INTRAMUSCULAR | Status: AC
  Filled 2013-04-03: qty 1

## 2013-04-03 MED ORDER — BEVACIZUMAB CHEMO INJECTION 400 MG/16ML
5.0000 mg/kg | Freq: Once | INTRAVENOUS | Status: AC
  Administered 2013-04-03: 425 mg via INTRAVENOUS
  Filled 2013-04-03: qty 17

## 2013-04-03 MED ORDER — DEXAMETHASONE SODIUM PHOSPHATE 20 MG/5ML IJ SOLN
INTRAMUSCULAR | Status: AC
  Filled 2013-04-03: qty 5

## 2013-04-03 MED ORDER — ATROPINE SULFATE 1 MG/ML IJ SOLN
0.5000 mg | Freq: Once | INTRAMUSCULAR | Status: AC | PRN
  Administered 2013-04-03: 0.5 mg via INTRAVENOUS

## 2013-04-03 NOTE — Progress Notes (Signed)
Vital signs stable pre and post Avastin 

## 2013-04-03 NOTE — Telephone Encounter (Signed)
gv and printed appt sched and avs for pt for Jan 2015 °

## 2013-04-03 NOTE — Progress Notes (Signed)
Hematology and Oncology Follow Up Visit  Ashley Davenport [id number] November 04, 1930 79 y.o. 04/03/2013 9:28 AM [name], MDClark, Lindell Spar, MD   Principle Diagnosis: 79 year old woman with rectal cancer and anemia of chronic disease secondary to kidney disease. She was diagnosed with rectal cancer on  09/01/2006.  She hadT3 N0 moderately differentiated adenocarcinoma of the rectum with evidence of LV invasion. Now she has stage IV disease with liver mets.    Prior Therapy: She is status post neoadjuvant continuous infusion 5-FU with external radiation therapy between July 10, 2006 and Sep 01, 2006.  She declined abdominoperineal resection. She went on to receive additional 3 cycles of Xeloda, but developed hand-foot syndrome and declined further therapy.   Current therapy: She is on FOLFIRI + Avastin. First cycle given on 6/27 with FOLFIRI alone. Cycle 4 was delayed due to grade 2 fatigue and grade 2-3 diarrhea. She is here for cycle 12 of chemotherapy with dose reduction of her Irinotecan to 125 mg/m2.   Interim History: Ashley Davenport presents today for a follow up visit. She is a very nice women with the above diagnosis. She is here for the next cycle of chemotherapy. Her last chemotherapy given without complications related to her Port-A-Cath. Fatigue is better today. Remains independent in ADLs. Appetite is still decreased, but weight is stable. She is reporting more hand foot syndrome with increased pain but no skin cracking or desquamation. Diarrhea is is much worse since the last time with frequent loose bowel habits on a daily basis and controlled with Lomotil. No nausea or vomiting. No chest pain, shortness of breath, or dyspnea. She is also reporting symptoms of mouth pain and difficulty swallowing.  Medications: I have reviewed the patient's current medications.     Allergies:  Allergies  Allergen Reactions  . Shellfish Allergy Itching and Rash    Feels itching internally.   . Meperidine Hcl     REACTION: Hypotension  . Rosuvastatin Other (See Comments)    "Makes my muscles ache and weak in the legs"  . Tramadol Hcl     REACTION: Tongue and lips swell    Past Medical History, Surgical history, Social history, and Family History were reviewed and updated.  Review of Systems:  Remaining ROS negative.  Physical Exam: Blood pressure 135/72, pulse 68, temperature 98.1 F (36.7 C), temperature source Oral, resp. rate 20, height 5\' 4"  (1.626 m), weight 166 lb 9.6 oz (75.569 kg). ECOG: 1 General appearance: alert Head: Normocephalic, without obvious abnormality, atraumatic Neck: no adenopathy, no carotid bruit, no JVD, supple, symmetrical, trachea midline and thyroid not enlarged, symmetric, no tenderness/mass/nodules Lymph nodes: Cervical, supraclavicular, and axillary nodes normal. Heart:regular rate and rhythm, S1, S2 normal, no murmur, click, rub or gallop Lung:chest clear, no wheezing, rales, normal symmetric air entry Abdomen: soft, non-tender, without masses or organomegaly EXT:no erythema, induration, or nodules. No desquamation are cracking of the skin.   Lab Results: Lab Results  Component Value Date   WBC 8.5 04/03/2013   HGB 11.4* 04/03/2013   HCT 35.1 04/03/2013   MCV 94.9 04/03/2013   PLT 266 04/03/2013     Chemistry      Component Value Date/Time   NA 140 03/19/2013 0838   NA 138 09/18/2012 1355   K 4.4 03/19/2013 0838   K 5.5* 09/18/2012 1355   CL 102 09/25/2012 0933   CL 103 09/18/2012 1355   CO2 22 03/19/2013 0838   CO2 24 09/18/2012 1355   BUN 18.3  03/19/2013 0838   BUN 38* 09/18/2012 1355   CREATININE 1.6* 03/19/2013 0838   CREATININE 2.23* 09/18/2012 1355      Component Value Date/Time   CALCIUM 9.6 03/19/2013 0838   CALCIUM 9.7 09/18/2012 1355   ALKPHOS 96 03/19/2013 0838   ALKPHOS 52 08/30/2011 1107   AST 18 03/19/2013 0838   AST 21 08/30/2011 1107   ALT 11 03/19/2013 0838   ALT 18 08/30/2011 1107   BILITOT 0.44  03/19/2013 0838   BILITOT 0.3 08/30/2011 1107      Impression and Plan:  Ashley Davenport is a 79 year old woman with:  1. T3 N0 moderately differentiated adenocarcinoma of the rectum with evidence of LV invasion and positive surgical margins 05/30/2006. She is status post neoadjuvant continuous infusion 5-FU with external radiation therapy between July 10, 2006 and Sep 01, 2006. FOLFIRI with Avastin added with cycle 2. She developed grade 2-3 diarrhea, grade 2 fatigue, and weight loss. CT scan from 12/1/201 showed decrease in size of the dominant hepatic mets.   Recommend that she proceed with cycle 12 today with 50% dose reduction. After that, I will suspend chemotherapy temporarily and give her a treatment holiday potentially resume chemotherapy as a maintenance doses after January of 2015.  2. Anemia: S/P Feraheme. Anemia and fatigue improving.   3. Neutropenia: Receives Neulasta with each cycle of chemotherapy.  4. Nausea and vomiting prophylaxis: On Zofran and compazine.   5. HTN: She is on amlodipine and valsartan. She has been holding her BP meds due to being normotensive without them.  6. Follow up: In 3 -4 weeks.   7. Port-A-Cath managements: Does have been reversed and a new Port-A-Cath was inserted on 01/25/2013.  8. Mouth pain: I have given a prescription for Magic mouthwash to use as needed.     Ashley Davenport 12/31/20149:28 AM

## 2013-04-05 ENCOUNTER — Ambulatory Visit (HOSPITAL_BASED_OUTPATIENT_CLINIC_OR_DEPARTMENT_OTHER): Payer: Medicare Other

## 2013-04-05 VITALS — BP 135/79 | HR 69 | Temp 97.0°F

## 2013-04-05 DIAGNOSIS — C20 Malignant neoplasm of rectum: Secondary | ICD-10-CM

## 2013-04-05 DIAGNOSIS — Z5189 Encounter for other specified aftercare: Secondary | ICD-10-CM

## 2013-04-05 DIAGNOSIS — C787 Secondary malignant neoplasm of liver and intrahepatic bile duct: Secondary | ICD-10-CM

## 2013-04-05 MED ORDER — HEPARIN SOD (PORK) LOCK FLUSH 100 UNIT/ML IV SOLN
500.0000 [IU] | Freq: Once | INTRAVENOUS | Status: AC | PRN
  Administered 2013-04-05: 500 [IU]
  Filled 2013-04-05: qty 5

## 2013-04-05 MED ORDER — PEGFILGRASTIM INJECTION 6 MG/0.6ML
6.0000 mg | Freq: Once | SUBCUTANEOUS | Status: AC
  Administered 2013-04-05: 6 mg via SUBCUTANEOUS
  Filled 2013-04-05: qty 0.6

## 2013-04-05 MED ORDER — SODIUM CHLORIDE 0.9 % IJ SOLN
10.0000 mL | INTRAMUSCULAR | Status: DC | PRN
Start: 2013-04-05 — End: 2013-04-05
  Administered 2013-04-05: 10 mL
  Filled 2013-04-05: qty 10

## 2013-04-30 ENCOUNTER — Encounter: Payer: Self-pay | Admitting: Oncology

## 2013-04-30 ENCOUNTER — Ambulatory Visit (HOSPITAL_BASED_OUTPATIENT_CLINIC_OR_DEPARTMENT_OTHER): Payer: Medicare Other | Admitting: Oncology

## 2013-04-30 ENCOUNTER — Other Ambulatory Visit (HOSPITAL_BASED_OUTPATIENT_CLINIC_OR_DEPARTMENT_OTHER): Payer: Medicare Other

## 2013-04-30 ENCOUNTER — Telehealth: Payer: Self-pay | Admitting: Oncology

## 2013-04-30 VITALS — BP 136/80 | HR 96 | Temp 97.5°F | Resp 18 | Ht 64.0 in | Wt 164.6 lb

## 2013-04-30 DIAGNOSIS — R110 Nausea: Secondary | ICD-10-CM

## 2013-04-30 DIAGNOSIS — C20 Malignant neoplasm of rectum: Secondary | ICD-10-CM

## 2013-04-30 DIAGNOSIS — D709 Neutropenia, unspecified: Secondary | ICD-10-CM

## 2013-04-30 DIAGNOSIS — D649 Anemia, unspecified: Secondary | ICD-10-CM

## 2013-04-30 DIAGNOSIS — C787 Secondary malignant neoplasm of liver and intrahepatic bile duct: Secondary | ICD-10-CM

## 2013-04-30 DIAGNOSIS — I10 Essential (primary) hypertension: Secondary | ICD-10-CM

## 2013-04-30 LAB — CBC WITH DIFFERENTIAL/PLATELET
BASO%: 0.7 % (ref 0.0–2.0)
Basophils Absolute: 0.1 10*3/uL (ref 0.0–0.1)
EOS%: 3.1 % (ref 0.0–7.0)
Eosinophils Absolute: 0.2 10*3/uL (ref 0.0–0.5)
HCT: 33 % — ABNORMAL LOW (ref 34.8–46.6)
HGB: 11.1 g/dL — ABNORMAL LOW (ref 11.6–15.9)
LYMPH#: 1 10*3/uL (ref 0.9–3.3)
LYMPH%: 12.7 % — ABNORMAL LOW (ref 14.0–49.7)
MCH: 32.4 pg (ref 25.1–34.0)
MCHC: 33.6 g/dL (ref 31.5–36.0)
MCV: 96.4 fL (ref 79.5–101.0)
MONO#: 0.7 10*3/uL (ref 0.1–0.9)
MONO%: 9 % (ref 0.0–14.0)
NEUT#: 6.1 10*3/uL (ref 1.5–6.5)
NEUT%: 74.5 % (ref 38.4–76.8)
Platelets: 370 10*3/uL (ref 145–400)
RBC: 3.43 10*6/uL — ABNORMAL LOW (ref 3.70–5.45)
RDW: 17.9 % — AB (ref 11.2–14.5)
WBC: 8.1 10*3/uL (ref 3.9–10.3)

## 2013-04-30 LAB — COMPREHENSIVE METABOLIC PANEL (CC13)
ALT: 11 U/L (ref 0–55)
AST: 16 U/L (ref 5–34)
Albumin: 3.5 g/dL (ref 3.5–5.0)
Alkaline Phosphatase: 66 U/L (ref 40–150)
Anion Gap: 12 mEq/L — ABNORMAL HIGH (ref 3–11)
BUN: 19.7 mg/dL (ref 7.0–26.0)
CALCIUM: 9.8 mg/dL (ref 8.4–10.4)
CHLORIDE: 107 meq/L (ref 98–109)
CO2: 24 mEq/L (ref 22–29)
CREATININE: 1.5 mg/dL — AB (ref 0.6–1.1)
Glucose: 128 mg/dl (ref 70–140)
POTASSIUM: 4.5 meq/L (ref 3.5–5.1)
Sodium: 142 mEq/L (ref 136–145)
Total Bilirubin: 0.63 mg/dL (ref 0.20–1.20)
Total Protein: 7.2 g/dL (ref 6.4–8.3)

## 2013-04-30 MED ORDER — CAPECITABINE 500 MG PO TABS: ORAL_TABLET | ORAL | Status: DC

## 2013-04-30 NOTE — Progress Notes (Signed)
Hematology and Oncology Follow Up Visit  SUSIE EHRESMAN [id number] 04-Mar-1931 80 y.o. 04/30/2013 9:54 AM [name], MDClark, Don Broach, MD   Principle Diagnosis: 80 year old woman with rectal cancer and anemia of chronic disease secondary to kidney disease. She was diagnosed with rectal cancer on  09/01/2006.  She hadT3 N0 moderately differentiated adenocarcinoma of the rectum with evidence of LV invasion. Now she has stage IV disease with liver mets.    Prior Therapy: She is status post neoadjuvant continuous infusion 5-FU with external radiation therapy between July 10, 2006 and Sep 01, 2006.  She declined abdominoperineal resection. She went on to receive additional 3 cycles of Xeloda, but developed hand-foot syndrome and declined further therapy.   Current therapy: She is on FOLFIRI + Avastin. First cycle given on 6/27 with FOLFIRI alone. Cycle 4 was delayed due to grade 2 fatigue and grade 2-3 diarrhea. She is status post 12 cycles completed in December of 2014. She is about to start maintenance chemotherapy with oral Xeloda.  Interim History: Mrs. Mauceri presents today for a follow up visit. She is a very nice women with the above diagnosis. She is here for an evaluation before the start of maintenance chemotherapy. Her last chemotherapy given without complications related to her Port-A-Cath. Fatigue is better today. Remains independent in ADLs. Appetite is improving slowly but weight is stable. She is reporting no hand foot syndrome without increased pain and no skin cracking or desquamation. Diarrhea is controlled with Lomotil. No nausea or vomiting. No chest pain, shortness of breath, or dyspnea. She is also reporting symptoms of mouth pain and difficulty swallowing.  Medications: I have reviewed the patient's current medications.     Allergies:  Allergies  Allergen Reactions  . Shellfish Allergy Itching and Rash    Feels itching internally.  . Meperidine Hcl    REACTION: Hypotension  . Rosuvastatin Other (See Comments)    "Makes my muscles ache and weak in the legs"  . Tramadol Hcl     REACTION: Tongue and lips swell    Past Medical History, Surgical history, Social history, and Family History were reviewed and updated.  Review of Systems:  Remaining ROS negative.  Physical Exam: Blood pressure 136/80, pulse 96, temperature 97.5 F (36.4 C), temperature source Oral, resp. rate 18, height 5\' 4"  (1.626 m), weight 164 lb 9.6 oz (74.662 kg). ECOG: 1 General appearance: alert Head: Normocephalic, without obvious abnormality, atraumatic Neck: no adenopathy, no carotid bruit, no JVD, supple, symmetrical, trachea midline and thyroid not enlarged, symmetric, no tenderness/mass/nodules Lymph nodes: Cervical, supraclavicular, and axillary nodes normal. Heart:regular rate and rhythm, S1, S2 normal, no murmur, click, rub or gallop Lung:chest clear, no wheezing, rales, normal symmetric air entry Abdomen: soft, non-tender, without masses or organomegaly EXT:no erythema, induration, or nodules. No desquamation are cracking of the skin.   Lab Results: Lab Results  Component Value Date   WBC 8.1 04/30/2013   HGB 11.1* 04/30/2013   HCT 33.0* 04/30/2013   MCV 96.4 04/30/2013   PLT 370 04/30/2013     Chemistry      Component Value Date/Time   NA 141 04/03/2013 0846   NA 138 09/18/2012 1355   K 3.7 04/03/2013 0846   K 5.5* 09/18/2012 1355   CL 102 09/25/2012 0933   CL 103 09/18/2012 1355   CO2 21* 04/03/2013 0846   CO2 24 09/18/2012 1355   BUN 21.5 04/03/2013 0846   BUN 38* 09/18/2012 1355   CREATININE 1.9* 04/03/2013  7494   CREATININE 2.23* 09/18/2012 1355      Component Value Date/Time   CALCIUM 8.8 04/03/2013 0846   CALCIUM 9.7 09/18/2012 1355   ALKPHOS 96 04/03/2013 0846   ALKPHOS 52 08/30/2011 1107   AST 15 04/03/2013 0846   AST 21 08/30/2011 1107   ALT 12 04/03/2013 0846   ALT 18 08/30/2011 1107   BILITOT 0.41 04/03/2013 0846   BILITOT 0.3  08/30/2011 1107      Impression and Plan:  Mrs. Dobratz is a 80 year old woman with:  1. T3 N0 moderately differentiated adenocarcinoma of the rectum with evidence of LV invasion and positive surgical margins 05/30/2006. She is status post neoadjuvant continuous infusion 5-FU with external radiation therapy between July 10, 2006 and Sep 01, 2006. FOLFIRI with Avastin added with cycle 2. She developed grade 2-3 diarrhea, grade 2 fatigue, and weight loss. CT scan from 12/1/201 showed decrease in size of the dominant hepatic mets.   Options of treatment discussed today including observation and surveillance, maintenance chemotherapy with IV versus oral medication and resumption of full dose chemotherapy. We have elected to proceed with oral Xeloda for a total of 1 g twice a day 14 days on and one-week off. And we had a Avastin at a later date. Risks and benefits of this treatment were discussed and she is willing to proceed.  2. Anemia: S/P Feraheme. Anemia and fatigue improving.   3. Neutropenia: Receives Neulasta with each cycle of chemotherapy.  4. Nausea and vomiting prophylaxis: On Zofran and compazine.   5. HTN: She is on amlodipine and valsartan. She has been holding her BP meds due to being normotensive without them.  6. Port-A-Cath management: She'll have a Port-A-Cath flush every 6-8 weeks.  7. Followup: Every 3-4 weeks while she is on maintenance Zoladex.     Laneshia Pina 1/27/20159:54 AM

## 2013-04-30 NOTE — Telephone Encounter (Signed)
Gave pt appt for lab ,Ml and flush for February 2015

## 2013-04-30 NOTE — Progress Notes (Signed)
Faxed xeloda prescription to [facility] °

## 2013-05-06 ENCOUNTER — Encounter: Payer: Self-pay | Admitting: Oncology

## 2013-05-06 NOTE — Progress Notes (Signed)
Received letter from Patient [employer].  Pt is approved for Xeloda effective 05/02/13 to 05/01/14 or when benefit cap has been met.  Expenses can be submitted for reimbursement for dos 02/01/13 to 05/01/14.  The amount of the grant is $7500.

## 2013-05-06 NOTE — Progress Notes (Signed)
RECEIVED A FAX FROM [facility] CONCERNING A CONFIRMATION OF PRESCRIPTION SHIPMENT FOR CAPECITABINE ON 05/03/13.

## 2013-05-15 ENCOUNTER — Other Ambulatory Visit: Payer: Self-pay

## 2013-05-15 DIAGNOSIS — Z1231 Encounter for screening mammogram for malignant neoplasm of breast: Secondary | ICD-10-CM

## 2013-05-22 ENCOUNTER — Telehealth: Payer: Self-pay | Admitting: Oncology

## 2013-05-22 NOTE — Telephone Encounter (Signed)
Pt called and r/s lab and MD for end of February 2015

## 2013-05-24 ENCOUNTER — Other Ambulatory Visit: Payer: Medicare Other

## 2013-05-24 ENCOUNTER — Ambulatory Visit: Payer: Medicare Other | Admitting: Oncology

## 2013-05-29 ENCOUNTER — Ambulatory Visit
Admission: RE | Admit: 2013-05-29 | Discharge: 2013-05-29 | Disposition: A | Discharge status: Home / Self Care | Payer: Medicare Other | Source: Ambulatory Visit

## 2013-05-29 DIAGNOSIS — Z1231 Encounter for screening mammogram for malignant neoplasm of breast: Secondary | ICD-10-CM

## 2013-05-30 ENCOUNTER — Other Ambulatory Visit: Payer: Self-pay | Admitting: Vascular Surgery

## 2013-05-30 DIAGNOSIS — I714 Abdominal aortic aneurysm, without rupture, unspecified: Secondary | ICD-10-CM

## 2013-05-30 DIAGNOSIS — Z48812 Encounter for surgical aftercare following surgery on the circulatory system: Secondary | ICD-10-CM

## 2013-05-31 ENCOUNTER — Ambulatory Visit (HOSPITAL_BASED_OUTPATIENT_CLINIC_OR_DEPARTMENT_OTHER): Payer: Medicare Other

## 2013-05-31 ENCOUNTER — Other Ambulatory Visit: Payer: Self-pay | Admitting: Internal Medicine

## 2013-05-31 ENCOUNTER — Other Ambulatory Visit (HOSPITAL_BASED_OUTPATIENT_CLINIC_OR_DEPARTMENT_OTHER): Payer: Medicare Other

## 2013-05-31 ENCOUNTER — Telehealth: Payer: Self-pay | Admitting: Oncology

## 2013-05-31 ENCOUNTER — Encounter: Payer: Self-pay | Admitting: Oncology

## 2013-05-31 ENCOUNTER — Other Ambulatory Visit: Payer: Self-pay | Admitting: *Deleted

## 2013-05-31 ENCOUNTER — Ambulatory Visit (HOSPITAL_BASED_OUTPATIENT_CLINIC_OR_DEPARTMENT_OTHER): Payer: Medicare Other | Admitting: Oncology

## 2013-05-31 VITALS — BP 162/80 | HR 89 | Temp 97.5°F | Resp 18 | Ht 64.0 in | Wt 168.6 lb

## 2013-05-31 DIAGNOSIS — C787 Secondary malignant neoplasm of liver and intrahepatic bile duct: Secondary | ICD-10-CM

## 2013-05-31 DIAGNOSIS — I10 Essential (primary) hypertension: Secondary | ICD-10-CM

## 2013-05-31 DIAGNOSIS — C20 Malignant neoplasm of rectum: Secondary | ICD-10-CM

## 2013-05-31 DIAGNOSIS — R110 Nausea: Secondary | ICD-10-CM

## 2013-05-31 DIAGNOSIS — R928 Other abnormal and inconclusive findings on diagnostic imaging of breast: Secondary | ICD-10-CM

## 2013-05-31 DIAGNOSIS — D649 Anemia, unspecified: Secondary | ICD-10-CM

## 2013-05-31 LAB — COMPREHENSIVE METABOLIC PANEL (CC13)
ALK PHOS: 61 U/L (ref 40–150)
ALT: 8 U/L (ref 0–55)
AST: 19 U/L (ref 5–34)
Albumin: 3.5 g/dL (ref 3.5–5.0)
Anion Gap: 11 mEq/L (ref 3–11)
BUN: 19.6 mg/dL (ref 7.0–26.0)
CO2: 22 mEq/L (ref 22–29)
Calcium: 9.8 mg/dL (ref 8.4–10.4)
Chloride: 109 mEq/L (ref 98–109)
Creatinine: 1.6 mg/dL — ABNORMAL HIGH (ref 0.6–1.1)
Glucose: 112 mg/dl (ref 70–140)
Potassium: 4.1 mEq/L (ref 3.5–5.1)
Sodium: 142 mEq/L (ref 136–145)
Total Bilirubin: 0.43 mg/dL (ref 0.20–1.20)
Total Protein: 7.2 g/dL (ref 6.4–8.3)

## 2013-05-31 LAB — CBC WITH DIFFERENTIAL/PLATELET
BASO%: 0.4 % (ref 0.0–2.0)
BASOS ABS: 0 10*3/uL (ref 0.0–0.1)
EOS%: 3.2 % (ref 0.0–7.0)
Eosinophils Absolute: 0.2 10*3/uL (ref 0.0–0.5)
HCT: 35.7 % (ref 34.8–46.6)
HEMOGLOBIN: 11.7 g/dL (ref 11.6–15.9)
LYMPH%: 14 % (ref 14.0–49.7)
MCH: 31.9 pg (ref 25.1–34.0)
MCHC: 32.7 g/dL (ref 31.5–36.0)
MCV: 97.6 fL (ref 79.5–101.0)
MONO#: 0.5 10*3/uL (ref 0.1–0.9)
MONO%: 7.5 % (ref 0.0–14.0)
NEUT%: 74.9 % (ref 38.4–76.8)
NEUTROS ABS: 4.8 10*3/uL (ref 1.5–6.5)
PLATELETS: 287 10*3/uL (ref 145–400)
RBC: 3.65 10*6/uL — AB (ref 3.70–5.45)
RDW: 17.7 % — AB (ref 11.2–14.5)
WBC: 6.5 10*3/uL (ref 3.9–10.3)
lymph#: 0.9 10*3/uL (ref 0.9–3.3)

## 2013-05-31 MED ORDER — CAPECITABINE 500 MG PO TABS: ORAL_TABLET | ORAL | Status: DC

## 2013-05-31 MED ORDER — SODIUM CHLORIDE 0.9 % IJ SOLN
10.0000 mL | INTRAMUSCULAR | Status: AC | PRN
  Administered 2013-05-31: 10 mL via INTRAVENOUS
  Filled 2013-05-31: qty 10

## 2013-05-31 MED ORDER — HEPARIN SOD (PORK) LOCK FLUSH 100 UNIT/ML IV SOLN
500.0000 [IU] | Freq: Once | INTRAVENOUS | Status: AC
  Administered 2013-05-31: 500 [IU] via INTRAVENOUS
  Filled 2013-05-31: qty 5

## 2013-05-31 NOTE — Telephone Encounter (Signed)
Faxed refill script for xeloda to [facility], [phone], per kristin curcio NP.

## 2013-05-31 NOTE — Telephone Encounter (Signed)
gv and printed appt sched and avs for pt for March....done

## 2013-05-31 NOTE — Progress Notes (Signed)
Hematology and Oncology Follow Up Visit  Ashley Davenport [id number] Jul 25, 1930 80 y.o. 05/31/2013 1:09 PM [name], MDClark, Don Broach, MD   Principle Diagnosis: 80 year old woman with rectal cancer and anemia of chronic disease secondary to kidney disease. She was diagnosed with rectal cancer on  09/01/2006.  She hadT3 N0 moderately differentiated adenocarcinoma of the rectum with evidence of LV invasion. Now she has stage IV disease with liver mets.    Prior Therapy: She is status post neoadjuvant continuous infusion 5-FU with external radiation therapy between July 10, 2006 and Sep 01, 2006.  She declined abdominoperineal resection. She went on to receive additional 3 cycles of Xeloda, but developed hand-foot syndrome and declined further therapy.  She received FOLFIRI + Avastin. First cycle given on 09/28/12 with FOLFIRI alone. Cycle 4 was delayed due to grade 2 fatigue and grade 2-3 diarrhea. She is status post 12 cycles completed in December of 2014.   Current therapy: She started maintenance chemotherapy with oral Xeloda in 05/2013. She receives 1000 mg BID 2 weeks on and 1 week off.   Interim History: Ashley Davenport presents today for a follow up visit. She is a very nice women with the above diagnosis. She is doing well on [place]. Fatigue is better today. Remains independent in ADLs. Appetite is improving and she is slowly gaining weight. She is reporting no hand foot syndrome without increased pain and no skin cracking or desquamation. No nausea. Vomiting, or diarrhea. No chest pain, shortness of breath, or dyspnea.   Medications: I have reviewed the patient's current medications.     Allergies:  Allergies  Allergen Reactions  . Shellfish Allergy Itching and Rash    Feels itching internally.  . Meperidine Hcl     REACTION: Hypotension  . Rosuvastatin Other (See Comments)    "Makes my muscles ache and weak in the legs"  . Tramadol Hcl     REACTION: Tongue and lips  swell    Past Medical History, Surgical history, Social history, and Family History were reviewed and updated.  Review of Systems:  Remaining ROS negative.  Physical Exam: Blood pressure 162/80, pulse 89, temperature 97.5 F (36.4 C), temperature source Oral, resp. rate 18, height 5\' 4"  (1.626 m), weight 168 lb 9.6 oz (76.476 kg), SpO2 98.00%. ECOG: 1 General appearance: alert Head: Normocephalic, without obvious abnormality, atraumatic Neck: no adenopathy, no carotid bruit, no JVD, supple, symmetrical, trachea midline and thyroid not enlarged, symmetric, no tenderness/mass/nodules Lymph nodes: Cervical, supraclavicular, and axillary nodes normal. Heart:regular rate and rhythm, S1, S2 normal, no murmur, click, rub or gallop Lung:chest clear, no wheezing, rales, normal symmetric air entry Abdomen: soft, non-tender, without masses or organomegaly EXT:no erythema, induration, or nodules. No desquamation or cracking of the skin.   Lab Results: Lab Results  Component Value Date   WBC 6.5 05/31/2013   HGB 11.7 05/31/2013   HCT 35.7 05/31/2013   MCV 97.6 05/31/2013   PLT 287 05/31/2013     Chemistry      Component Value Date/Time   NA 142 05/31/2013 0942   NA 138 09/18/2012 1355   K 4.1 05/31/2013 0942   K 5.5* 09/18/2012 1355   CL 102 09/25/2012 0933   CL 103 09/18/2012 1355   CO2 22 05/31/2013 0942   CO2 24 09/18/2012 1355   BUN 19.6 05/31/2013 0942   BUN 38* 09/18/2012 1355   CREATININE 1.6* 05/31/2013 0942   CREATININE 2.23* 09/18/2012 1355      Component  Value Date/Time   CALCIUM 9.8 05/31/2013 0942   CALCIUM 9.7 09/18/2012 1355   ALKPHOS 61 05/31/2013 0942   ALKPHOS 52 08/30/2011 1107   AST 19 05/31/2013 0942   AST 21 08/30/2011 1107   ALT 8 05/31/2013 0942   ALT 18 08/30/2011 1107   BILITOT 0.43 05/31/2013 0942   BILITOT 0.3 08/30/2011 1107      Impression and Plan:  Ashley Davenport is a 80 year old woman with:  1. T3 N0 moderately differentiated adenocarcinoma of the rectum with  evidence of LV invasion and positive surgical margins 05/30/2006. She is status post neoadjuvant continuous infusion 5-FU with external radiation therapy between July 10, 2006 and Sep 01, 2006. FOLFIRI with Avastin added with cycle 2. She developed grade 2-3 diarrhea, grade 2 fatigue, and weight loss. CT scan from 12/1/201 showed decrease in size of the dominant hepatic mets. She is now on Xeloda 1000 mg BID 2 weeks on and 1 week off. Tolerating well. Recommend that she continue Xeolda without dose modification.  2. Anemia: S/P Feraheme. Anemia has resolved.   3. Nausea and vomiting prophylaxis: On Zofran and compazine.   4. HTN: She is on amlodipine and valsartan.   5. Port-A-Cath management: She will have a Port-A-Cath flush every 6-8 weeks.  6. Followup: Every 3-4 weeks while she is on maintenance Xeolda.     Mikey Bussing 2/27/20151:09 PM

## 2013-06-12 ENCOUNTER — Ambulatory Visit
Admission: RE | Admit: 2013-06-12 | Discharge: 2013-06-12 | Disposition: A | Discharge status: Home / Self Care | Payer: Medicare Other | Source: Ambulatory Visit | Attending: Internal Medicine | Admitting: Internal Medicine

## 2013-06-12 DIAGNOSIS — R928 Other abnormal and inconclusive findings on diagnostic imaging of breast: Secondary | ICD-10-CM

## 2013-06-28 ENCOUNTER — Telehealth: Payer: Self-pay | Admitting: Oncology

## 2013-06-28 ENCOUNTER — Encounter: Payer: Self-pay | Admitting: Oncology

## 2013-06-28 ENCOUNTER — Other Ambulatory Visit (HOSPITAL_BASED_OUTPATIENT_CLINIC_OR_DEPARTMENT_OTHER): Payer: Medicare Other

## 2013-06-28 ENCOUNTER — Ambulatory Visit (HOSPITAL_BASED_OUTPATIENT_CLINIC_OR_DEPARTMENT_OTHER): Payer: Medicare Other | Admitting: Oncology

## 2013-06-28 VITALS — BP 154/89 | HR 72 | Temp 97.3°F | Resp 18 | Wt 171.3 lb

## 2013-06-28 DIAGNOSIS — C20 Malignant neoplasm of rectum: Secondary | ICD-10-CM

## 2013-06-28 DIAGNOSIS — I10 Essential (primary) hypertension: Secondary | ICD-10-CM

## 2013-06-28 DIAGNOSIS — C787 Secondary malignant neoplasm of liver and intrahepatic bile duct: Secondary | ICD-10-CM

## 2013-06-28 LAB — COMPREHENSIVE METABOLIC PANEL (CC13)
ALBUMIN: 3.6 g/dL (ref 3.5–5.0)
ALT: 9 U/L (ref 0–55)
ANION GAP: 11 meq/L (ref 3–11)
AST: 17 U/L (ref 5–34)
Alkaline Phosphatase: 54 U/L (ref 40–150)
BUN: 30.1 mg/dL — AB (ref 7.0–26.0)
CALCIUM: 9.8 mg/dL (ref 8.4–10.4)
CHLORIDE: 107 meq/L (ref 98–109)
CO2: 21 mEq/L — ABNORMAL LOW (ref 22–29)
Creatinine: 1.9 mg/dL — ABNORMAL HIGH (ref 0.6–1.1)
GLUCOSE: 121 mg/dL (ref 70–140)
Potassium: 4.5 mEq/L (ref 3.5–5.1)
SODIUM: 139 meq/L (ref 136–145)
TOTAL PROTEIN: 7.2 g/dL (ref 6.4–8.3)
Total Bilirubin: 0.39 mg/dL (ref 0.20–1.20)

## 2013-06-28 LAB — CBC WITH DIFFERENTIAL/PLATELET
BASO%: 0.5 % (ref 0.0–2.0)
Basophils Absolute: 0 10*3/uL (ref 0.0–0.1)
EOS ABS: 0.2 10*3/uL (ref 0.0–0.5)
EOS%: 3.4 % (ref 0.0–7.0)
HEMATOCRIT: 35.4 % (ref 34.8–46.6)
HEMOGLOBIN: 11.7 g/dL (ref 11.6–15.9)
LYMPH%: 17.7 % (ref 14.0–49.7)
MCH: 32.3 pg (ref 25.1–34.0)
MCHC: 32.9 g/dL (ref 31.5–36.0)
MCV: 98.2 fL (ref 79.5–101.0)
MONO#: 0.5 10*3/uL (ref 0.1–0.9)
MONO%: 10 % (ref 0.0–14.0)
NEUT%: 68.4 % (ref 38.4–76.8)
NEUTROS ABS: 3.7 10*3/uL (ref 1.5–6.5)
PLATELETS: 332 10*3/uL (ref 145–400)
RBC: 3.61 10*6/uL — ABNORMAL LOW (ref 3.70–5.45)
RDW: 17.6 % — ABNORMAL HIGH (ref 11.2–14.5)
WBC: 5.4 10*3/uL (ref 3.9–10.3)
lymph#: 1 10*3/uL (ref 0.9–3.3)

## 2013-06-28 NOTE — Telephone Encounter (Signed)
gv and printed appt sched and avs for pt for May....gv pt barium

## 2013-06-28 NOTE — Progress Notes (Signed)
Hematology and Oncology Follow Up Visit  Ashley Davenport [id number] 1931-04-01 80 y.o. 06/28/2013 9:33 AM [name], MDClark, Don Broach, MD   Principle Diagnosis: 80 year old woman with rectal cancer and anemia of chronic disease secondary to kidney disease. She was diagnosed with rectal cancer on  09/01/2006.  She hadT3 N0 moderately differentiated adenocarcinoma of the rectum with evidence of LV invasion. Now she has stage IV disease with liver mets.    Prior Therapy: She is status post neoadjuvant continuous infusion 5-FU with external radiation therapy between July 10, 2006 and Sep 01, 2006.  She declined abdominoperineal resection. She went on to receive additional 3 cycles of Xeloda, but developed hand-foot syndrome and declined further therapy.  She received FOLFIRI + Avastin. First cycle given on 09/28/12 with FOLFIRI alone. Cycle 4 was delayed due to grade 2 fatigue and grade 2-3 diarrhea. She is status post 12 cycles completed in December of 2014.   Current therapy: She started maintenance chemotherapy with oral Xeloda in 05/2013. She receives 1000 mg BID 2 weeks on and 1 week off.   Interim History: [name] presents today for a follow up visit. She is a very nice women with the above diagnosis. She is doing well on [place]. Fatigue is better today. Remains independent in ADLs. Appetite is improving and she is slowly gaining weight. She is reporting no hand foot syndrome without increased pain and no skin cracking or desquamation. No nausea. Vomiting, or diarrhea. No chest pain, shortness of breath, or dyspnea. She continued to have excellent quality of life and have regained most activities of daily living. She has not reported any genitourinary or gastrointestinal symptoms. She has not reported any neurological symptoms or cardiovascular symptoms. She has not reported any chest pain difficulty breathing cough or hemoptysis or hematemesis.  Medications: I have reviewed the  patient's current medications.     Allergies:  Allergies  Allergen Reactions  . Shellfish Allergy Itching and Rash    Feels itching internally.  . Meperidine Hcl     REACTION: Hypotension  . Rosuvastatin Other (See Comments)    "Makes my muscles ache and weak in the legs"  . Tramadol Hcl     REACTION: Tongue and lips swell    Past Medical History, Surgical history, Social history, and Family History were reviewed and updated.  Review of Systems:  Remaining ROS negative.  Physical Exam: There were no vitals taken for this visit. ECOG: 1 General appearance: alert Head: Normocephalic, without obvious abnormality, atraumatic Neck: no adenopathy, no carotid bruit, no JVD, supple, symmetrical, trachea midline and thyroid not enlarged, symmetric, no tenderness/mass/nodules Lymph nodes: Cervical, supraclavicular, and axillary nodes normal. Heart:regular rate and rhythm, S1, S2 normal, no murmur, click, rub or gallop Lung:chest clear, no wheezing, rales, normal symmetric air entry Abdomen: soft, non-tender, without masses or organomegaly EXT:no erythema, induration, or nodules. No desquamation or cracking of the skin.   Lab Results: Lab Results  Component Value Date   WBC 5.4 06/28/2013   HGB 11.7 06/28/2013   HCT 35.4 06/28/2013   MCV 98.2 06/28/2013   PLT 332 06/28/2013     Chemistry      Component Value Date/Time   NA 139 06/28/2013 0848   NA 138 09/18/2012 1355   K 4.5 06/28/2013 0848   K 5.5* 09/18/2012 1355   CL 102 09/25/2012 0933   CL 103 09/18/2012 1355   CO2 21* 06/28/2013 0848   CO2 24 09/18/2012 1355   BUN 30.1*  06/28/2013 0848   BUN 38* 09/18/2012 1355   CREATININE 1.9* 06/28/2013 0848   CREATININE 2.23* 09/18/2012 1355      Component Value Date/Time   CALCIUM 9.8 06/28/2013 0848   CALCIUM 9.7 09/18/2012 1355   ALKPHOS 54 06/28/2013 0848   ALKPHOS 52 08/30/2011 1107   AST 17 06/28/2013 0848   AST 21 08/30/2011 1107   ALT 9 06/28/2013 0848   ALT 18 08/30/2011 1107    BILITOT 0.39 06/28/2013 0848   BILITOT 0.3 08/30/2011 1107      Impression and Plan:  [name] is a 80 year old woman with:  1. T3 N0 moderately differentiated adenocarcinoma of the rectum with evidence of LV invasion and positive surgical margins 05/30/2006. She is status post neoadjuvant continuous infusion 5-FU with external radiation therapy between July 10, 2006 and Sep 01, 2006. FOLFIRI with Avastin added with cycle 2. She developed grade 2-3 diarrhea, grade 2 fatigue, and weight loss. CT scan from 12/1/201 showed decrease in size of the dominant hepatic mets. She is now on Xeloda 1000 mg BID 2 weeks on and 1 week off. Tolerating well. Recommend that she continue Xeolda without dose modification. I will repeat a CT scan in May of 2015  2. Anemia: S/P Feraheme. Anemia has resolved.   3. Nausea and vomiting prophylaxis: On Zofran and compazine.   4. HTN: She is on amlodipine and valsartan.   5. Port-A-Cath management: She will have a Port-A-Cath flush every 6-8 weeks.  6. Followup: In May of 2015 after his CT scan.     ZJIRCV,ELFYB 3/27/20159:33 AM

## 2013-07-23 ENCOUNTER — Other Ambulatory Visit: Payer: Self-pay | Admitting: *Deleted

## 2013-07-23 DIAGNOSIS — C20 Malignant neoplasm of rectum: Secondary | ICD-10-CM

## 2013-07-23 MED ORDER — CAPECITABINE 500 MG PO TABS: ORAL_TABLET | ORAL | Status: DC

## 2013-07-23 NOTE — Telephone Encounter (Signed)
THIS REFILL REQUEST FOR CAPECITABINE WAS PLACED ON DR.SHADAD'S DESK. 

## 2013-07-26 NOTE — Telephone Encounter (Signed)
RECEIVED A FAX FROM [facility] CONCERNING A CONFIRMATION OF PRESCRIPTION SHIPMENT FOR CAPECITABINE ON 07/25/13. 

## 2013-08-06 ENCOUNTER — Ambulatory Visit (HOSPITAL_COMMUNITY)
Admission: RE | Admit: 2013-08-06 | Discharge: 2013-08-06 | Disposition: A | Discharge status: Home / Self Care | Payer: Medicare Other | Source: Ambulatory Visit | Attending: Oncology | Admitting: Oncology

## 2013-08-06 ENCOUNTER — Encounter (HOSPITAL_COMMUNITY): Payer: Self-pay

## 2013-08-06 ENCOUNTER — Ambulatory Visit: Payer: Medicare Other

## 2013-08-06 ENCOUNTER — Other Ambulatory Visit (HOSPITAL_BASED_OUTPATIENT_CLINIC_OR_DEPARTMENT_OTHER): Payer: Medicare Other

## 2013-08-06 VITALS — BP 125/68 | HR 69 | Temp 97.1°F

## 2013-08-06 DIAGNOSIS — Z95828 Presence of other vascular implants and grafts: Secondary | ICD-10-CM

## 2013-08-06 DIAGNOSIS — I10 Essential (primary) hypertension: Secondary | ICD-10-CM

## 2013-08-06 DIAGNOSIS — C787 Secondary malignant neoplasm of liver and intrahepatic bile duct: Secondary | ICD-10-CM

## 2013-08-06 DIAGNOSIS — C20 Malignant neoplasm of rectum: Secondary | ICD-10-CM

## 2013-08-06 LAB — CBC WITH DIFFERENTIAL/PLATELET
BASO%: 0.5 % (ref 0.0–2.0)
BASOS ABS: 0 10*3/uL (ref 0.0–0.1)
EOS%: 3.1 % (ref 0.0–7.0)
Eosinophils Absolute: 0.2 10*3/uL (ref 0.0–0.5)
HCT: 34.3 % — ABNORMAL LOW (ref 34.8–46.6)
HGB: 11.4 g/dL — ABNORMAL LOW (ref 11.6–15.9)
LYMPH#: 0.8 10*3/uL — AB (ref 0.9–3.3)
LYMPH%: 14.5 % (ref 14.0–49.7)
MCH: 32.8 pg (ref 25.1–34.0)
MCHC: 33.1 g/dL (ref 31.5–36.0)
MCV: 99 fL (ref 79.5–101.0)
MONO#: 0.4 10*3/uL (ref 0.1–0.9)
MONO%: 7.9 % (ref 0.0–14.0)
NEUT%: 74 % (ref 38.4–76.8)
NEUTROS ABS: 4.1 10*3/uL (ref 1.5–6.5)
Platelets: 278 10*3/uL (ref 145–400)
RBC: 3.47 10*6/uL — ABNORMAL LOW (ref 3.70–5.45)
RDW: 17.9 % — ABNORMAL HIGH (ref 11.2–14.5)
WBC: 5.5 10*3/uL (ref 3.9–10.3)

## 2013-08-06 LAB — COMPREHENSIVE METABOLIC PANEL (CC13)
ALT: <6 U/L (ref 0–55)
AST: 16 U/L (ref 5–34)
Albumin: 3.6 g/dL (ref 3.5–5.0)
Alkaline Phosphatase: 48 U/L (ref 40–150)
Anion Gap: 14 mEq/L — ABNORMAL HIGH (ref 3–11)
BUN: 39.6 mg/dL — ABNORMAL HIGH (ref 7.0–26.0)
CALCIUM: 9.8 mg/dL (ref 8.4–10.4)
CO2: 19 mEq/L — ABNORMAL LOW (ref 22–29)
CREATININE: 2.3 mg/dL — AB (ref 0.6–1.1)
Chloride: 106 mEq/L (ref 98–109)
Glucose: 136 mg/dl (ref 70–140)
Potassium: 4.4 mEq/L (ref 3.5–5.1)
Sodium: 138 mEq/L (ref 136–145)
Total Bilirubin: 0.51 mg/dL (ref 0.20–1.20)
Total Protein: 7.1 g/dL (ref 6.4–8.3)

## 2013-08-06 LAB — CEA: CEA: 4.5 ng/mL (ref 0.0–5.0)

## 2013-08-06 MED ORDER — SODIUM CHLORIDE 0.9 % IJ SOLN
10.0000 mL | INTRAMUSCULAR | Status: DC | PRN
  Administered 2013-08-06: 10 mL via INTRAVENOUS
  Filled 2013-08-06: qty 10

## 2013-08-06 MED ORDER — HEPARIN SOD (PORK) LOCK FLUSH 100 UNIT/ML IV SOLN
500.0000 [IU] | Freq: Once | INTRAVENOUS | Status: AC
  Administered 2013-08-06: 500 [IU] via INTRAVENOUS
  Filled 2013-08-06: qty 5

## 2013-08-06 NOTE — Patient Instructions (Signed)

## 2013-08-13 ENCOUNTER — Encounter: Payer: Self-pay | Admitting: Oncology

## 2013-08-13 ENCOUNTER — Telehealth: Payer: Self-pay | Admitting: Oncology

## 2013-08-13 ENCOUNTER — Ambulatory Visit (HOSPITAL_BASED_OUTPATIENT_CLINIC_OR_DEPARTMENT_OTHER): Payer: Medicare Other | Admitting: Oncology

## 2013-08-13 VITALS — BP 147/86 | HR 77 | Temp 98.1°F | Resp 18 | Ht 64.0 in | Wt 171.9 lb

## 2013-08-13 DIAGNOSIS — I10 Essential (primary) hypertension: Secondary | ICD-10-CM

## 2013-08-13 DIAGNOSIS — D649 Anemia, unspecified: Secondary | ICD-10-CM

## 2013-08-13 DIAGNOSIS — C20 Malignant neoplasm of rectum: Secondary | ICD-10-CM

## 2013-08-13 DIAGNOSIS — R110 Nausea: Secondary | ICD-10-CM

## 2013-08-13 DIAGNOSIS — C787 Secondary malignant neoplasm of liver and intrahepatic bile duct: Secondary | ICD-10-CM

## 2013-08-13 NOTE — Progress Notes (Signed)
Hematology and Oncology Follow Up Visit  Ashley Davenport [id number] 1930/06/20 80 y.o. 08/13/2013 3:53 PM Ashley Davenport, MDClark, Don Broach, MD   Principle Diagnosis: 80 year old woman with rectal cancer diagnosed  on  09/01/2006. She hadT3 N0 moderately differentiated adenocarcinoma of the rectum with evidence of LV invasion. Now she has stage IV disease with liver mets.    Prior Therapy: She is status post neoadjuvant continuous infusion 5-FU with external radiation therapy between July 10, 2006 and Sep 01, 2006.  She declined abdominoperineal resection. She went on to receive additional 3 cycles of Xeloda, but developed hand-foot syndrome and declined further therapy.  She received FOLFIRI + Avastin. First cycle given on 09/28/12 with FOLFIRI alone. Cycle 4 was delayed due to grade 2 fatigue and grade 2-3 diarrhea. She is status post 12 cycles completed in December of 2014.   Current therapy: She started maintenance chemotherapy with oral Xeloda in 05/2013. She receives 1000 mg BID 2 weeks on and 1 week off.   Interim History: Ashley Davenport presents today for a follow up visit ID herself. She is doing well on [place] without any new. Fatigue is better today. She remains independent in ADLs. Appetite is improving and she is slowly gaining weight. She is reporting no hand foot syndrome without increased pain and no skin cracking or desquamation. No nausea. Vomiting, or diarrhea. No chest pain, shortness of breath, or dyspnea. She continued to have excellent quality of life and have regained most activities of daily living. She has not reported any genitourinary or gastrointestinal symptoms. She has not reported any neurological symptoms or cardiovascular symptoms. She has not reported any chest pain difficulty breathing cough or hemoptysis or hematemesis. She has not reported any lymphadenopathy or petechiae. The rest of review of systems was unremarkable.  Medications: I have reviewed the  patient's current medications.     Allergies:  Allergies  Allergen Reactions  . Shellfish Allergy Itching and Rash    Feels itching internally.  . Meperidine Hcl     REACTION: Hypotension  . Rosuvastatin Other (See Comments)    "Makes my muscles ache and weak in the legs"  . Tramadol Hcl     REACTION: Tongue and lips swell      Physical Exam: Blood pressure 147/86, pulse 77, temperature 98.1 F (36.7 C), temperature source Oral, resp. rate 18, height 5\' 4"  (1.626 m), weight 171 lb 14.4 oz (77.973 kg), SpO2 99.00%. ECOG: 1 General appearance: alert awake not in any distress. Head: Normocephalic, no lesions. Neck: no adenopathy, no thyromegaly. Lymph nodes: Cervical, supraclavicular, and axillary nodes normal. Heart:regular rate and rhythm, S1, S2 normal, no murmur, click, rub or gallop. Chest wall examination revealed a normal Port-A-Cath site. Lung:chest clear, no wheezing, rales, normal symmetric air entry Abdomen: soft, non-tender, without masses or organomegaly EXT:no erythema, induration, or nodules. No desquamation or cracking of the skin.   Lab Results: Lab Results  Component Value Date   WBC 5.5 08/06/2013   HGB 11.4* 08/06/2013   HCT 34.3* 08/06/2013   MCV 99.0 08/06/2013   PLT 278 08/06/2013     Chemistry      Component Value Date/Time   NA 138 08/06/2013 0844   NA 138 09/18/2012 1355   K 4.4 08/06/2013 0844   K 5.5* 09/18/2012 1355   CL 102 09/25/2012 0933   CL 103 09/18/2012 1355   CO2 19* 08/06/2013 0844   CO2 24 09/18/2012 1355   BUN 39.6* 08/06/2013 9937  BUN 38* 09/18/2012 1355   CREATININE 2.3* 08/06/2013 0844   CREATININE 2.23* 09/18/2012 1355      Component Value Date/Time   CALCIUM 9.8 08/06/2013 0844   CALCIUM 9.7 09/18/2012 1355   ALKPHOS 48 08/06/2013 0844   ALKPHOS 52 08/30/2011 1107   AST 16 08/06/2013 0844   AST 21 08/30/2011 1107   ALT <6 08/06/2013 0844   ALT 18 08/30/2011 1107   BILITOT 0.51 08/06/2013 0844   BILITOT 0.3 08/30/2011 1107     EXAM:  CT CHEST,  ABDOMEN AND PELVIS WITHOUT CONTRAST  TECHNIQUE:  Multidetector CT imaging of the chest, abdomen and pelvis was  performed following the standard protocol without IV contrast.  COMPARISON: 03/04/2013  FINDINGS:  CT CHEST FINDINGS  Mild subpleural nodularity predominantly in the upper lobes  bilaterally (for example, series 4/ image 7). Two index nodules  measuring 3 mm in the right upper lobe (series 4/ image 11) and 3 mm  in the superior segment right lower lobe (series 4/ image 20) are  unchanged.  No new/suspicious pulmonary nodules. No pleural effusion or  pneumothorax.  Visualized thyroid is unremarkable.  The heart is normal in size. No pericardial effusion. Coronary  atherosclerosis. Atherosclerotic calcifications of the aortic arch.  Small mediastinal nodes measuring up to 6 mm short axis (series 2/  image 18), within normal limits. No suspicious axillary  lymphadenopathy.  Right chest port terminates at the cavoatrial junction, incompletely  visualized.  Stable sclerosis involving the sternum/manubrium (series 603/image  64).  CT ABDOMEN AND PELVIS FINDINGS  4.9 x 5.8 cm partially calcified mass in the medial segment left  hepatic lobe (series 2/image 52), grossly unchanged in size when  measured in a similar fashion. Additional hepatic cysts in the left  hepatic lobe (series 2/ images 42 and 45).  Spleen, pancreas, and adrenal glands are within normal limits.  Gallbladder is unremarkable. No intrahepatic or extrahepatic ductal  dilatation.  Bilateral renal cysts, including a 5.6 x 5.8 cm right lower pole  renal cyst (series 2/ image 66) and a 9 mm left lower pole  hemorrhagic cyst (series 2/image 68). No renal calculi or  hydronephrosis.  No evidence of bowel obstruction. Normal appendix. Extensive colonic  diverticulosis, without associated inflammatory changes.  4.2 x 4.1 cm infrarenal abdominal aortic aneurysm with indwelling  aorto bi-iliac stent.  No  abdominopelvic ascites.  No suspicious abdominopelvic lymphadenopathy.  Status post hysterectomy. No adnexal masses.  Bladder is mildly thick-walled but underdistended.  Degenerative changes of the lumbar spine.  IMPRESSION:  5.8 cm partially calcified hepatic metastasis in the medial segment  left hepatic lobe, unchanged.  No evidence of new/progressive metastatic disease in the chest,  abdomen, or pelvis.  Additional stable ancillary findings as above.  Impression and Plan:  Ashley Davenport is a 80 year old woman with:  1. T3 N0 moderately differentiated adenocarcinoma of the rectum with evidence of LV invasion and positive surgical margins 05/30/2006. She is status post neoadjuvant continuous infusion 5-FU with external radiation therapy between July 10, 2006 and Sep 01, 2006. FOLFIRI with Avastin that completed in December of 2014. She is currently on maintenance Xeloda  Xeloda 1000 mg BID 2 weeks on and 1 week off. Tolerating well.  CT scan on 08/06/2013 was discussed today and showed no evidence of any metastatic disease outside of the liver. She continued to have a 5.8 cm left hepatic lobe lesion.  Options of treatments were discussed today, this would include continuous maintenance on Xeloda versus  possible liver directed therapy. I feel that her benefit from chemotherapy has plateaued and a consideration for liver directed therapy might be reasonable. I will refer her to interventional radiology for an evaluation. For the meantime, I will like her to continue on Xeloda.  2. Anemia: S/P Feraheme. Anemia has resolved.   3. Nausea and vomiting prophylaxis: On Zofran and compazine. This is a no longer an issue  4. HTN: She is on amlodipine and valsartan.   5. Port-A-Cath management: She will have a Port-A-Cath flush every 6-8 weeks. It was flushed with a CT scan on 08/06/2013. This will be repeated in July of 2015.  6. Followup: In  one month to assess any toxicities related to  Xeloda.     Wyatt Portela 5/12/20153:53 PM

## 2013-08-13 NOTE — Telephone Encounter (Signed)
Gave pt appt for lab and MD on June , pt will see Dr Kathlene Cote for Liver ablation on 09/10/13, pt aware , Tammi will send packet to pt

## 2013-08-20 ENCOUNTER — Encounter: Payer: Self-pay | Admitting: *Deleted

## 2013-08-20 NOTE — Progress Notes (Signed)
RECEIVED A FAX FROM [facility] CONCERNING A CONFIRMATION OF PRESCRIPTION SHIPMENT FOR CAPECITABINE ON 08/19/13.

## 2013-09-03 ENCOUNTER — Other Ambulatory Visit: Payer: Self-pay | Admitting: *Deleted

## 2013-09-03 NOTE — Telephone Encounter (Signed)
THIS REFILL REQUEST FOR CAPECITABINE WAS PLACED IN DR.SHADAD'S ACTIVE WORK FOLDER. 

## 2013-09-04 ENCOUNTER — Other Ambulatory Visit: Payer: Self-pay | Admitting: Medical Oncology

## 2013-09-04 DIAGNOSIS — C20 Malignant neoplasm of rectum: Secondary | ICD-10-CM

## 2013-09-04 MED ORDER — CAPECITABINE 500 MG PO TABS: ORAL_TABLET | ORAL | Status: DC

## 2013-09-04 NOTE — Telephone Encounter (Signed)
Xeloda prescription refill faxed to [facility] @ [phone] 

## 2013-09-09 NOTE — Telephone Encounter (Signed)
RECEIVED A FAX FROM [facility] CONCERNING A CONFIRMATION OF PRESCRIPTION SHIPMENT FOR CAPECITABINE ON 09/06/13.

## 2013-09-10 ENCOUNTER — Encounter (INDEPENDENT_AMBULATORY_CARE_PROVIDER_SITE_OTHER): Payer: Self-pay

## 2013-09-10 ENCOUNTER — Ambulatory Visit
Admission: RE | Admit: 2013-09-10 | Discharge: 2013-09-10 | Disposition: A | Discharge status: Home / Self Care | Payer: Medicare Other | Source: Ambulatory Visit | Attending: Oncology | Admitting: Oncology

## 2013-09-10 DIAGNOSIS — C20 Malignant neoplasm of rectum: Secondary | ICD-10-CM

## 2013-09-10 DIAGNOSIS — C787 Secondary malignant neoplasm of liver and intrahepatic bile duct: Secondary | ICD-10-CM

## 2013-09-12 ENCOUNTER — Telehealth: Payer: Self-pay | Admitting: Emergency Medicine

## 2013-09-12 ENCOUNTER — Ambulatory Visit (HOSPITAL_BASED_OUTPATIENT_CLINIC_OR_DEPARTMENT_OTHER): Payer: Medicare Other | Admitting: Physician Assistant

## 2013-09-12 ENCOUNTER — Other Ambulatory Visit: Payer: Self-pay | Admitting: Physician Assistant

## 2013-09-12 ENCOUNTER — Other Ambulatory Visit (HOSPITAL_BASED_OUTPATIENT_CLINIC_OR_DEPARTMENT_OTHER): Payer: Medicare Other

## 2013-09-12 ENCOUNTER — Other Ambulatory Visit: Payer: Self-pay | Admitting: Family

## 2013-09-12 ENCOUNTER — Telehealth: Payer: Self-pay | Admitting: Oncology

## 2013-09-12 VITALS — BP 137/65 | HR 67 | Temp 97.8°F | Resp 18 | Ht 64.0 in | Wt 172.8 lb

## 2013-09-12 DIAGNOSIS — C787 Secondary malignant neoplasm of liver and intrahepatic bile duct: Secondary | ICD-10-CM

## 2013-09-12 DIAGNOSIS — C20 Malignant neoplasm of rectum: Secondary | ICD-10-CM | POA: Diagnosis not present

## 2013-09-12 DIAGNOSIS — N189 Chronic kidney disease, unspecified: Secondary | ICD-10-CM

## 2013-09-12 DIAGNOSIS — N039 Chronic nephritic syndrome with unspecified morphologic changes: Secondary | ICD-10-CM

## 2013-09-12 DIAGNOSIS — D631 Anemia in chronic kidney disease: Secondary | ICD-10-CM

## 2013-09-12 DIAGNOSIS — E039 Hypothyroidism, unspecified: Secondary | ICD-10-CM

## 2013-09-12 LAB — COMPREHENSIVE METABOLIC PANEL (CC13)
ALT: 16 U/L (ref 0–55)
AST: 31 U/L (ref 5–34)
Albumin: 3.5 g/dL (ref 3.5–5.0)
Alkaline Phosphatase: 66 U/L (ref 40–150)
Anion Gap: 10 mEq/L (ref 3–11)
BUN: 28.8 mg/dL — ABNORMAL HIGH (ref 7.0–26.0)
CO2: 23 mEq/L (ref 22–29)
Calcium: 9.2 mg/dL (ref 8.4–10.4)
Chloride: 107 mEq/L (ref 98–109)
Creatinine: 2.3 mg/dL — ABNORMAL HIGH (ref 0.6–1.1)
Glucose: 108 mg/dl (ref 70–140)
POTASSIUM: 4.3 meq/L (ref 3.5–5.1)
Sodium: 140 mEq/L (ref 136–145)
Total Bilirubin: 0.48 mg/dL (ref 0.20–1.20)
Total Protein: 6.7 g/dL (ref 6.4–8.3)

## 2013-09-12 LAB — CBC WITH DIFFERENTIAL/PLATELET
BASO%: 0.2 % (ref 0.0–2.0)
BASOS ABS: 0 10*3/uL (ref 0.0–0.1)
EOS%: 3.6 % (ref 0.0–7.0)
Eosinophils Absolute: 0.2 10*3/uL (ref 0.0–0.5)
HCT: 33.4 % — ABNORMAL LOW (ref 34.8–46.6)
HEMOGLOBIN: 11 g/dL — AB (ref 11.6–15.9)
LYMPH#: 0.8 10*3/uL — AB (ref 0.9–3.3)
LYMPH%: 16.7 % (ref 14.0–49.7)
MCH: 32.9 pg (ref 25.1–34.0)
MCHC: 32.9 g/dL (ref 31.5–36.0)
MCV: 100 fL (ref 79.5–101.0)
MONO#: 0.5 10*3/uL (ref 0.1–0.9)
MONO%: 9.9 % (ref 0.0–14.0)
NEUT#: 3.5 10*3/uL (ref 1.5–6.5)
NEUT%: 69.6 % (ref 38.4–76.8)
Platelets: 233 10*3/uL (ref 145–400)
RBC: 3.34 10*6/uL — ABNORMAL LOW (ref 3.70–5.45)
RDW: 17 % — AB (ref 11.2–14.5)
WBC: 5 10*3/uL (ref 3.9–10.3)

## 2013-09-12 LAB — CEA: CEA: 9.6 ng/mL — ABNORMAL HIGH (ref 0.0–5.0)

## 2013-09-12 NOTE — Progress Notes (Signed)
Ashley Spurling, MD [address] [place] [address]  Diagnosis 80 year old woman with rectal cancer diagnosed on 09/01/2006. She had T3 N0 moderately differentiated adenocarcinoma of the rectum with evidence of LV invasion. Now she has stage IV disease with liver mets.   Prior Therapy She is status post neoadjuvant continuous infusion 5-FU with external radiation therapy between July 10, 2006 and Sep 01, 2006.  She declined abdominoperineal resection. She went on to receive an additional 3 cycle of Xeloda, but developed hand-foot syndrome and declined further therapy. She received FOLFIRI + Avastin. First cycle given on 09/28/2012 with FOLFIRI alone. Cycle 4 was delayed due to grade 2 fatigue and grade 2-3 diarrhea. She is status post 12 cycles completed in December of 2014.   Patient medical, surgical and familial history reviewed.   ROS: General ROS: negative for - chills, fatigue, fever, hot flashes, malaise, night sweats, sleep disturbance, weight gain or weight loss Psychological ROS: negative for - anxiety, behavioral disorder, concentration difficulties, depression, disorientation, hallucinations or sleep disturbances Hematological and Lymphatic ROS: negative for - bleeding problems, blood clots, bruising, fatigue, night sweats, pallor, swollen lymph nodes or weight loss Endocrine ROS: negative for - breast changes, hot flashes, malaise/lethargy, palpitations, polydipsia/polyuria, skin changes, temperature intolerance or unexpected weight changes Respiratory ROS: negative for - cough, hemoptysis, orthopnea, pleuritic pain, shortness of breath, sputum changes, stridor, tachypnea or wheezing Cardiovascular ROS: negative for - chest pain, dyspnea on exertion, edema, irregular heartbeat, loss of consciousness, murmur, palpitations, rapid heart rate or shortness of breath Gastrointestinal ROS: negative for - abdominal pain, appetite loss, blood in stools, change in bowel  habits, change in stools, constipation, gas/bloating, heartburn, hematemesis, melena, nausea/vomiting, stool incontinence or swallowing difficulty/pain Genito-Urinary ROS: negative for - dysuria, genital discharge, hematuria, incontinence, nocturia, pelvic pain or urinary frequency/urgency Musculoskeletal ROS: negative for - gait disturbance, joint pain, joint stiffness, joint swelling, muscle pain or muscular weakness Neurological ROS: negative for - behavioral changes, bowel and bladder control changes, confusion, dizziness, gait disturbance, headaches, impaired coordination/balance, speech problems, visual changes or weakness  CURRENT THERAPY: She was started on maintenance chemotherapy with oral Xeloda in 05/2013. She receives 1000 mg BID 2 weeks on and 1 week off.   INTERVAL HISTORY: Pricila C Frisina 80 y.o. female returns for her regular monthly visit for followup of rectal cancer metastatic to liver.    Past Medical History  Diagnosis Date  . AAA (abdominal aortic aneurysm) 2011  . Hyperlipidemia   . Hypertension   . Shingles     POST HERPATIC PAIN FROM SHINGLES  . Cancer     Rectal  . History of acute gouty arthritis   . Rectal cancer   . Kidney cysts   . Anemia     HX OF ANEMIA WITH CHEMO 2008  . Hypothyroidism     has Anemia associated with chronic renal failure; Rectal cancer; Abdominal aneurysm without mention of rupture; and Rectal adenocarcinoma metastatic to liver on her problem list.     is allergic to shellfish allergy; meperidine hcl; rosuvastatin; and tramadol hcl.  Ms. Keeler had no medications administered during this visit.  Past Surgical History  Procedure Laterality Date  . Abdominal hysterectomy  1961    total  . Tonsillectomy  1949  . Bunionectomy Bilateral   . Rectal tumor removed  2008    CANCEROUS  . Abdominal aorta aneurysm repair -stent  2011  . Portacath placement N/A 09/24/2012    Procedure: INSERTION PORT-A-CATH;  Surgeon:  Stark Klein, MD;  Location: [facility];  Service: General;  Laterality: N/A;  . Abdominal aortic aneurysm repair     PHYSICAL EXAMINATION  ECOG PERFORMANCE STATUS: 0 - Asymptomatic  Filed Vitals:   09/12/13 1011  BP: 137/65  Pulse: 67  Temp: 97.8 F (36.6 C)  Resp: 18    GENERAL:alert, healthy, no distress, well nourished, well developed, comfortable and smiling SKIN: skin color, texture, turgor are normal, no rashes or significant lesions HEAD: Normocephalic, No masses, lesions, tenderness or abnormalities EYES: normal, PERRLA, Conjunctiva are pink and non-injected, sclera clear EARS: External ears normal OROPHARYNX:no exudate, no erythema, lips, buccal mucosa, and tongue normal and dentition normal  NECK: supple, no adenopathy, no bruits, no JVD, thyroid normal size, non-tender, without nodularity, no stridor, non-tender, trachea midline LYMPH:  no palpable lymphadenopathy, no hepatosplenomegaly LUNGS: clear to auscultation , and palpation, clear to auscultation and percussion HEART: regular rate & rhythm, no murmurs, no gallops, S1 normal and S2 normal ABDOMEN:abdomen soft, non-tender, normal bowel sounds, no masses or organomegaly, no bladder distention identified, no rebound or guarding, no bruits and no hepatosplenomegaly BACK: Back symmetric, no curvature., No CVA tenderness, Range of motion is normal EXTREMITIES:less then 2 second capillary refill, no joint deformities, effusion, or inflammation, no edema, no skin discoloration, no clubbing, no cyanosis, pt states that she does have mild intermittent numbness and tingling in her fingers and toes that is tolerable. Also she periodically has itchy palms. Today she has no itching or redness on her palms and skin is intact. She states that occassionally she has left hip pain but that it "goes away when I take Colchicine".  NEURO: alert & oriented x 3 with fluent speech, no focal motor/sensory deficits, gait normal, reflexes normal and  symmetric  LABORATORY DATA: CBC    Component Value Date/Time   WBC 5.0 09/12/2013 0949   WBC 2.1* 01/25/2013 0805   RBC 3.34* 09/12/2013 0949   RBC 3.65* 01/25/2013 0805   HGB 11.0* 09/12/2013 0949   HGB 11.1* 01/25/2013 0805   HCT 33.4* 09/12/2013 0949   HCT 33.4* 01/25/2013 0805   PLT 233 09/12/2013 0949   PLT 284 01/25/2013 0805   MCV 100.0 09/12/2013 0949   MCV 91.5 01/25/2013 0805   MCH 32.9 09/12/2013 0949   MCH 30.4 01/25/2013 0805   MCHC 32.9 09/12/2013 0949   MCHC 33.2 01/25/2013 0805   RDW 17.0* 09/12/2013 0949   RDW 15.8* 01/25/2013 0805   LYMPHSABS 0.8* 09/12/2013 0949   LYMPHSABS 0.8 01/25/2013 0805   MONOABS 0.5 09/12/2013 0949   MONOABS 0.5 01/25/2013 0805   EOSABS 0.2 09/12/2013 0949   EOSABS 0.1 01/25/2013 0805   BASOSABS 0.0 09/12/2013 0949   BASOSABS 0.0 01/25/2013 0805   CMP     Component Value Date/Time   NA 140 09/12/2013 0949   NA 138 09/18/2012 1355   K 4.3 09/12/2013 0949   K 5.5* 09/18/2012 1355   CL 102 09/25/2012 0933   CL 103 09/18/2012 1355   CO2 23 09/12/2013 0949   CO2 24 09/18/2012 1355   GLUCOSE 108 09/12/2013 0949   GLUCOSE 137* 09/25/2012 0933   GLUCOSE 93 09/18/2012 1355   BUN 28.8* 09/12/2013 0949   BUN 38* 09/18/2012 1355   CREATININE 2.3* 09/12/2013 0949   CREATININE 2.23* 09/18/2012 1355   CALCIUM 9.2 09/12/2013 0949   CALCIUM 9.7 09/18/2012 1355   PROT 6.7 09/12/2013 0949   PROT 6.9 08/30/2011 1107   ALBUMIN 3.5 09/12/2013 0865  ALBUMIN 4.1 08/30/2011 1107   AST 31 09/12/2013 0949   AST 21 08/30/2011 1107   ALT 16 09/12/2013 0949   ALT 18 08/30/2011 1107   ALKPHOS 66 09/12/2013 0949   ALKPHOS 52 08/30/2011 1107   BILITOT 0.48 09/12/2013 0949   BILITOT 0.3 08/30/2011 1107   GFRNONAA 20* 09/18/2012 1355   GFRAA 23* 09/18/2012 1355   PENDING LABS: 09/12/2013 [facility] still pending.   RADIOGRAPHIC STUDIES: EXAM:  CT CHEST, ABDOMEN AND PELVIS WITHOUT CONTRAST  TECHNIQUE:  Multidetector CT imaging of the chest, abdomen and pelvis was  performed following  the standard protocol without IV contrast.  COMPARISON: 03/04/2013  FINDINGS:  CT CHEST FINDINGS  Mild subpleural nodularity predominantly in the upper lobes  bilaterally (for example, series 4/ image 7). Two index nodules  measuring 3 mm in the right upper lobe (series 4/ image 11) and 3 mm  in the superior segment right lower lobe (series 4/ image 20) are  unchanged.  No new/suspicious pulmonary nodules. No pleural effusion or  pneumothorax.  Visualized thyroid is unremarkable.  The heart is normal in size. No pericardial effusion. Coronary  atherosclerosis. Atherosclerotic calcifications of the aortic arch.  Small mediastinal nodes measuring up to 6 mm short axis (series 2/  image 18), within normal limits. No suspicious axillary  lymphadenopathy.  Right chest port terminates at the cavoatrial junction, incompletely  visualized.  Stable sclerosis involving the sternum/manubrium (series 603/image  64).  CT ABDOMEN AND PELVIS FINDINGS  4.9 x 5.8 cm partially calcified mass in the medial segment left  hepatic lobe (series 2/image 52), grossly unchanged in size when  measured in a similar fashion. Additional hepatic cysts in the left  hepatic lobe (series 2/ images 42 and 45).  Spleen, pancreas, and adrenal glands are within normal limits.  Gallbladder is unremarkable. No intrahepatic or extrahepatic ductal  dilatation.  Bilateral renal cysts, including a 5.6 x 5.8 cm right lower pole  renal cyst (series 2/ image 66) and a 9 mm left lower pole  hemorrhagic cyst (series 2/image 68). No renal calculi or  hydronephrosis.  No evidence of bowel obstruction. Normal appendix. Extensive colonic  diverticulosis, without associated inflammatory changes.  4.2 x 4.1 cm infrarenal abdominal aortic aneurysm with indwelling  aorto bi-iliac stent.  No abdominopelvic ascites.  No suspicious abdominopelvic lymphadenopathy.  Status post hysterectomy. No adnexal masses.  Bladder is mildly  thick-walled but underdistended.  Degenerative changes of the lumbar spine.  IMPRESSION:  5.8 cm partially calcified hepatic metastasis in the medial segment  left hepatic lobe, unchanged.  No evidence of new/progressive metastatic disease in the chest,  abdomen, or pelvis.  Additional stable ancillary findings as above.  ASSESSMENT: The patient presents today for monthly follow up to evaluate her tolerance of Xeloda. She states that she feels great on this medication. Her energy level remains excellent and she goes to the gym 2-3 times per week. She denies headache, dizziness or blurred vision. She denies having any sore throat, cough or SOB.She denies having any chest pain or palpitations. She states that her appetite is good and that she thinks she has gained weight. She denies any pain but does state that she continues to have mild intermittent numbness and tingling in her fingers and toes that is tolerable. She also states that her palms itch periodically but that it is tolerable and her skin is intact. She takes imodium regularly and states that is effectively controls her diarrhea. She denies any genitourinary problems.  THERAPY PLAN: Today's CBC and CMP were discussed with the patient. After discussing this patient with Dr. Alen Blew, it was decided that she will continue taking her Xeloda as prescribed. Dr. Alen Blew also reviewed her report from Dr. Kathlene Cote and she is waiting on her appointment from radiation oncology for treatment of metastatic liver lesion. She is scheduled for her next appointment and labs in one month with Dr. Alen Blew.  All questions were answered. The patient knows to call the clinic with any problems, questions or concerns. We can certainly see the patient much sooner if necessary.  The patient and plan discussed with Zola Button, MD and he is in agreement with the aforementioned.  I spent 20 minutes counseling the patient face to face. The total time spent in the  appointment was 30 minutes.  [facility] M 09/12/2013

## 2013-09-12 NOTE — Telephone Encounter (Signed)
LMOVM FOR Ashley Davenport, DR POWELL'S SCHEDULER TO MOVE THE PTS. APPT SOONER.  WE NEED TO GET DR POWELL'S OPINION ABOUT ADMINISTERING IV CONTRAST FOR ARTERIOGRAPHY FOR THE Y-90 PROCEDURE.  WE WOULD LIKE TO GET HER SCHEDULED FOR EARLY July. 2015.

## 2013-09-12 NOTE — Patient Instructions (Signed)
Today's CBC and CMP were discussed with the patient. After discussing this patient with Dr. Alen Blew it was decided that she will continue taking her Xeloda as prescribed. Dr. Alen Blew also reviewed her report from Dr. Kathlene Cote and she is waiting on her appointment from radiation oncology for treatment of metastatic liver lesion. She is scheduled for her next appointment and labs in one month with Dr. Alen Blew.  All questions were answered. The patient knows to call the clinic with any problems, questions or concerns. We can certainly see the patient much sooner if necessary.

## 2013-09-12 NOTE — Telephone Encounter (Signed)
Gave pt appt for Lab and MD for July 2015

## 2013-09-17 ENCOUNTER — Other Ambulatory Visit (HOSPITAL_COMMUNITY): Payer: Self-pay | Admitting: Interventional Radiology

## 2013-09-17 ENCOUNTER — Other Ambulatory Visit: Payer: Self-pay | Admitting: Oncology

## 2013-09-17 DIAGNOSIS — C20 Malignant neoplasm of rectum: Secondary | ICD-10-CM

## 2013-09-17 DIAGNOSIS — C787 Secondary malignant neoplasm of liver and intrahepatic bile duct: Secondary | ICD-10-CM

## 2013-09-24 ENCOUNTER — Other Ambulatory Visit: Payer: Self-pay | Admitting: *Deleted

## 2013-09-24 NOTE — Telephone Encounter (Signed)
THIS REFILL REQUEST FOR CAPECITABINE WAS PLACED IN DR.SHADAD'S ACTIVE WORK FOLDER. 

## 2013-09-25 ENCOUNTER — Other Ambulatory Visit: Payer: Self-pay | Admitting: *Deleted

## 2013-09-25 DIAGNOSIS — C20 Malignant neoplasm of rectum: Secondary | ICD-10-CM

## 2013-09-25 MED ORDER — CAPECITABINE 500 MG PO TABS: ORAL_TABLET | ORAL | Status: DC

## 2013-09-30 ENCOUNTER — Telehealth: Payer: Self-pay | Admitting: *Deleted

## 2013-09-30 NOTE — Telephone Encounter (Signed)
Patient calling to say her appt on 10-10-13 with interventional radiology, is at the same time as her appt with dr Alen Blew. Per dr Alen Blew, patient to call us when procedure is done, to determine if she may still come for  office visit. Patient aware.

## 2013-10-01 NOTE — Telephone Encounter (Signed)
RECEIVED A FAX FROM [facility] CONCERNING A CONCERNING A CONFIRMATION OF PRESCRIPTION SHIPMENT FOR CAPECITABINE ON 02/04/33.

## 2013-10-02 ENCOUNTER — Other Ambulatory Visit: Payer: Self-pay | Admitting: Radiology

## 2013-10-07 ENCOUNTER — Other Ambulatory Visit: Payer: Self-pay | Admitting: Interventional Radiology

## 2013-10-07 DIAGNOSIS — C787 Secondary malignant neoplasm of liver and intrahepatic bile duct: Principal | ICD-10-CM

## 2013-10-07 DIAGNOSIS — C189 Malignant neoplasm of colon, unspecified: Secondary | ICD-10-CM

## 2013-10-08 ENCOUNTER — Other Ambulatory Visit: Payer: Self-pay | Admitting: Radiology

## 2013-10-09 ENCOUNTER — Observation Stay (HOSPITAL_COMMUNITY)
Admission: AD | Admit: 2013-10-09 | Discharge: 2013-10-10 | Disposition: A | Discharge status: Home / Self Care | Payer: Medicare Other | Source: Ambulatory Visit | Attending: Interventional Radiology | Admitting: Interventional Radiology

## 2013-10-09 ENCOUNTER — Encounter (HOSPITAL_COMMUNITY): Payer: Self-pay

## 2013-10-09 DIAGNOSIS — C20 Malignant neoplasm of rectum: Secondary | ICD-10-CM | POA: Diagnosis not present

## 2013-10-09 DIAGNOSIS — Z9221 Personal history of antineoplastic chemotherapy: Secondary | ICD-10-CM | POA: Insufficient documentation

## 2013-10-09 DIAGNOSIS — Z87891 Personal history of nicotine dependence: Secondary | ICD-10-CM | POA: Diagnosis not present

## 2013-10-09 DIAGNOSIS — C787 Secondary malignant neoplasm of liver and intrahepatic bile duct: Secondary | ICD-10-CM | POA: Diagnosis not present

## 2013-10-09 DIAGNOSIS — D649 Anemia, unspecified: Secondary | ICD-10-CM | POA: Diagnosis not present

## 2013-10-09 DIAGNOSIS — M109 Gout, unspecified: Secondary | ICD-10-CM | POA: Diagnosis not present

## 2013-10-09 DIAGNOSIS — N189 Chronic kidney disease, unspecified: Secondary | ICD-10-CM | POA: Insufficient documentation

## 2013-10-09 DIAGNOSIS — I129 Hypertensive chronic kidney disease with stage 1 through stage 4 chronic kidney disease, or unspecified chronic kidney disease: Secondary | ICD-10-CM | POA: Diagnosis not present

## 2013-10-09 DIAGNOSIS — E039 Hypothyroidism, unspecified: Secondary | ICD-10-CM | POA: Insufficient documentation

## 2013-10-09 DIAGNOSIS — C19 Malignant neoplasm of rectosigmoid junction: Secondary | ICD-10-CM | POA: Diagnosis present

## 2013-10-09 DIAGNOSIS — E785 Hyperlipidemia, unspecified: Secondary | ICD-10-CM | POA: Insufficient documentation

## 2013-10-09 DIAGNOSIS — C189 Malignant neoplasm of colon, unspecified: Secondary | ICD-10-CM

## 2013-10-09 LAB — CBC WITH DIFFERENTIAL/PLATELET
BASOS PCT: 0 % (ref 0–1)
Basophils Absolute: 0 10*3/uL (ref 0.0–0.1)
Eosinophils Absolute: 0.2 10*3/uL (ref 0.0–0.7)
Eosinophils Relative: 4 % (ref 0–5)
HCT: 33.6 % — ABNORMAL LOW (ref 36.0–46.0)
HEMOGLOBIN: 11 g/dL — AB (ref 12.0–15.0)
LYMPHS PCT: 17 % (ref 12–46)
Lymphs Abs: 0.9 10*3/uL (ref 0.7–4.0)
MCH: 33.2 pg (ref 26.0–34.0)
MCHC: 32.7 g/dL (ref 30.0–36.0)
MCV: 101.5 fL — ABNORMAL HIGH (ref 78.0–100.0)
MONOS PCT: 8 % (ref 3–12)
Monocytes Absolute: 0.4 10*3/uL (ref 0.1–1.0)
NEUTROS ABS: 3.8 10*3/uL (ref 1.7–7.7)
NEUTROS PCT: 71 % (ref 43–77)
Platelets: 304 10*3/uL (ref 150–400)
RBC: 3.31 MIL/uL — AB (ref 3.87–5.11)
RDW: 15.7 % — ABNORMAL HIGH (ref 11.5–15.5)
WBC: 5.4 10*3/uL (ref 4.0–10.5)

## 2013-10-09 LAB — COMPREHENSIVE METABOLIC PANEL
ALBUMIN: 4 g/dL (ref 3.5–5.2)
ALK PHOS: 58 U/L (ref 39–117)
ALT: 10 U/L (ref 0–35)
AST: 20 U/L (ref 0–37)
Anion gap: 14 (ref 5–15)
BILIRUBIN TOTAL: 0.5 mg/dL (ref 0.3–1.2)
BUN: 41 mg/dL — AB (ref 6–23)
CHLORIDE: 101 meq/L (ref 96–112)
CO2: 22 meq/L (ref 19–32)
Calcium: 9.9 mg/dL (ref 8.4–10.5)
Creatinine, Ser: 2.37 mg/dL — ABNORMAL HIGH (ref 0.50–1.10)
GFR calc Af Amer: 21 mL/min — ABNORMAL LOW (ref >90)
GFR calc non Af Amer: 18 mL/min — ABNORMAL LOW (ref >90)
Glucose, Bld: 112 mg/dL — ABNORMAL HIGH (ref 70–99)
POTASSIUM: 5.1 meq/L (ref 3.7–5.3)
Sodium: 137 mEq/L (ref 137–147)
Total Protein: 7.8 g/dL (ref 6.0–8.3)

## 2013-10-09 LAB — PROTIME-INR
INR: 1.01 (ref 0.00–1.49)
Prothrombin Time: 13.3 seconds (ref 11.6–15.2)

## 2013-10-09 LAB — APTT: APTT: 28 s (ref 24–37)

## 2013-10-09 MED ORDER — IRBESARTAN 300 MG PO TABS
300.0000 mg | ORAL_TABLET | Freq: Every morning | ORAL | Status: DC
  Administered 2013-10-10: 300 mg via ORAL
  Filled 2013-10-09: qty 1

## 2013-10-09 MED ORDER — LIDOCAINE-PRILOCAINE 2.5-2.5 % EX CREA: 1.0000 "application " | TOPICAL_CREAM | Freq: Every day | CUTANEOUS | Status: DC | PRN

## 2013-10-09 MED ORDER — SODIUM CHLORIDE 0.9 % IV SOLN
INTRAVENOUS | Status: DC
  Administered 2013-10-09: 13:00:00 via INTRAVENOUS
  Administered 2013-10-10: 1000 mL via INTRAVENOUS

## 2013-10-09 MED ORDER — AMLODIPINE BESYLATE 5 MG PO TABS
5.0000 mg | ORAL_TABLET | Freq: Every morning | ORAL | Status: DC
  Administered 2013-10-10: 5 mg via ORAL
  Filled 2013-10-09: qty 1

## 2013-10-09 MED ORDER — PANTOPRAZOLE SODIUM 40 MG IV SOLR
40.0000 mg | Freq: Once | INTRAVENOUS | Status: AC
  Administered 2013-10-10: 40 mg via INTRAVENOUS
  Filled 2013-10-09: qty 40

## 2013-10-09 MED ORDER — FERROUS SULFATE 325 (65 FE) MG PO TABS
325.0000 mg | ORAL_TABLET | Freq: Every day | ORAL | Status: DC
  Administered 2013-10-10: 325 mg via ORAL
  Filled 2013-10-09 (×2): qty 1

## 2013-10-09 MED ORDER — ONDANSETRON HCL 4 MG/2ML IJ SOLN
4.0000 mg | Freq: Once | INTRAMUSCULAR | Status: AC
  Administered 2013-10-10: 4 mg via INTRAVENOUS
  Filled 2013-10-09: qty 2

## 2013-10-09 MED ORDER — LEVOTHYROXINE SODIUM 75 MCG PO TABS
75.0000 ug | ORAL_TABLET | Freq: Every morning | ORAL | Status: DC
  Administered 2013-10-10: 75 ug via ORAL
  Filled 2013-10-09: qty 1

## 2013-10-09 MED ORDER — CAPECITABINE 500 MG PO TABS: 1000.0000 mg | ORAL_TABLET | Freq: Two times a day (BID) | ORAL | Status: DC

## 2013-10-09 MED ORDER — PIPERACILLIN-TAZOBACTAM 3.375 G IVPB
3.3750 g | Freq: Once | INTRAVENOUS | Status: AC
  Administered 2013-10-10: 3.375 g via INTRAVENOUS
  Filled 2013-10-09: qty 50

## 2013-10-09 MED ORDER — ALLOPURINOL 150 MG HALF TABLET
150.0000 mg | ORAL_TABLET | Freq: Every morning | ORAL | Status: DC
  Administered 2013-10-10: 150 mg via ORAL
  Filled 2013-10-09: qty 1

## 2013-10-09 MED ORDER — LIDOCAINE-PRILOCAINE 2.5-2.5 % EX CREA
TOPICAL_CREAM | Freq: Once | CUTANEOUS | Status: AC
  Administered 2013-10-09: 11:00:00 via TOPICAL
  Filled 2013-10-09: qty 5

## 2013-10-09 MED ORDER — ONDANSETRON HCL 8 MG PO TABS: 8.0000 mg | ORAL_TABLET | Freq: Three times a day (TID) | ORAL | Status: DC | PRN

## 2013-10-09 MED ORDER — SODIUM CHLORIDE 0.9 % IV SOLN: INTRAVENOUS | Status: DC

## 2013-10-09 MED ORDER — DEXAMETHASONE SODIUM PHOSPHATE 10 MG/ML IJ SOLN
20.0000 mg | Freq: Once | INTRAMUSCULAR | Status: AC
  Administered 2013-10-10: 20 mg via INTRAVENOUS
  Filled 2013-10-09: qty 2

## 2013-10-09 NOTE — Progress Notes (Signed)
Oral Chemotherapy Policy - Xeloda  Labs: Scr 2.37 with est CrCl 19 (baseline SCr appears to be 1.4-1.6)  Capecitabine (Xeloda) hold criteria  ANC < 1.5  Pltc < 100K  CrCl < 30 mL/min (contraindication)  SCr > 1.5x baseline (or >2 if baseline unknown)  Bilirubin > 1.5x ULN  Active infection  Acute coronary syndrome  Diarrhea - Grade 2 or higher  Hand / foot syndrome - Grade 2 or higher  Surgery planned this admission  A/P: per hold criteria, patient does not meet criteria for continuing Xeloda inpatient at this time. Will monitor for possible restart  Adrian Saran, PharmD, BCPS Pager [phone] 10/09/2013 12:55 PM

## 2013-10-09 NOTE — H&P (Signed)
Chief Complaint: "I am here for a procedure for my liver." HPI: Ashley Davenport is an 80 y.o. female with metastatic rectal cancer with a large liver lesion. She has been seen in consult on 09/10/13 see full consult note, she has been deemed a candidate for Yttrium 90 radioembolization of her metastatic hepatic lesion. She has CKD and is being followed by Dr. Florene Glen who recommends pre-procedure hydration and she has been admitted today in preparation for her arteriography and embolization procedure on 7/9 in IR. She denies any abdominal pain, chest pain, shortness of breath or palpitations. She denies any active signs of bleeding or excessive bruising. She denies any recent fever or chills. The patient denies any history of sleep apnea or chronic oxygen use. She has previously tolerated sedation without complications during a colonoscopy.   Past Medical History:  Past Medical History  Diagnosis Date  . AAA (abdominal aortic aneurysm) 2011  . Hyperlipidemia   . Hypertension   . Shingles     POST HERPATIC PAIN FROM SHINGLES  . Cancer     Rectal  . History of acute gouty arthritis   . Rectal cancer   . Kidney cysts   . Anemia     HX OF ANEMIA WITH CHEMO 2008  . Hypothyroidism     Past Surgical History:  Past Surgical History  Procedure Laterality Date  . Abdominal hysterectomy  1961    total  . Tonsillectomy  1949  . Bunionectomy Bilateral   . Rectal tumor removed  2008    CANCEROUS  . Abdominal aorta aneurysm repair -stent  2011  . Portacath placement N/A 09/24/2012    Procedure: INSERTION PORT-A-CATH;  Surgeon: Stark Klein, MD;  Location: [facility];  Service: General;  Laterality: N/A;  . Abdominal aortic aneurysm repair      Family History:  Family History  Problem Relation Age of Onset  . Hypertension Mother   . Heart disease Mother   . Heart attack Mother   . Hypertension Father   . Heart disease Father   . Diabetes Father   . Heart attack Father   . Cancer Sister      ovarian  . Diabetes Sister   . Hyperlipidemia Sister   . Hypertension Sister   . Cancer Cousin     breast  . Cancer Cousin     breast  . Diabetes Daughter   . Hyperlipidemia Daughter   . Hypertension Daughter     Social History:  reports that she quit smoking about 25 years ago. Her smoking use included Cigarettes. She has a 20 pack-year smoking history. She has never used smokeless tobacco. She reports that she drinks alcohol. She reports that she does not use illicit drugs.  Allergies:  Allergies  Allergen Reactions  . Shellfish Allergy Itching and Rash    Feels itching internally.  . Meperidine Hcl Other (See Comments)    REACTION: Hypotension  . Tramadol Hcl Swelling    REACTION: Tongue and lips swell    Medications:   Medication List    ASK your doctor about these medications       allopurinol 300 MG tablet  Commonly known as:  ZYLOPRIM  Take 150 mg by mouth every morning.     amLODipine 5 MG tablet  Commonly known as:  NORVASC  Take 5 mg by mouth every morning.     Biotin 5000 MCG Caps  Take 5,000 mcg by mouth every morning.     capecitabine 500 MG  tablet  Commonly known as:  XELODA  Take 1,000 mg by mouth as directed. Take 2 tablets twice a day for 14 days. Take one week off, then restart.     CoQ-10 200 MG Caps  Take 200 mg by mouth at bedtime.     ferrous sulfate 325 (65 FE) MG tablet  Take 325 mg by mouth daily with breakfast.     furosemide 20 MG tablet  Commonly known as:  LASIX  Take 20 mg by mouth every morning.     irbesartan 300 MG tablet  Commonly known as:  AVAPRO  Take 300 mg by mouth every morning.     levothyroxine 75 MCG tablet  Commonly known as:  SYNTHROID, LEVOTHROID  Take 75 mcg by mouth every morning.     lidocaine-prilocaine cream  Commonly known as:  EMLA  Apply 1 application topically daily as needed (Applies to port-a-cath.).     ondansetron 8 MG tablet  Commonly known as:  ZOFRAN  Take 8 mg by mouth every 8  (eight) hours as needed for nausea or vomiting.     rosuvastatin 10 MG tablet  Commonly known as:  CRESTOR  Take 10 mg by mouth every evening.       Please HPI for pertinent positives, otherwise complete 10 system ROS negative.  Physical Exam: BP 138/54  Pulse 65  Temp(Src) 98.1 F (36.7 C) (Oral)  Resp 16  Ht 5' 4"  (1.626 m)  Wt 170 lb (77.111 kg)  BMI 29.17 kg/m2  SpO2 99% Body mass index is 29.17 kg/(m^2).  General Appearance:  Alert, cooperative, no distress  Head:  Normocephalic, without obvious abnormality, atraumatic  Neck: Supple, symmetrical, trachea midline  Lungs:   Clear to auscultation bilaterally, no w/r/r, respirations unlabored without use of accessory muscles.  Chest Wall:  No tenderness or deformity  Heart:  Regular rate and rhythm, S1, S2 normal, no murmur, rub or gallop.  Abdomen:   Soft, non-tender, non distended, (+) BS  Extremities: Extremities normal, atraumatic, no cyanosis or edema  Pulses: 2+ and symmetric  Neurologic: Normal affect, no gross deficits.   Results for orders placed during the hospital encounter of 10/09/13 (from the past 48 hour(s))  APTT     Status: None   Collection Time    10/09/13 10:20 AM      Result Value Ref Range   aPTT 28  24 - 37 seconds  CBC WITH DIFFERENTIAL     Status: Abnormal   Collection Time    10/09/13 10:20 AM      Result Value Ref Range   WBC 5.4  4.0 - 10.5 K/uL   RBC 3.31 (*) 3.87 - 5.11 MIL/uL   Hemoglobin 11.0 (*) 12.0 - 15.0 g/dL   HCT 33.6 (*) 36.0 - 46.0 %   MCV 101.5 (*) 78.0 - 100.0 fL   MCH 33.2  26.0 - 34.0 pg   MCHC 32.7  30.0 - 36.0 g/dL   RDW 15.7 (*) 11.5 - 15.5 %   Platelets 304  150 - 400 K/uL   Neutrophils Relative % 71  43 - 77 %   Neutro Abs 3.8  1.7 - 7.7 K/uL   Lymphocytes Relative 17  12 - 46 %   Lymphs Abs 0.9  0.7 - 4.0 K/uL   Monocytes Relative 8  3 - 12 %   Monocytes Absolute 0.4  0.1 - 1.0 K/uL   Eosinophils Relative 4  0 - 5 %   Eosinophils Absolute  0.2  0.0 - 0.7 K/uL    Basophils Relative 0  0 - 1 %   Basophils Absolute 0.0  0.0 - 0.1 K/uL  COMPREHENSIVE METABOLIC PANEL     Status: Abnormal   Collection Time    10/09/13 10:20 AM      Result Value Ref Range   Sodium 137  137 - 147 mEq/L   Potassium 5.1  3.7 - 5.3 mEq/L   Chloride 101  96 - 112 mEq/L   CO2 22  19 - 32 mEq/L   Glucose, Bld 112 (*) 70 - 99 mg/dL   BUN 41 (*) 6 - 23 mg/dL   Creatinine, Ser 2.37 (*) 0.50 - 1.10 mg/dL   Calcium 9.9  8.4 - 10.5 mg/dL   Total Protein 7.8  6.0 - 8.3 g/dL   Albumin 4.0  3.5 - 5.2 g/dL   AST 20  0 - 37 U/L   ALT 10  0 - 35 U/L   Alkaline Phosphatase 58  39 - 117 U/L   Total Bilirubin 0.5  0.3 - 1.2 mg/dL   GFR calc non Af Amer 18 (*) >90 mL/min   GFR calc Af Amer 21 (*) >90 mL/min   Comment: (NOTE)     The eGFR has been calculated using the CKD EPI equation.     This calculation has not been validated in all clinical situations.     eGFR's persistently <90 mL/min signify possible Chronic Kidney     Disease.   Anion gap 14  5 - 15  PROTIME-INR     Status: None   Collection Time    10/09/13 10:20 AM      Result Value Ref Range   Prothrombin Time 13.3  11.6 - 15.2 seconds   INR 1.01  0.00 - 1.49   No results found.  Assessment/Plan Metastatic rectal carcinoma  Metastatic hepatic lesion, seen in consult 09/10/13  Scheduled 7/9 for image guided hepatic arteriography and possible embolization (pre Y-90) She has been admitted today for pre-procedure hydration CKD, Cr 2.37 today HTN Anemia AAA s/p EVAR 2011 Hypothyroidism Images and labs reviewed, will recheck labs in am, NPO after midnight.  Risks and Benefits discussed with the patient. All of the patient's questions were answered, patient is agreeable to proceed. Consent signed and in chart.   Tsosie Billing D PA-C 10/09/2013, 3:36 PM

## 2013-10-10 ENCOUNTER — Telehealth: Payer: Self-pay | Admitting: Oncology

## 2013-10-10 ENCOUNTER — Ambulatory Visit: Payer: Medicare Other | Admitting: Oncology

## 2013-10-10 ENCOUNTER — Inpatient Hospital Stay (HOSPITAL_COMMUNITY): Admission: RE | Admit: 2013-10-10 | Payer: Medicare Other | Source: Ambulatory Visit

## 2013-10-10 ENCOUNTER — Observation Stay (HOSPITAL_COMMUNITY): Payer: Medicare Other

## 2013-10-10 ENCOUNTER — Ambulatory Visit (HOSPITAL_COMMUNITY)
Admission: RE | Admit: 2013-10-10 | Discharge: 2013-10-10 | Disposition: A | Discharge status: Home / Self Care | Payer: BLUE CROSS/BLUE SHIELD | Source: Ambulatory Visit | Attending: Interventional Radiology | Admitting: Interventional Radiology

## 2013-10-10 ENCOUNTER — Other Ambulatory Visit: Payer: Medicare Other

## 2013-10-10 ENCOUNTER — Observation Stay (HOSPITAL_COMMUNITY)
Admission: RE | Admit: 2013-10-10 | Discharge: 2013-10-10 | Disposition: A | Discharge status: Home / Self Care | Payer: Medicare Other | Source: Ambulatory Visit | Attending: Interventional Radiology | Admitting: Interventional Radiology

## 2013-10-10 ENCOUNTER — Ambulatory Visit (HOSPITAL_COMMUNITY)
Admission: RE | Admit: 2013-10-10 | Payer: Medicare Other | Source: Ambulatory Visit | Admitting: Interventional Radiology

## 2013-10-10 DIAGNOSIS — C787 Secondary malignant neoplasm of liver and intrahepatic bile duct: Secondary | ICD-10-CM

## 2013-10-10 DIAGNOSIS — C20 Malignant neoplasm of rectum: Secondary | ICD-10-CM

## 2013-10-10 LAB — CBC WITH DIFFERENTIAL/PLATELET
Basophils Absolute: 0 10*3/uL (ref 0.0–0.1)
Basophils Relative: 0 % (ref 0–1)
EOS ABS: 0.2 10*3/uL (ref 0.0–0.7)
Eosinophils Relative: 4 % (ref 0–5)
HCT: 31 % — ABNORMAL LOW (ref 36.0–46.0)
Hemoglobin: 10.2 g/dL — ABNORMAL LOW (ref 12.0–15.0)
LYMPHS ABS: 1.4 10*3/uL (ref 0.7–4.0)
Lymphocytes Relative: 21 % (ref 12–46)
MCH: 33.2 pg (ref 26.0–34.0)
MCHC: 32.9 g/dL (ref 30.0–36.0)
MCV: 101 fL — AB (ref 78.0–100.0)
MONOS PCT: 7 % (ref 3–12)
Monocytes Absolute: 0.5 10*3/uL (ref 0.1–1.0)
Neutro Abs: 4.6 10*3/uL (ref 1.7–7.7)
Neutrophils Relative %: 68 % (ref 43–77)
Platelets: 309 10*3/uL (ref 150–400)
RBC: 3.07 MIL/uL — AB (ref 3.87–5.11)
RDW: 15.6 % — ABNORMAL HIGH (ref 11.5–15.5)
WBC: 6.7 10*3/uL (ref 4.0–10.5)

## 2013-10-10 LAB — COMPREHENSIVE METABOLIC PANEL
ALT: 9 U/L (ref 0–35)
ANION GAP: 12 (ref 5–15)
AST: 17 U/L (ref 0–37)
Albumin: 3.4 g/dL — ABNORMAL LOW (ref 3.5–5.2)
Alkaline Phosphatase: 50 U/L (ref 39–117)
BUN: 38 mg/dL — ABNORMAL HIGH (ref 6–23)
CALCIUM: 9.5 mg/dL (ref 8.4–10.5)
CO2: 22 mEq/L (ref 19–32)
Chloride: 103 mEq/L (ref 96–112)
Creatinine, Ser: 2.1 mg/dL — ABNORMAL HIGH (ref 0.50–1.10)
GFR calc Af Amer: 24 mL/min — ABNORMAL LOW (ref >90)
GFR calc non Af Amer: 21 mL/min — ABNORMAL LOW (ref >90)
Glucose, Bld: 101 mg/dL — ABNORMAL HIGH (ref 70–99)
Potassium: 5.1 mEq/L (ref 3.7–5.3)
Sodium: 137 mEq/L (ref 137–147)
TOTAL PROTEIN: 6.6 g/dL (ref 6.0–8.3)
Total Bilirubin: 0.4 mg/dL (ref 0.3–1.2)

## 2013-10-10 MED ORDER — TECHNETIUM TO 99M ALBUMIN AGGREGATED
4.2000 | Freq: Once | INTRAVENOUS | Status: AC | PRN
  Administered 2013-10-10: 4 via INTRAVENOUS

## 2013-10-10 MED ORDER — FENTANYL CITRATE 0.05 MG/ML IJ SOLN
INTRAMUSCULAR | Status: AC
  Filled 2013-10-10: qty 6

## 2013-10-10 MED ORDER — FENTANYL CITRATE 0.05 MG/ML IJ SOLN
INTRAMUSCULAR | Status: AC | PRN
  Administered 2013-10-10: 25 ug via INTRAVENOUS
  Administered 2013-10-10: 50 ug via INTRAVENOUS
  Administered 2013-10-10: 25 ug via INTRAVENOUS

## 2013-10-10 MED ORDER — MIDAZOLAM HCL 2 MG/2ML IJ SOLN
INTRAMUSCULAR | Status: AC
  Filled 2013-10-10: qty 6

## 2013-10-10 MED ORDER — IODIXANOL 320 MG/ML IV SOLN: 100.0000 mL | Freq: Once | INTRAVENOUS | Status: DC | PRN

## 2013-10-10 MED ORDER — MIDAZOLAM HCL 2 MG/2ML IJ SOLN
INTRAMUSCULAR | Status: AC | PRN
  Administered 2013-10-10 (×2): 0.5 mg via INTRAVENOUS
  Administered 2013-10-10: 1 mg via INTRAVENOUS
  Administered 2013-10-10 (×4): 0.5 mg via INTRAVENOUS

## 2013-10-10 MED ORDER — SODIUM CHLORIDE 0.9 % IV SOLN: INTRAVENOUS | Status: DC

## 2013-10-10 MED ORDER — HEPARIN SOD (PORK) LOCK FLUSH 100 UNIT/ML IV SOLN
500.0000 [IU] | INTRAVENOUS | Status: DC | PRN
  Filled 2013-10-10: qty 5

## 2013-10-10 MED ORDER — LIDOCAINE HCL 1 % IJ SOLN
INTRAMUSCULAR | Status: AC
  Filled 2013-10-10: qty 20

## 2013-10-10 NOTE — Telephone Encounter (Signed)
s.w. pt and and r/s appt due to bing in hospital...done...printed new sched new d.t

## 2013-10-10 NOTE — Discharge Summary (Signed)
Physician Discharge Summary  Patient ID: Ashley Davenport MRN: [id number] DOB/AGE: 1930-11-20 80 y.o.  Admit date: 10/09/2013 Discharge date: 10/10/2013  Admission Diagnoses: Metastatic rectal carcinoma to the liver, chronic kidney disease  Discharge Diagnoses: Metastatic rectal carcinoma to the liver, status post mapping hepatic arteriography with embolization of the gastroduodenal artery on 10/10/2013 in preparation for planned hepatic Y- 90  radioembolization treatment on 10/22/2013; chronic kidney disease. Active Problems:   Rectal cancer metastasized to liver  Past Medical History  Diagnosis Date  . AAA (abdominal aortic aneurysm) 2011  . Hyperlipidemia   . Hypertension   . Shingles     POST HERPATIC PAIN FROM SHINGLES  . Cancer     Rectal  . History of acute gouty arthritis   . Rectal cancer   . Kidney cysts   . Anemia     HX OF ANEMIA WITH CHEMO 2008  . Hypothyroidism      Discharged Condition: good  Hospital Course: Ashley Davenport is a 80 year old black female who was recently referred to the interventional radiology service by Dr. Zola Button for consultation regarding possible transcatheter vs percutaneous treatment options for metastatic rectal carcinoma to the liver (prior chemoradiation). She was evaluated by Dr. Aletta Edouard and deemed to be a suitable candidate for Y- 90 radioembolization treatment to an isolated left lobe liver metastasis. There is no current evidence of additional metastatic disease . The lesion is partially calcified, predominantly subcapsular and located within the medial left lobe with a component crossing just into the lateral segment. It currently measures approximately 4.9 x 5.8 cm. Patient also has a known history of chronic kidney disease and is followed by Dr. Erling Cruz. Upon his recommendation the patient was admitted to the hospital on 10/09/2013 for IV hydration prior to pre Y-90 mapping hepatic arteriography and embolization.  Creatinine level on day of admission  was 2.37 . IV normal saline at 75 cc per hour was initiated. Followup creatinine on 10/10/13 was 2.1 . On 10/10/2013 hepatic arteriography demonstrated left hepatic artery supplied to the metastatic disease in the liver . The gastroduodenal artery was successfully embolized with coils. MAA injection was performed in the left hepatic artery to assess distribution by nuclear medicine scintigraphy and allow lung shunt calculation. The patient tolerated the procedure well. She was observed in the hospital for an additional 6 hours. She was able to ambulate and void without difficulty. She tolerated her diet well. Patient was seen by Dr. Kathlene Cote and deemed stable for discharge. The patient will return to [facility] on 10/21/2013 for IV hydration prior to planned Y-90 radioembolization of the liver on 10/22/2013. She was told to contact the IR service with any additional questions or concerns.  Consults: none  Significant Diagnostic Studies:  Results for orders placed during the hospital encounter of 10/09/13  APTT      Result Value Ref Range   aPTT 28  24 - 37 seconds  CBC WITH DIFFERENTIAL      Result Value Ref Range   WBC 5.4  4.0 - 10.5 K/uL   RBC 3.31 (*) 3.87 - 5.11 MIL/uL   Hemoglobin 11.0 (*) 12.0 - 15.0 g/dL   HCT 33.6 (*) 36.0 - 46.0 %   MCV 101.5 (*) 78.0 - 100.0 fL   MCH 33.2  26.0 - 34.0 pg   MCHC 32.7  30.0 - 36.0 g/dL   RDW 15.7 (*) 11.5 - 15.5 %   Platelets 304  150 - 400 K/uL  Neutrophils Relative % 71  43 - 77 %   Neutro Abs 3.8  1.7 - 7.7 K/uL   Lymphocytes Relative 17  12 - 46 %   Lymphs Abs 0.9  0.7 - 4.0 K/uL   Monocytes Relative 8  3 - 12 %   Monocytes Absolute 0.4  0.1 - 1.0 K/uL   Eosinophils Relative 4  0 - 5 %   Eosinophils Absolute 0.2  0.0 - 0.7 K/uL   Basophils Relative 0  0 - 1 %   Basophils Absolute 0.0  0.0 - 0.1 K/uL  COMPREHENSIVE METABOLIC PANEL      Result Value Ref Range   Sodium 137  137 - 147 mEq/L    Potassium 5.1  3.7 - 5.3 mEq/L   Chloride 101  96 - 112 mEq/L   CO2 22  19 - 32 mEq/L   Glucose, Bld 112 (*) 70 - 99 mg/dL   BUN 41 (*) 6 - 23 mg/dL   Creatinine, Ser 2.37 (*) 0.50 - 1.10 mg/dL   Calcium 9.9  8.4 - 10.5 mg/dL   Total Protein 7.8  6.0 - 8.3 g/dL   Albumin 4.0  3.5 - 5.2 g/dL   AST 20  0 - 37 U/L   ALT 10  0 - 35 U/L   Alkaline Phosphatase 58  39 - 117 U/L   Total Bilirubin 0.5  0.3 - 1.2 mg/dL   GFR calc non Af Amer 18 (*) >90 mL/min   GFR calc Af Amer 21 (*) >90 mL/min   Anion gap 14  5 - 15  PROTIME-INR      Result Value Ref Range   Prothrombin Time 13.3  11.6 - 15.2 seconds   INR 1.01  0.00 - 1.49  CBC WITH DIFFERENTIAL      Result Value Ref Range   WBC 6.7  4.0 - 10.5 K/uL   RBC 3.07 (*) 3.87 - 5.11 MIL/uL   Hemoglobin 10.2 (*) 12.0 - 15.0 g/dL   HCT 31.0 (*) 36.0 - 46.0 %   MCV 101.0 (*) 78.0 - 100.0 fL   MCH 33.2  26.0 - 34.0 pg   MCHC 32.9  30.0 - 36.0 g/dL   RDW 15.6 (*) 11.5 - 15.5 %   Platelets 309  150 - 400 K/uL   Neutrophils Relative % 68  43 - 77 %   Neutro Abs 4.6  1.7 - 7.7 K/uL   Lymphocytes Relative 21  12 - 46 %   Lymphs Abs 1.4  0.7 - 4.0 K/uL   Monocytes Relative 7  3 - 12 %   Monocytes Absolute 0.5  0.1 - 1.0 K/uL   Eosinophils Relative 4  0 - 5 %   Eosinophils Absolute 0.2  0.0 - 0.7 K/uL   Basophils Relative 0  0 - 1 %   Basophils Absolute 0.0  0.0 - 0.1 K/uL  COMPREHENSIVE METABOLIC PANEL      Result Value Ref Range   Sodium 137  137 - 147 mEq/L   Potassium 5.1  3.7 - 5.3 mEq/L   Chloride 103  96 - 112 mEq/L   CO2 22  19 - 32 mEq/L   Glucose, Bld 101 (*) 70 - 99 mg/dL   BUN 38 (*) 6 - 23 mg/dL   Creatinine, Ser 2.10 (*) 0.50 - 1.10 mg/dL   Calcium 9.5  8.4 - 10.5 mg/dL   Total Protein 6.6  6.0 - 8.3 g/dL   Albumin 3.4 (*)  3.5 - 5.2 g/dL   AST 17  0 - 37 U/L   ALT 9  0 - 35 U/L   Alkaline Phosphatase 50  39 - 117 U/L   Total Bilirubin 0.4  0.3 - 1.2 mg/dL   GFR calc non Af Amer 21 (*) >90 mL/min   GFR calc Af Amer 24  (*) >90 mL/min   Anion gap 12  5 - 15     Treatments: Nm Liver Img Spect  10/10/2013   CLINICAL DATA:  Colorectal carcinoma of with hepatic metastasis. Left hepatic lobe injection.  EXAM: NUCLEAR MEDICINE LIVER SCAN; post processing 3D fusion.  TECHNIQUE: Abdominal images were obtained in multiple projections after intrahepatic arterial injection of radiopharmaceutical. SPECT imaging was performed. Lung shunt calculation was performed. Processing 3D fusion was performed.  RADIOPHARMACEUTICALS:  4MILLI CURIE MAA TECHNETIUM TO 75M ALBUMIN AGGREGATED  COMPARISON:  CT 08/06/2013  FINDINGS: The injected microaggregated albumin localizes primarily within the left hepatic lobe. No evidence of activity within the stomach, duodenum, or bowel.  Calculated shunt fraction to the lungs equals 6.4%.  IMPRESSION: 1. No significant extrahepatic radiotracer activity following intrahepatic arterial injection of MAA. 2. Lung shunt fraction equals 6.4%   Electronically Signed   By: Suzy Bouchard M.D.   On: 10/10/2013 14:05   Ir Angiogram Visceral Selective  10/10/2013   CLINICAL DATA:  Metastatic colon carcinoma to the liver. The patient has been evaluated is a candidate for Yttrium-90 radioembolization of the liver to treat residual metastatic disease. Mapping arteriography with possible gastric/ duodenum supply embolization and shunt calculation is performed prior to radioembolization. Due to chronic kidney disease and chronic renal insufficiency, the patient was admitted approximately 24 hr prior to the procedure and was hydrated overnight with saline.  EXAM: 1. ULTRASOUND GUIDANCE FOR VASCULAR ACCESS OF THE RIGHT COMMON FEMORAL ARTERY 2. VISCERAL ARTERIOGRAPHY OF THE CELIAC AXIS 3. ADDITIONAL SELECTIVE ARTERIOGRAPHY OF THE COMMON HEPATIC ARTERY, GASTRODUODENAL ARTERY, PROPER HEPATIC ARTERY AND LEFT HEPATIC ARTERY 4. TRANSCATHETER COIL EMBOLIZATION OF THE GASTRODUODENAL ARTERY 5. MAA INJECTION INTO THE LEFT HEPATIC ARTERY  FOR NUCLEAR MEDICINE IMAGING AND SHUNT CALCULATION.  MEDICATIONS: Prior to the procedure the patient received 3.375 g of IV Zosyn.  4 mg IV Versed sec, 100 mcg IV fentanyl.  Total Moderate Sedation Time:  105 minutes.  CONTRAST:  90 mL Visipaque 320  FLUOROSCOPY TIME:  30 min and 24 seconds.  PROCEDURE: Ultrasound was used to confirm patency of the right common femoral artery. Under direct ultrasound guidance, access of the common femoral artery was performed with a micropuncture set. A 5 French sheath was then placed over a guidewire.  A 5 [place] cobra catheter was advanced into the abdominal aorta. This is used to selectively catheterize the celiac axis. Attempt was made to advance the catheter over a guidewire.  A 5 [place] Sos catheter was used to selectively catheterize the celiac axis. Selective arteriography was performed through the catheter.  A 5 French Chung catheter was used to engage the celiac axis and common hepatic artery. Selective arteriography was performed at the level of the common hepatic artery.  A Renegade STC microcatheter was advanced through the [facility] catheter and further into the common hepatic artery. Selective arteriography was performed through the micro catheter. The catheter was then advanced into the gastroduodenal artery and selective arteriography performed. Embolization of the gastroduodenal artery was then performed through the micro catheter with deployment of 0.018 inch Interlock coils. A total of 8 embolization coils were deployed  ranging in diameter from 2 mm to 6 mm and of varying lengths. Additional angiography was performed through the micro catheter after embolization.  A high flow micro catheter was then utilized to catheterize the proper hepatic artery and additional selective arteriography performed. The left hepatic artery was then catheterized with the micro catheter and selective arteriography performed. A dose of 4 mCi of technetium 70m MAA was then injected into  the left hepatic artery. Catheter and sheath were then removed and hemostasis obtained with manual compression and use of a V pad. The patient was then transported to nuclear medicine for additional nuclear medicine imaging of the chest and abdomen with lung shunt calculation.  COMPLICATIONS: None  FINDINGS: Due to tortuous course, the celiac axis was difficult to catheterize and advance a catheter into. Ultimately, the trunk was able to be securely accessed with a Chung catheter. Common hepatic arteriography demonstrates a normally positioned gastroduodenal artery. Shortly beyond the gastroduodenal artery, a left hepatic artery is identified supplying lateral and medial segments of the left lobe.  Additional left hepatic arteriography demonstrates an enhancing lesion in the left lobe corresponding to the known residual metastatic lesion. There is a small right gastric artery emanating off of the proper hepatic artery near the branch point of the right and left hepatic arteries. This vessel was too small and tortuous to catheterize to allow embolization.  The gastroduodenal artery was completely occluded with coils. MAA injection was performed in the left hepatic artery at its first bifurcation.  IMPRESSION: Hepatic arteriography demonstrates left hepatic artery supply to metastatic disease in the liver. The gastroduodenal artery was successfully embolized with coils. MAA injection was performed in the left hepatic artery to assess distribution by nuclear medicine scintigraphy and allow lung shunt calculation. The patient will continue to receive IV hydration following the procedure for approximately 6 hr.   Electronically Signed   By: Aletta Edouard M.D.   On: 10/10/2013 15:51   Ir Angiogram Pelvis Selective Or Supraselective  10/10/2013   CLINICAL DATA:  Metastatic colon carcinoma to the liver. The patient has been evaluated is a candidate for Yttrium-90 radioembolization of the liver to treat residual  metastatic disease. Mapping arteriography with possible gastric/ duodenum supply embolization and shunt calculation is performed prior to radioembolization. Due to chronic kidney disease and chronic renal insufficiency, the patient was admitted approximately 24 hr prior to the procedure and was hydrated overnight with saline.  EXAM: 1. ULTRASOUND GUIDANCE FOR VASCULAR ACCESS OF THE RIGHT COMMON FEMORAL ARTERY 2. VISCERAL ARTERIOGRAPHY OF THE CELIAC AXIS 3. ADDITIONAL SELECTIVE ARTERIOGRAPHY OF THE COMMON HEPATIC ARTERY, GASTRODUODENAL ARTERY, PROPER HEPATIC ARTERY AND LEFT HEPATIC ARTERY 4. TRANSCATHETER COIL EMBOLIZATION OF THE GASTRODUODENAL ARTERY 5. MAA INJECTION INTO THE LEFT HEPATIC ARTERY FOR NUCLEAR MEDICINE IMAGING AND SHUNT CALCULATION.  MEDICATIONS: Prior to the procedure the patient received 3.375 g of IV Zosyn.  4 mg IV Versed sec, 100 mcg IV fentanyl.  Total Moderate Sedation Time:  105 minutes.  CONTRAST:  90 mL Visipaque 320  FLUOROSCOPY TIME:  30 min and 24 seconds.  PROCEDURE: Ultrasound was used to confirm patency of the right common femoral artery. Under direct ultrasound guidance, access of the common femoral artery was performed with a micropuncture set. A 5 French sheath was then placed over a guidewire.  A 5 [place] cobra catheter was advanced into the abdominal aorta. This is used to selectively catheterize the celiac axis. Attempt was made to advance the catheter over a guidewire.  A 5  [place] Sos catheter was used to selectively catheterize the celiac axis. Selective arteriography was performed through the catheter.  A 5 French Chung catheter was used to engage the celiac axis and common hepatic artery. Selective arteriography was performed at the level of the common hepatic artery.  A Renegade STC microcatheter was advanced through the [facility] catheter and further into the common hepatic artery. Selective arteriography was performed through the micro catheter. The catheter was then advanced  into the gastroduodenal artery and selective arteriography performed. Embolization of the gastroduodenal artery was then performed through the micro catheter with deployment of 0.018 inch Interlock coils. A total of 8 embolization coils were deployed ranging in diameter from 2 mm to 6 mm and of varying lengths. Additional angiography was performed through the micro catheter after embolization.  A high flow micro catheter was then utilized to catheterize the proper hepatic artery and additional selective arteriography performed. The left hepatic artery was then catheterized with the micro catheter and selective arteriography performed. A dose of 4 mCi of technetium 71m MAA was then injected into the left hepatic artery. Catheter and sheath were then removed and hemostasis obtained with manual compression and use of a V pad. The patient was then transported to nuclear medicine for additional nuclear medicine imaging of the chest and abdomen with lung shunt calculation.  COMPLICATIONS: None  FINDINGS: Due to tortuous course, the celiac axis was difficult to catheterize and advance a catheter into. Ultimately, the trunk was able to be securely accessed with a Chung catheter. Common hepatic arteriography demonstrates a normally positioned gastroduodenal artery. Shortly beyond the gastroduodenal artery, a left hepatic artery is identified supplying lateral and medial segments of the left lobe.  Additional left hepatic arteriography demonstrates an enhancing lesion in the left lobe corresponding to the known residual metastatic lesion. There is a small right gastric artery emanating off of the proper hepatic artery near the branch point of the right and left hepatic arteries. This vessel was too small and tortuous to catheterize to allow embolization.  The gastroduodenal artery was completely occluded with coils. MAA injection was performed in the left hepatic artery at its first bifurcation.  IMPRESSION: Hepatic  arteriography demonstrates left hepatic artery supply to metastatic disease in the liver. The gastroduodenal artery was successfully embolized with coils. MAA injection was performed in the left hepatic artery to assess distribution by nuclear medicine scintigraphy and allow lung shunt calculation. The patient will continue to receive IV hydration following the procedure for approximately 6 hr.   Electronically Signed   By: Aletta Edouard M.D.   On: 10/10/2013 15:51   Ir Angiogram Pelvis Selective Or Supraselective  10/10/2013   CLINICAL DATA:  Metastatic colon carcinoma to the liver. The patient has been evaluated is a candidate for Yttrium-90 radioembolization of the liver to treat residual metastatic disease. Mapping arteriography with possible gastric/ duodenum supply embolization and shunt calculation is performed prior to radioembolization. Due to chronic kidney disease and chronic renal insufficiency, the patient was admitted approximately 24 hr prior to the procedure and was hydrated overnight with saline.  EXAM: 1. ULTRASOUND GUIDANCE FOR VASCULAR ACCESS OF THE RIGHT COMMON FEMORAL ARTERY 2. VISCERAL ARTERIOGRAPHY OF THE CELIAC AXIS 3. ADDITIONAL SELECTIVE ARTERIOGRAPHY OF THE COMMON HEPATIC ARTERY, GASTRODUODENAL ARTERY, PROPER HEPATIC ARTERY AND LEFT HEPATIC ARTERY 4. TRANSCATHETER COIL EMBOLIZATION OF THE GASTRODUODENAL ARTERY 5. MAA INJECTION INTO THE LEFT HEPATIC ARTERY FOR NUCLEAR MEDICINE IMAGING AND SHUNT CALCULATION.  MEDICATIONS: Prior to the procedure the patient received  3.375 g of IV Zosyn.  4 mg IV Versed sec, 100 mcg IV fentanyl.  Total Moderate Sedation Time:  105 minutes.  CONTRAST:  90 mL Visipaque 320  FLUOROSCOPY TIME:  30 min and 24 seconds.  PROCEDURE: Ultrasound was used to confirm patency of the right common femoral artery. Under direct ultrasound guidance, access of the common femoral artery was performed with a micropuncture set. A 5 French sheath was then placed over a  guidewire.  A 5 [place] cobra catheter was advanced into the abdominal aorta. This is used to selectively catheterize the celiac axis. Attempt was made to advance the catheter over a guidewire.  A 5 [place] Sos catheter was used to selectively catheterize the celiac axis. Selective arteriography was performed through the catheter.  A 5 French Chung catheter was used to engage the celiac axis and common hepatic artery. Selective arteriography was performed at the level of the common hepatic artery.  A Renegade STC microcatheter was advanced through the [facility] catheter and further into the common hepatic artery. Selective arteriography was performed through the micro catheter. The catheter was then advanced into the gastroduodenal artery and selective arteriography performed. Embolization of the gastroduodenal artery was then performed through the micro catheter with deployment of 0.018 inch Interlock coils. A total of 8 embolization coils were deployed ranging in diameter from 2 mm to 6 mm and of varying lengths. Additional angiography was performed through the micro catheter after embolization.  A high flow micro catheter was then utilized to catheterize the proper hepatic artery and additional selective arteriography performed. The left hepatic artery was then catheterized with the micro catheter and selective arteriography performed. A dose of 4 mCi of technetium 2m MAA was then injected into the left hepatic artery. Catheter and sheath were then removed and hemostasis obtained with manual compression and use of a V pad. The patient was then transported to nuclear medicine for additional nuclear medicine imaging of the chest and abdomen with lung shunt calculation.  COMPLICATIONS: None  FINDINGS: Due to tortuous course, the celiac axis was difficult to catheterize and advance a catheter into. Ultimately, the trunk was able to be securely accessed with a Chung catheter. Common hepatic arteriography demonstrates a  normally positioned gastroduodenal artery. Shortly beyond the gastroduodenal artery, a left hepatic artery is identified supplying lateral and medial segments of the left lobe.  Additional left hepatic arteriography demonstrates an enhancing lesion in the left lobe corresponding to the known residual metastatic lesion. There is a small right gastric artery emanating off of the proper hepatic artery near the branch point of the right and left hepatic arteries. This vessel was too small and tortuous to catheterize to allow embolization.  The gastroduodenal artery was completely occluded with coils. MAA injection was performed in the left hepatic artery at its first bifurcation.  IMPRESSION: Hepatic arteriography demonstrates left hepatic artery supply to metastatic disease in the liver. The gastroduodenal artery was successfully embolized with coils. MAA injection was performed in the left hepatic artery to assess distribution by nuclear medicine scintigraphy and allow lung shunt calculation. The patient will continue to receive IV hydration following the procedure for approximately 6 hr.   Electronically Signed   By: Aletta Edouard M.D.   On: 10/10/2013 15:51   Ir Angiogram Pelvis Selective Or Supraselective  10/10/2013   CLINICAL DATA:  Metastatic colon carcinoma to the liver. The patient has been evaluated is a candidate for Yttrium-90 radioembolization of the liver to treat residual metastatic disease.  Mapping arteriography with possible gastric/ duodenum supply embolization and shunt calculation is performed prior to radioembolization. Due to chronic kidney disease and chronic renal insufficiency, the patient was admitted approximately 24 hr prior to the procedure and was hydrated overnight with saline.  EXAM: 1. ULTRASOUND GUIDANCE FOR VASCULAR ACCESS OF THE RIGHT COMMON FEMORAL ARTERY 2. VISCERAL ARTERIOGRAPHY OF THE CELIAC AXIS 3. ADDITIONAL SELECTIVE ARTERIOGRAPHY OF THE COMMON HEPATIC ARTERY,  GASTRODUODENAL ARTERY, PROPER HEPATIC ARTERY AND LEFT HEPATIC ARTERY 4. TRANSCATHETER COIL EMBOLIZATION OF THE GASTRODUODENAL ARTERY 5. MAA INJECTION INTO THE LEFT HEPATIC ARTERY FOR NUCLEAR MEDICINE IMAGING AND SHUNT CALCULATION.  MEDICATIONS: Prior to the procedure the patient received 3.375 g of IV Zosyn.  4 mg IV Versed sec, 100 mcg IV fentanyl.  Total Moderate Sedation Time:  105 minutes.  CONTRAST:  90 mL Visipaque 320  FLUOROSCOPY TIME:  30 min and 24 seconds.  PROCEDURE: Ultrasound was used to confirm patency of the right common femoral artery. Under direct ultrasound guidance, access of the common femoral artery was performed with a micropuncture set. A 5 French sheath was then placed over a guidewire.  A 5 [place] cobra catheter was advanced into the abdominal aorta. This is used to selectively catheterize the celiac axis. Attempt was made to advance the catheter over a guidewire.  A 5 [place] Sos catheter was used to selectively catheterize the celiac axis. Selective arteriography was performed through the catheter.  A 5 French Chung catheter was used to engage the celiac axis and common hepatic artery. Selective arteriography was performed at the level of the common hepatic artery.  A Renegade STC microcatheter was advanced through the [facility] catheter and further into the common hepatic artery. Selective arteriography was performed through the micro catheter. The catheter was then advanced into the gastroduodenal artery and selective arteriography performed. Embolization of the gastroduodenal artery was then performed through the micro catheter with deployment of 0.018 inch Interlock coils. A total of 8 embolization coils were deployed ranging in diameter from 2 mm to 6 mm and of varying lengths. Additional angiography was performed through the micro catheter after embolization.  A high flow micro catheter was then utilized to catheterize the proper hepatic artery and additional selective arteriography  performed. The left hepatic artery was then catheterized with the micro catheter and selective arteriography performed. A dose of 4 mCi of technetium 80m MAA was then injected into the left hepatic artery. Catheter and sheath were then removed and hemostasis obtained with manual compression and use of a V pad. The patient was then transported to nuclear medicine for additional nuclear medicine imaging of the chest and abdomen with lung shunt calculation.  COMPLICATIONS: None  FINDINGS: Due to tortuous course, the celiac axis was difficult to catheterize and advance a catheter into. Ultimately, the trunk was able to be securely accessed with a Chung catheter. Common hepatic arteriography demonstrates a normally positioned gastroduodenal artery. Shortly beyond the gastroduodenal artery, a left hepatic artery is identified supplying lateral and medial segments of the left lobe.  Additional left hepatic arteriography demonstrates an enhancing lesion in the left lobe corresponding to the known residual metastatic lesion. There is a small right gastric artery emanating off of the proper hepatic artery near the branch point of the right and left hepatic arteries. This vessel was too small and tortuous to catheterize to allow embolization.  The gastroduodenal artery was completely occluded with coils. MAA injection was performed in the left hepatic artery at its first bifurcation.  IMPRESSION:  Hepatic arteriography demonstrates left hepatic artery supply to metastatic disease in the liver. The gastroduodenal artery was successfully embolized with coils. MAA injection was performed in the left hepatic artery to assess distribution by nuclear medicine scintigraphy and allow lung shunt calculation. The patient will continue to receive IV hydration following the procedure for approximately 6 hr.   Electronically Signed   By: Aletta Edouard M.D.   On: 10/10/2013 15:51   Ir Angiogram Selective Each Additional  Vessel  10/10/2013   CLINICAL DATA:  Metastatic colon carcinoma to the liver. The patient has been evaluated is a candidate for Yttrium-90 radioembolization of the liver to treat residual metastatic disease. Mapping arteriography with possible gastric/ duodenum supply embolization and shunt calculation is performed prior to radioembolization. Due to chronic kidney disease and chronic renal insufficiency, the patient was admitted approximately 24 hr prior to the procedure and was hydrated overnight with saline.  EXAM: 1. ULTRASOUND GUIDANCE FOR VASCULAR ACCESS OF THE RIGHT COMMON FEMORAL ARTERY 2. VISCERAL ARTERIOGRAPHY OF THE CELIAC AXIS 3. ADDITIONAL SELECTIVE ARTERIOGRAPHY OF THE COMMON HEPATIC ARTERY, GASTRODUODENAL ARTERY, PROPER HEPATIC ARTERY AND LEFT HEPATIC ARTERY 4. TRANSCATHETER COIL EMBOLIZATION OF THE GASTRODUODENAL ARTERY 5. MAA INJECTION INTO THE LEFT HEPATIC ARTERY FOR NUCLEAR MEDICINE IMAGING AND SHUNT CALCULATION.  MEDICATIONS: Prior to the procedure the patient received 3.375 g of IV Zosyn.  4 mg IV Versed sec, 100 mcg IV fentanyl.  Total Moderate Sedation Time:  105 minutes.  CONTRAST:  90 mL Visipaque 320  FLUOROSCOPY TIME:  30 min and 24 seconds.  PROCEDURE: Ultrasound was used to confirm patency of the right common femoral artery. Under direct ultrasound guidance, access of the common femoral artery was performed with a micropuncture set. A 5 French sheath was then placed over a guidewire.  A 5 [place] cobra catheter was advanced into the abdominal aorta. This is used to selectively catheterize the celiac axis. Attempt was made to advance the catheter over a guidewire.  A 5 [place] Sos catheter was used to selectively catheterize the celiac axis. Selective arteriography was performed through the catheter.  A 5 French Chung catheter was used to engage the celiac axis and common hepatic artery. Selective arteriography was performed at the level of the common hepatic artery.  A Renegade STC  microcatheter was advanced through the [facility] catheter and further into the common hepatic artery. Selective arteriography was performed through the micro catheter. The catheter was then advanced into the gastroduodenal artery and selective arteriography performed. Embolization of the gastroduodenal artery was then performed through the micro catheter with deployment of 0.018 inch Interlock coils. A total of 8 embolization coils were deployed ranging in diameter from 2 mm to 6 mm and of varying lengths. Additional angiography was performed through the micro catheter after embolization.  A high flow micro catheter was then utilized to catheterize the proper hepatic artery and additional selective arteriography performed. The left hepatic artery was then catheterized with the micro catheter and selective arteriography performed. A dose of 4 mCi of technetium 63m MAA was then injected into the left hepatic artery. Catheter and sheath were then removed and hemostasis obtained with manual compression and use of a V pad. The patient was then transported to nuclear medicine for additional nuclear medicine imaging of the chest and abdomen with lung shunt calculation.  COMPLICATIONS: None  FINDINGS: Due to tortuous course, the celiac axis was difficult to catheterize and advance a catheter into. Ultimately, the trunk was able to be securely accessed with  a Chung catheter. Common hepatic arteriography demonstrates a normally positioned gastroduodenal artery. Shortly beyond the gastroduodenal artery, a left hepatic artery is identified supplying lateral and medial segments of the left lobe.  Additional left hepatic arteriography demonstrates an enhancing lesion in the left lobe corresponding to the known residual metastatic lesion. There is a small right gastric artery emanating off of the proper hepatic artery near the branch point of the right and left hepatic arteries. This vessel was too small and tortuous to catheterize to  allow embolization.  The gastroduodenal artery was completely occluded with coils. MAA injection was performed in the left hepatic artery at its first bifurcation.  IMPRESSION: Hepatic arteriography demonstrates left hepatic artery supply to metastatic disease in the liver. The gastroduodenal artery was successfully embolized with coils. MAA injection was performed in the left hepatic artery to assess distribution by nuclear medicine scintigraphy and allow lung shunt calculation. The patient will continue to receive IV hydration following the procedure for approximately 6 hr.   Electronically Signed   By: Aletta Edouard M.D.   On: 10/10/2013 15:51   Ir US Guide Vasc Access Right  10/10/2013   CLINICAL DATA:  Metastatic colon carcinoma to the liver. The patient has been evaluated is a candidate for Yttrium-90 radioembolization of the liver to treat residual metastatic disease. Mapping arteriography with possible gastric/ duodenum supply embolization and shunt calculation is performed prior to radioembolization. Due to chronic kidney disease and chronic renal insufficiency, the patient was admitted approximately 24 hr prior to the procedure and was hydrated overnight with saline.  EXAM: 1. ULTRASOUND GUIDANCE FOR VASCULAR ACCESS OF THE RIGHT COMMON FEMORAL ARTERY 2. VISCERAL ARTERIOGRAPHY OF THE CELIAC AXIS 3. ADDITIONAL SELECTIVE ARTERIOGRAPHY OF THE COMMON HEPATIC ARTERY, GASTRODUODENAL ARTERY, PROPER HEPATIC ARTERY AND LEFT HEPATIC ARTERY 4. TRANSCATHETER COIL EMBOLIZATION OF THE GASTRODUODENAL ARTERY 5. MAA INJECTION INTO THE LEFT HEPATIC ARTERY FOR NUCLEAR MEDICINE IMAGING AND SHUNT CALCULATION.  MEDICATIONS: Prior to the procedure the patient received 3.375 g of IV Zosyn.  4 mg IV Versed sec, 100 mcg IV fentanyl.  Total Moderate Sedation Time:  105 minutes.  CONTRAST:  90 mL Visipaque 320  FLUOROSCOPY TIME:  30 min and 24 seconds.  PROCEDURE: Ultrasound was used to confirm patency of the right common femoral  artery. Under direct ultrasound guidance, access of the common femoral artery was performed with a micropuncture set. A 5 French sheath was then placed over a guidewire.  A 5 [place] cobra catheter was advanced into the abdominal aorta. This is used to selectively catheterize the celiac axis. Attempt was made to advance the catheter over a guidewire.  A 5 [place] Sos catheter was used to selectively catheterize the celiac axis. Selective arteriography was performed through the catheter.  A 5 French Chung catheter was used to engage the celiac axis and common hepatic artery. Selective arteriography was performed at the level of the common hepatic artery.  A Renegade STC microcatheter was advanced through the [facility] catheter and further into the common hepatic artery. Selective arteriography was performed through the micro catheter. The catheter was then advanced into the gastroduodenal artery and selective arteriography performed. Embolization of the gastroduodenal artery was then performed through the micro catheter with deployment of 0.018 inch Interlock coils. A total of 8 embolization coils were deployed ranging in diameter from 2 mm to 6 mm and of varying lengths. Additional angiography was performed through the micro catheter after embolization.  A high flow micro catheter was then utilized to  catheterize the proper hepatic artery and additional selective arteriography performed. The left hepatic artery was then catheterized with the micro catheter and selective arteriography performed. A dose of 4 mCi of technetium 17m MAA was then injected into the left hepatic artery. Catheter and sheath were then removed and hemostasis obtained with manual compression and use of a V pad. The patient was then transported to nuclear medicine for additional nuclear medicine imaging of the chest and abdomen with lung shunt calculation.  COMPLICATIONS: None  FINDINGS: Due to tortuous course, the celiac axis was difficult to  catheterize and advance a catheter into. Ultimately, the trunk was able to be securely accessed with a Chung catheter. Common hepatic arteriography demonstrates a normally positioned gastroduodenal artery. Shortly beyond the gastroduodenal artery, a left hepatic artery is identified supplying lateral and medial segments of the left lobe.  Additional left hepatic arteriography demonstrates an enhancing lesion in the left lobe corresponding to the known residual metastatic lesion. There is a small right gastric artery emanating off of the proper hepatic artery near the branch point of the right and left hepatic arteries. This vessel was too small and tortuous to catheterize to allow embolization.  The gastroduodenal artery was completely occluded with coils. MAA injection was performed in the left hepatic artery at its first bifurcation.  IMPRESSION: Hepatic arteriography demonstrates left hepatic artery supply to metastatic disease in the liver. The gastroduodenal artery was successfully embolized with coils. MAA injection was performed in the left hepatic artery to assess distribution by nuclear medicine scintigraphy and allow lung shunt calculation. The patient will continue to receive IV hydration following the procedure for approximately 6 hr.   Electronically Signed   By: Aletta Edouard M.D.   On: 10/10/2013 15:51   Ir Embo Arterial Not [place]  10/10/2013   CLINICAL DATA:  Metastatic colon carcinoma to the liver. The patient has been evaluated is a candidate for Yttrium-90 radioembolization of the liver to treat residual metastatic disease. Mapping arteriography with possible gastric/ duodenum supply embolization and shunt calculation is performed prior to radioembolization. Due to chronic kidney disease and chronic renal insufficiency, the patient was admitted approximately 24 hr prior to the procedure and was hydrated overnight with saline.  EXAM: 1. ULTRASOUND GUIDANCE FOR  VASCULAR ACCESS OF THE RIGHT COMMON FEMORAL ARTERY 2. VISCERAL ARTERIOGRAPHY OF THE CELIAC AXIS 3. ADDITIONAL SELECTIVE ARTERIOGRAPHY OF THE COMMON HEPATIC ARTERY, GASTRODUODENAL ARTERY, PROPER HEPATIC ARTERY AND LEFT HEPATIC ARTERY 4. TRANSCATHETER COIL EMBOLIZATION OF THE GASTRODUODENAL ARTERY 5. MAA INJECTION INTO THE LEFT HEPATIC ARTERY FOR NUCLEAR MEDICINE IMAGING AND SHUNT CALCULATION.  MEDICATIONS: Prior to the procedure the patient received 3.375 g of IV Zosyn.  4 mg IV Versed sec, 100 mcg IV fentanyl.  Total Moderate Sedation Time:  105 minutes.  CONTRAST:  90 mL Visipaque 320  FLUOROSCOPY TIME:  30 min and 24 seconds.  PROCEDURE: Ultrasound was used to confirm patency of the right common femoral artery. Under direct ultrasound guidance, access of the common femoral artery was performed with a micropuncture set. A 5 French sheath was then placed over a guidewire.  A 5 [place] cobra catheter was advanced into the abdominal aorta. This is used to selectively catheterize the celiac axis. Attempt was made to advance the catheter over a guidewire.  A 5 [place] Sos catheter was used to selectively catheterize the celiac axis. Selective arteriography was performed through the catheter.  A 5 French Chung catheter was used to engage the celiac  axis and common hepatic artery. Selective arteriography was performed at the level of the common hepatic artery.  A Renegade STC microcatheter was advanced through the [facility] catheter and further into the common hepatic artery. Selective arteriography was performed through the micro catheter. The catheter was then advanced into the gastroduodenal artery and selective arteriography performed. Embolization of the gastroduodenal artery was then performed through the micro catheter with deployment of 0.018 inch Interlock coils. A total of 8 embolization coils were deployed ranging in diameter from 2 mm to 6 mm and of varying lengths. Additional angiography was performed through the  micro catheter after embolization.  A high flow micro catheter was then utilized to catheterize the proper hepatic artery and additional selective arteriography performed. The left hepatic artery was then catheterized with the micro catheter and selective arteriography performed. A dose of 4 mCi of technetium 52m MAA was then injected into the left hepatic artery. Catheter and sheath were then removed and hemostasis obtained with manual compression and use of a V pad. The patient was then transported to nuclear medicine for additional nuclear medicine imaging of the chest and abdomen with lung shunt calculation.  COMPLICATIONS: None  FINDINGS: Due to tortuous course, the celiac axis was difficult to catheterize and advance a catheter into. Ultimately, the trunk was able to be securely accessed with a Chung catheter. Common hepatic arteriography demonstrates a normally positioned gastroduodenal artery. Shortly beyond the gastroduodenal artery, a left hepatic artery is identified supplying lateral and medial segments of the left lobe.  Additional left hepatic arteriography demonstrates an enhancing lesion in the left lobe corresponding to the known residual metastatic lesion. There is a small right gastric artery emanating off of the proper hepatic artery near the branch point of the right and left hepatic arteries. This vessel was too small and tortuous to catheterize to allow embolization.  The gastroduodenal artery was completely occluded with coils. MAA injection was performed in the left hepatic artery at its first bifurcation.  IMPRESSION: Hepatic arteriography demonstrates left hepatic artery supply to metastatic disease in the liver. The gastroduodenal artery was successfully embolized with coils. MAA injection was performed in the left hepatic artery to assess distribution by nuclear medicine scintigraphy and allow lung shunt calculation. The patient will continue to receive IV hydration following the  procedure for approximately 6 hr.   Electronically Signed   By: Aletta Edouard M.D.   On: 10/10/2013 15:51   Nm Fusion  10/10/2013   CLINICAL DATA:  Colorectal carcinoma of with hepatic metastasis. Left hepatic lobe injection.  EXAM: NUCLEAR MEDICINE LIVER SCAN; post processing 3D fusion.  TECHNIQUE: Abdominal images were obtained in multiple projections after intrahepatic arterial injection of radiopharmaceutical. SPECT imaging was performed. Lung shunt calculation was performed. Processing 3D fusion was performed.  RADIOPHARMACEUTICALS:  4MILLI CURIE MAA TECHNETIUM TO 32M ALBUMIN AGGREGATED  COMPARISON:  CT 08/06/2013  FINDINGS: The injected microaggregated albumin localizes primarily within the left hepatic lobe. No evidence of activity within the stomach, duodenum, or bowel.  Calculated shunt fraction to the lungs equals 6.4%.  IMPRESSION: 1. No significant extrahepatic radiotracer activity following intrahepatic arterial injection of MAA. 2. Lung shunt fraction equals 6.4%   Electronically Signed   By: Suzy Bouchard M.D.   On: 10/10/2013 14:05    Discharge Exam: Blood pressure 131/64, pulse 71, temperature 97.9 F (36.6 C), temperature source Oral, resp. rate 18, height 5\' 4"  (1.626 m), weight 170 lb (77.111 kg), SpO2 100.00%. The patient is awake, alert and  oriented. Chest is clear to auscultation bilaterally ;right upper chest wall Port-A-Cath is intact, clean and dry, nontender; heart with regular rate and rhythm. Abdomen soft, nontender, positive bowel sounds. Puncture site right common femoral artery clean and dry, nontender, no significant hematoma, intact gauze dressing; extremities with full range of motion and no edema, intact distal pulses.  Disposition: home  Discharge Instructions   Call MD for:  difficulty breathing, headache or visual disturbances    Complete by:  As directed      Call MD for:  extreme fatigue    Complete by:  As directed      Call MD for:  hives    Complete  by:  As directed      Call MD for:  persistant dizziness or light-headedness    Complete by:  As directed      Call MD for:  persistant nausea and vomiting    Complete by:  As directed      Call MD for:  redness, tenderness, or signs of infection (pain, swelling, redness, odor or green/yellow discharge around incision site)    Complete by:  As directed      Call MD for:  severe uncontrolled pain    Complete by:  As directed      Call MD for:  temperature >100.4    Complete by:  As directed      Change dressing (specify)    Complete by:  As directed   May remove bandage from right groin on 7/10 and apply bandaid to site for next 2-3 days; may wash area with soap and water     Diet - low sodium heart healthy    Complete by:  As directed      Discharge instructions    Complete by:  As directed   Follow up with Dr. Alen Blew and primary care physician as directed; xray will call you for your next procedure scheduled for 7/21; you will be admitted to hospital on 7/20 for hydration prior to Y-90 treatment; call [phone] or [phone] with any questions     Driving Restrictions    Complete by:  As directed   No driving for next 24 hours     Increase activity slowly    Complete by:  As directed      Lifting restrictions    Complete by:  As directed   No heavy lifting for next 48 hours     May shower / Bathe    Complete by:  As directed      May walk up steps    Complete by:  As directed             Medication List         allopurinol 300 MG tablet  Commonly known as:  ZYLOPRIM  Take 150 mg by mouth every morning.     amLODipine 5 MG tablet  Commonly known as:  NORVASC  Take 5 mg by mouth every morning.     Biotin 5000 MCG Caps  Take 5,000 mcg by mouth every morning.     capecitabine 500 MG tablet  Commonly known as:  XELODA  Take 1,000 mg by mouth as directed. Take 2 tablets twice a day for 14 days. Take one week off, then restart.     CoQ-10 200 MG Caps  Take 200 mg by  mouth at bedtime.     ferrous sulfate 325 (65 FE) MG tablet  Take 325 mg by mouth daily  with breakfast.     furosemide 20 MG tablet  Commonly known as:  LASIX  Take 20 mg by mouth every morning.     irbesartan 300 MG tablet  Commonly known as:  AVAPRO  Take 300 mg by mouth every morning.     levothyroxine 75 MCG tablet  Commonly known as:  SYNTHROID, LEVOTHROID  Take 75 mcg by mouth every morning.     lidocaine-prilocaine cream  Commonly known as:  EMLA  Apply 1 application topically daily as needed (Applies to port-a-cath.).     ondansetron 8 MG tablet  Commonly known as:  ZOFRAN  Take 8 mg by mouth every 8 (eight) hours as needed for nausea or vomiting.     rosuvastatin 10 MG tablet  Commonly known as:  CRESTOR  Take 10 mg by mouth every evening.           Follow-up Information   Follow up with [facility], [facility]. (You are scheduled for Y-90 treatment of your liver on 10/22/13 at [facility] ; you will be admitted to hospital on 10/21/13 for hydration prior to treatment; xray will call you closer to above date to give you further details; call [phone] /[phone] or [phone])    Specialty:  Oncology   Contact information:   [address] [place] [address] [phone]       Signed: Autumn Messing 10/10/2013, 3:50 PM

## 2013-10-10 NOTE — Discharge Summary (Signed)
Agree.  Patient seen and examined.  Tolerated angio and GDA embolization well today.  Will recheck renal function as outpatient next week.  Planned Y-90 treatment of left lobe on 7/21.  Will admit on 7/20 for IV hydration before.

## 2013-10-10 NOTE — Progress Notes (Signed)
UR Completed Gareth Fitzner Graves-Bigelow, RN,BSN [phone]  

## 2013-10-10 NOTE — H&P (Signed)
Agree.  For IV hydration prior to hepatic angio and pre-Y90 mapping and embolization.  Will hydrate after procedure as well for at least 6 hours.

## 2013-10-10 NOTE — Procedures (Signed)
Procedure:  Hepatic arteriography with embolization of gastroduodenal artery Access:  Right CFA, 5 Fr sheath Findings:  GDA embolized with coils ranging in diameter from 2 mm to 6 mm.  Good occlusion achieved. Left hepatic artery supplies tumor in liver.  MAA injection performed in left lobe. For NM imaging to follow.

## 2013-10-10 NOTE — Sedation Documentation (Signed)
5Fr sheath removed from R fem artery by Alfred Levins, RT.  Hemostasis achieved by manual pressure.  R groin level 0, 3+ RDP.

## 2013-10-10 NOTE — Sedation Documentation (Signed)
Pt transported to Room 1332 on bed port Nuc Med imaging.

## 2013-10-10 NOTE — Plan of Care (Signed)
Problem: Phase I Progression Outcomes Goal: OOB as tolerated unless otherwise ordered Outcome: Completed/Met Date Met:  10/10/13 Up as tol in room

## 2013-10-10 NOTE — Progress Notes (Signed)
Patient was stable at discharge. Port was deaccessed with saline and heparin flush. I reviewed discharge education with patient and family. They verbalized understanding and had no further questions. I returned patient's home medication to her before she left.

## 2013-10-10 NOTE — Progress Notes (Signed)
While pushing decadron through IV line patient c/o feeling "warm and itching all over".  Both feelings resolved in a few minutes after medications completed.

## 2013-10-14 ENCOUNTER — Other Ambulatory Visit: Payer: Self-pay | Admitting: Radiology

## 2013-10-15 ENCOUNTER — Other Ambulatory Visit: Payer: Self-pay | Admitting: *Deleted

## 2013-10-15 NOTE — Telephone Encounter (Signed)
THIS REFILL REQUEST FOR CAPECITABINE WAS PLACED IN DR.SHADAD'S ACTIVE WORK FOLDER. 

## 2013-10-16 ENCOUNTER — Other Ambulatory Visit: Payer: Self-pay | Admitting: Medical Oncology

## 2013-10-16 ENCOUNTER — Encounter (HOSPITAL_COMMUNITY): Payer: Self-pay | Admitting: Pharmacy Technician

## 2013-10-16 MED ORDER — CAPECITABINE 500 MG PO TABS: 1000.0000 mg | ORAL_TABLET | ORAL | Status: DC

## 2013-10-16 NOTE — Telephone Encounter (Signed)
Prescription refill for Xeloda faxed to [facility] @ [phone]

## 2013-10-17 ENCOUNTER — Ambulatory Visit (HOSPITAL_COMMUNITY)
Admission: RE | Admit: 2013-10-17 | Discharge: 2013-10-17 | Disposition: A | Discharge status: Home / Self Care | Payer: Medicare Other | Source: Ambulatory Visit | Attending: Interventional Radiology | Admitting: Interventional Radiology

## 2013-10-17 DIAGNOSIS — Z0189 Encounter for other specified special examinations: Secondary | ICD-10-CM | POA: Insufficient documentation

## 2013-10-17 LAB — BASIC METABOLIC PANEL
Anion gap: 16 — ABNORMAL HIGH (ref 5–15)
BUN: 33 mg/dL — AB (ref 6–23)
CALCIUM: 9.6 mg/dL (ref 8.4–10.5)
CO2: 21 mEq/L (ref 19–32)
Chloride: 103 mEq/L (ref 96–112)
Creatinine, Ser: 2.12 mg/dL — ABNORMAL HIGH (ref 0.50–1.10)
GFR, EST AFRICAN AMERICAN: 24 mL/min — AB (ref >90)
GFR, EST NON AFRICAN AMERICAN: 21 mL/min — AB (ref >90)
Glucose, Bld: 137 mg/dL — ABNORMAL HIGH (ref 70–99)
Potassium: 4.9 mEq/L (ref 3.7–5.3)
Sodium: 140 mEq/L (ref 137–147)

## 2013-10-17 NOTE — Telephone Encounter (Signed)
RECEIVED A FAX FROM [facility] CONCERNING A CONFIRMATION OF FACSIMILE RECEIPT. 

## 2013-10-18 ENCOUNTER — Other Ambulatory Visit: Payer: Self-pay | Admitting: Radiology

## 2013-10-21 ENCOUNTER — Encounter (HOSPITAL_COMMUNITY): Payer: Self-pay

## 2013-10-21 ENCOUNTER — Observation Stay (HOSPITAL_COMMUNITY)
Admission: RE | Admit: 2013-10-21 | Discharge: 2013-10-22 | Disposition: A | Discharge status: Home / Self Care | Payer: Medicare Other | Source: Ambulatory Visit | Attending: Interventional Radiology | Admitting: Interventional Radiology

## 2013-10-21 DIAGNOSIS — Z87891 Personal history of nicotine dependence: Secondary | ICD-10-CM | POA: Diagnosis not present

## 2013-10-21 DIAGNOSIS — E785 Hyperlipidemia, unspecified: Secondary | ICD-10-CM | POA: Diagnosis not present

## 2013-10-21 DIAGNOSIS — C20 Malignant neoplasm of rectum: Secondary | ICD-10-CM | POA: Diagnosis not present

## 2013-10-21 DIAGNOSIS — I129 Hypertensive chronic kidney disease with stage 1 through stage 4 chronic kidney disease, or unspecified chronic kidney disease: Secondary | ICD-10-CM | POA: Insufficient documentation

## 2013-10-21 DIAGNOSIS — N189 Chronic kidney disease, unspecified: Secondary | ICD-10-CM | POA: Insufficient documentation

## 2013-10-21 DIAGNOSIS — I714 Abdominal aortic aneurysm, without rupture, unspecified: Secondary | ICD-10-CM | POA: Diagnosis not present

## 2013-10-21 DIAGNOSIS — C787 Secondary malignant neoplasm of liver and intrahepatic bile duct: Secondary | ICD-10-CM | POA: Diagnosis not present

## 2013-10-21 DIAGNOSIS — Z79899 Other long term (current) drug therapy: Secondary | ICD-10-CM | POA: Insufficient documentation

## 2013-10-21 DIAGNOSIS — E039 Hypothyroidism, unspecified: Secondary | ICD-10-CM | POA: Diagnosis not present

## 2013-10-21 DIAGNOSIS — C189 Malignant neoplasm of colon, unspecified: Secondary | ICD-10-CM | POA: Diagnosis present

## 2013-10-21 HISTORY — PX: OTHER SURGICAL HISTORY: SHX169

## 2013-10-21 LAB — COMPREHENSIVE METABOLIC PANEL
ALT: 9 U/L (ref 0–35)
AST: 20 U/L (ref 0–37)
Albumin: 3.3 g/dL — ABNORMAL LOW (ref 3.5–5.2)
Alkaline Phosphatase: 52 U/L (ref 39–117)
Anion gap: 14 (ref 5–15)
BUN: 39 mg/dL — ABNORMAL HIGH (ref 6–23)
CALCIUM: 9.5 mg/dL (ref 8.4–10.5)
CO2: 20 mEq/L (ref 19–32)
Chloride: 104 mEq/L (ref 96–112)
Creatinine, Ser: 2.19 mg/dL — ABNORMAL HIGH (ref 0.50–1.10)
GFR calc non Af Amer: 20 mL/min — ABNORMAL LOW (ref >90)
GFR, EST AFRICAN AMERICAN: 23 mL/min — AB (ref >90)
GLUCOSE: 152 mg/dL — AB (ref 70–99)
Potassium: 4.3 mEq/L (ref 3.7–5.3)
Sodium: 138 mEq/L (ref 137–147)
Total Bilirubin: 0.3 mg/dL (ref 0.3–1.2)
Total Protein: 6.8 g/dL (ref 6.0–8.3)

## 2013-10-21 LAB — CBC WITH DIFFERENTIAL/PLATELET
BASOS ABS: 0 10*3/uL (ref 0.0–0.1)
BASOS PCT: 0 % (ref 0–1)
EOS PCT: 3 % (ref 0–5)
Eosinophils Absolute: 0.2 10*3/uL (ref 0.0–0.7)
HCT: 27.8 % — ABNORMAL LOW (ref 36.0–46.0)
Hemoglobin: 9.5 g/dL — ABNORMAL LOW (ref 12.0–15.0)
Lymphocytes Relative: 12 % (ref 12–46)
Lymphs Abs: 0.8 10*3/uL (ref 0.7–4.0)
MCH: 34.2 pg — AB (ref 26.0–34.0)
MCHC: 34.2 g/dL (ref 30.0–36.0)
MCV: 100 fL (ref 78.0–100.0)
Monocytes Absolute: 0.3 10*3/uL (ref 0.1–1.0)
Monocytes Relative: 5 % (ref 3–12)
Neutro Abs: 5.4 10*3/uL (ref 1.7–7.7)
Neutrophils Relative %: 80 % — ABNORMAL HIGH (ref 43–77)
PLATELETS: 294 10*3/uL (ref 150–400)
RBC: 2.78 MIL/uL — ABNORMAL LOW (ref 3.87–5.11)
RDW: 16.3 % — AB (ref 11.5–15.5)
WBC: 6.7 10*3/uL (ref 4.0–10.5)

## 2013-10-21 LAB — PROTIME-INR
INR: 1.01 (ref 0.00–1.49)
Prothrombin Time: 13.3 seconds (ref 11.6–15.2)

## 2013-10-21 MED ORDER — ONDANSETRON HCL 4 MG/2ML IJ SOLN
4.0000 mg | Freq: Once | INTRAMUSCULAR | Status: AC
  Administered 2013-10-22: 4 mg via INTRAVENOUS
  Filled 2013-10-21: qty 2

## 2013-10-21 MED ORDER — PIPERACILLIN-TAZOBACTAM 3.375 G IVPB
3.3750 g | Freq: Once | INTRAVENOUS | Status: DC
  Filled 2013-10-21: qty 50

## 2013-10-21 MED ORDER — PIPERACILLIN-TAZOBACTAM 3.375 G IVPB
3.3750 g | Freq: Once | INTRAVENOUS | Status: AC
  Administered 2013-10-22: 3.375 g via INTRAVENOUS
  Filled 2013-10-21 (×2): qty 50

## 2013-10-21 MED ORDER — SODIUM CHLORIDE 0.9 % IV SOLN
INTRAVENOUS | Status: DC
  Administered 2013-10-21 – 2013-10-22 (×2): via INTRAVENOUS

## 2013-10-21 MED ORDER — ALLOPURINOL 150 MG HALF TABLET
150.0000 mg | ORAL_TABLET | Freq: Every morning | ORAL | Status: DC
  Administered 2013-10-22: 150 mg via ORAL
  Filled 2013-10-21: qty 1

## 2013-10-21 MED ORDER — PANTOPRAZOLE SODIUM 40 MG IV SOLR
40.0000 mg | Freq: Once | INTRAVENOUS | Status: AC
  Administered 2013-10-22: 40 mg via INTRAVENOUS
  Filled 2013-10-21: qty 40

## 2013-10-21 MED ORDER — ONDANSETRON HCL 8 MG PO TABS: 8.0000 mg | ORAL_TABLET | Freq: Three times a day (TID) | ORAL | Status: DC | PRN

## 2013-10-21 MED ORDER — ATORVASTATIN CALCIUM 20 MG PO TABS
20.0000 mg | ORAL_TABLET | Freq: Every day | ORAL | Status: DC
  Administered 2013-10-21: 20 mg via ORAL
  Filled 2013-10-21 (×2): qty 1

## 2013-10-21 MED ORDER — AMLODIPINE BESYLATE 5 MG PO TABS
5.0000 mg | ORAL_TABLET | Freq: Every morning | ORAL | Status: DC
  Administered 2013-10-22: 5 mg via ORAL
  Filled 2013-10-21: qty 1

## 2013-10-21 MED ORDER — ONDANSETRON HCL 4 MG/2ML IJ SOLN: 4.0000 mg | Freq: Once | INTRAMUSCULAR | Status: DC

## 2013-10-21 MED ORDER — FERROUS SULFATE 325 (65 FE) MG PO TABS
325.0000 mg | ORAL_TABLET | Freq: Every day | ORAL | Status: DC
  Administered 2013-10-22: 325 mg via ORAL
  Filled 2013-10-21 (×2): qty 1

## 2013-10-21 MED ORDER — DIPHENHYDRAMINE HCL 50 MG/ML IJ SOLN
50.0000 mg | Freq: Once | INTRAMUSCULAR | Status: AC
  Administered 2013-10-22: 50 mg via INTRAVENOUS
  Filled 2013-10-21: qty 1

## 2013-10-21 MED ORDER — IRBESARTAN 300 MG PO TABS
300.0000 mg | ORAL_TABLET | Freq: Every morning | ORAL | Status: DC
  Administered 2013-10-22: 300 mg via ORAL
  Filled 2013-10-21: qty 1

## 2013-10-21 MED ORDER — DEXAMETHASONE SODIUM PHOSPHATE 4 MG/ML IJ SOLN
10.0000 mg | Freq: Once | INTRAMUSCULAR | Status: AC
  Administered 2013-10-22: 10 mg via INTRAVENOUS
  Filled 2013-10-21: qty 2.5

## 2013-10-21 MED ORDER — LEVOTHYROXINE SODIUM 75 MCG PO TABS
75.0000 ug | ORAL_TABLET | Freq: Every day | ORAL | Status: DC
Start: 2013-10-22 — End: 2013-10-22
  Administered 2013-10-22: 75 ug via ORAL
  Filled 2013-10-21 (×2): qty 1

## 2013-10-21 MED ORDER — LIDOCAINE-PRILOCAINE 2.5-2.5 % EX CREA: 1.0000 "application " | TOPICAL_CREAM | Freq: Every day | CUTANEOUS | Status: DC | PRN

## 2013-10-21 MED ORDER — FUROSEMIDE 20 MG PO TABS
20.0000 mg | ORAL_TABLET | Freq: Every morning | ORAL | Status: DC
  Administered 2013-10-22: 20 mg via ORAL
  Filled 2013-10-21 (×2): qty 1

## 2013-10-21 MED ORDER — PANTOPRAZOLE SODIUM 40 MG IV SOLR
40.0000 mg | Freq: Once | INTRAVENOUS | Status: DC
  Filled 2013-10-21: qty 40

## 2013-10-21 NOTE — H&P (Signed)
Ashley Davenport is an 80 y.o. female.   Chief Complaint: "I'm having another procedure done on my liver" HPI: Patient with history of metastatic rectal carcinoma to liver ,CKD and s/p mapping hepatic arteriography with embolization of GDA on 10/10/2013 presents today for IV hydration prior to planned hepatic Y-90 radioembolization treatment on 10/22/13.  Past Medical History  Diagnosis Date  . AAA (abdominal aortic aneurysm) 2011  . Hyperlipidemia   . Hypertension   . Shingles     POST HERPATIC PAIN FROM SHINGLES  . Cancer     Rectal  . History of acute gouty arthritis   . Rectal cancer   . Kidney cysts   . Anemia     HX OF ANEMIA WITH CHEMO 2008  . Hypothyroidism     Past Surgical History  Procedure Laterality Date  . Abdominal hysterectomy  1961    total  . Tonsillectomy  1949  . Bunionectomy Bilateral   . Rectal tumor removed  2008    CANCEROUS  . Abdominal aorta aneurysm repair -stent  2011  . Portacath placement N/A 09/24/2012    Procedure: INSERTION PORT-A-CATH;  Surgeon: Stark Klein, MD;  Location: [facility];  Service: General;  Laterality: N/A;  . Abdominal aortic aneurysm repair      Family History  Problem Relation Age of Onset  . Hypertension Mother   . Heart disease Mother   . Heart attack Mother   . Hypertension Father   . Heart disease Father   . Diabetes Father   . Heart attack Father   . Cancer Sister     ovarian  . Diabetes Sister   . Hyperlipidemia Sister   . Hypertension Sister   . Cancer Cousin     breast  . Cancer Cousin     breast  . Diabetes Daughter   . Hyperlipidemia Daughter   . Hypertension Daughter    Social History:  reports that she quit smoking about 25 years ago. Her smoking use included Cigarettes. She has a 20 pack-year smoking history. She has never used smokeless tobacco. She reports that she drinks alcohol. She reports that she does not use illicit drugs.  Allergies:  Allergies  Allergen Reactions  . Shellfish Allergy  Itching and Rash    Feels itching internally.  . Iodine Itching  . Meperidine Hcl Other (See Comments)    REACTION: Hypotension  . Tramadol Hcl Swelling    REACTION: Tongue and lips swell    Medications Prior to Admission  Medication Sig Dispense Refill  . allopurinol (ZYLOPRIM) 300 MG tablet Take 150 mg by mouth every morning.      Marland Kitchen amLODipine (NORVASC) 5 MG tablet Take 5 mg by mouth every morning.       . Biotin 5000 MCG CAPS Take 5,000 mcg by mouth every morning.       . capecitabine (XELODA) 500 MG tablet Take 1,000 mg/m2 by mouth 2 (two) times daily after a meal.      . Coenzyme Q10 (COQ-10) 200 MG CAPS Take 200 mg by mouth at bedtime.       . ferrous sulfate 325 (65 FE) MG tablet Take 325 mg by mouth daily with breakfast.      . furosemide (LASIX) 20 MG tablet Take 20 mg by mouth every morning.       . irbesartan (AVAPRO) 300 MG tablet Take 300 mg by mouth every morning.      Marland Kitchen levothyroxine (SYNTHROID, LEVOTHROID) 75 MCG tablet Take 75  mcg by mouth every morning.       . lidocaine-prilocaine (EMLA) cream Apply 1 application topically daily as needed (Applies to port-a-cath.).      Marland Kitchen ondansetron (ZOFRAN) 8 MG tablet Take 8 mg by mouth every 8 (eight) hours as needed for nausea or vomiting.      . rosuvastatin (CRESTOR) 10 MG tablet Take 10 mg by mouth every evening.        No results found for this or any previous visit (from the past 48 hour(s)). No results found.  Review of Systems  Constitutional: Negative for fever and chills.  Respiratory: Negative for cough, hemoptysis and shortness of breath.   Cardiovascular: Negative for chest pain.  Gastrointestinal: Negative for nausea, vomiting, abdominal pain and blood in stool.  Genitourinary: Negative for dysuria and hematuria.  Musculoskeletal: Negative for back pain.  Neurological: Negative for headaches.  Endo/Heme/Allergies: Does not bruise/bleed easily.    Blood pressure 149/67, pulse 72, temperature 97.7 F (36.5  C), temperature source Oral, resp. rate 18, SpO2 100.00%. Physical Exam  Constitutional: She is oriented to person, place, and time. She appears well-developed and well-nourished.  Cardiovascular: Normal rate and regular rhythm.   Respiratory: Effort normal and breath sounds normal.  Clean, intact rt chest wall PAC  GI: Soft. Bowel sounds are normal. There is no tenderness.  Prev Rt CFA puncture site clean and dry,NT, no hematoma  Musculoskeletal: Normal range of motion. She exhibits no edema.  Neurological: She is alert and oriented to person, place, and time.  Skin: Skin is warm and dry.  Psychiatric: She has a normal mood and affect.     Assessment/Plan  Patient with history of metastatic rectal carcinoma to liver ,CKD and s/p mapping hepatic arteriography with embolization of GDA on 10/10/2013 presents today for IV hydration prior to planned hepatic Y-90 radioembolization treatment on 10/22/13. Details/risks of procedure d/w pt /family with their understanding and consent.  Ashley Davenport,D KEVIN 10/21/2013, 11:09 AM

## 2013-10-22 ENCOUNTER — Encounter (HOSPITAL_COMMUNITY)
Admission: RE | Admit: 2013-10-22 | Discharge: 2013-10-22 | Disposition: A | Discharge status: Home / Self Care | Payer: Medicare Other | Source: Ambulatory Visit | Attending: Interventional Radiology | Admitting: Interventional Radiology

## 2013-10-22 ENCOUNTER — Ambulatory Visit (HOSPITAL_COMMUNITY)
Admission: RE | Admit: 2013-10-22 | Payer: Medicare Other | Source: Ambulatory Visit | Admitting: Interventional Radiology

## 2013-10-22 DIAGNOSIS — C20 Malignant neoplasm of rectum: Secondary | ICD-10-CM

## 2013-10-22 DIAGNOSIS — C787 Secondary malignant neoplasm of liver and intrahepatic bile duct: Secondary | ICD-10-CM

## 2013-10-22 LAB — COMPREHENSIVE METABOLIC PANEL
ALK PHOS: 50 U/L (ref 39–117)
ALT: 7 U/L (ref 0–35)
AST: 16 U/L (ref 0–37)
Albumin: 2.8 g/dL — ABNORMAL LOW (ref 3.5–5.2)
Anion gap: 11 (ref 5–15)
BUN: 38 mg/dL — ABNORMAL HIGH (ref 6–23)
CALCIUM: 9.2 mg/dL (ref 8.4–10.5)
CO2: 21 meq/L (ref 19–32)
Chloride: 105 mEq/L (ref 96–112)
Creatinine, Ser: 2.13 mg/dL — ABNORMAL HIGH (ref 0.50–1.10)
GFR calc Af Amer: 24 mL/min — ABNORMAL LOW (ref >90)
GFR, EST NON AFRICAN AMERICAN: 21 mL/min — AB (ref >90)
Glucose, Bld: 110 mg/dL — ABNORMAL HIGH (ref 70–99)
Potassium: 4.9 mEq/L (ref 3.7–5.3)
SODIUM: 137 meq/L (ref 137–147)
TOTAL PROTEIN: 6.1 g/dL (ref 6.0–8.3)
Total Bilirubin: 0.2 mg/dL — ABNORMAL LOW (ref 0.3–1.2)

## 2013-10-22 MED ORDER — ONDANSETRON HCL 4 MG/2ML IJ SOLN
INTRAMUSCULAR | Status: AC
  Filled 2013-10-22: qty 2

## 2013-10-22 MED ORDER — MIDAZOLAM HCL 2 MG/2ML IJ SOLN
INTRAMUSCULAR | Status: AC
  Filled 2013-10-22: qty 8

## 2013-10-22 MED ORDER — HEPARIN SOD (PORK) LOCK FLUSH 100 UNIT/ML IV SOLN
500.0000 [IU] | INTRAVENOUS | Status: AC | PRN
  Administered 2013-10-22: 500 [IU]
  Filled 2013-10-22: qty 5

## 2013-10-22 MED ORDER — DIPHENHYDRAMINE HCL 50 MG/ML IJ SOLN: 50.0000 mg | Freq: Once | INTRAMUSCULAR | Status: AC

## 2013-10-22 MED ORDER — MIDAZOLAM HCL 2 MG/2ML IJ SOLN
INTRAMUSCULAR | Status: AC | PRN
  Administered 2013-10-22 (×2): 1 mg via INTRAVENOUS

## 2013-10-22 MED ORDER — KETOROLAC TROMETHAMINE 30 MG/ML IJ SOLN
INTRAMUSCULAR | Status: AC
  Administered 2013-10-22: 30 mg via INTRAVENOUS
  Filled 2013-10-22: qty 1

## 2013-10-22 MED ORDER — FENTANYL CITRATE 0.05 MG/ML IJ SOLN
INTRAMUSCULAR | Status: AC | PRN
  Administered 2013-10-22 (×2): 50 ug via INTRAVENOUS

## 2013-10-22 MED ORDER — IOHEXOL 300 MG/ML  SOLN: 1.0000 mL | Freq: Once | INTRAMUSCULAR | Status: DC | PRN

## 2013-10-22 MED ORDER — IODIXANOL 320 MG/ML IV SOLN
100.0000 mL | Freq: Once | INTRAVENOUS | Status: AC | PRN
  Administered 2013-10-22: 45 mL via INTRAVENOUS

## 2013-10-22 MED ORDER — IOHEXOL 300 MG/ML  SOLN
80.0000 mL | Freq: Once | INTRAMUSCULAR | Status: AC | PRN
  Administered 2013-10-22: 17 mL via INTRA_ARTERIAL

## 2013-10-22 MED ORDER — YTTRIUM 90 INJECTION: 15.2000 | INJECTION | Freq: Once | INTRAVENOUS | Status: DC

## 2013-10-22 MED ORDER — ONDANSETRON HCL 4 MG/2ML IJ SOLN
INTRAMUSCULAR | Status: AC | PRN
  Administered 2013-10-22: 4 mg via INTRAVENOUS

## 2013-10-22 MED ORDER — FENTANYL CITRATE 0.05 MG/ML IJ SOLN
INTRAMUSCULAR | Status: AC
  Filled 2013-10-22: qty 8

## 2013-10-22 MED ORDER — SODIUM CHLORIDE 0.9 % IV SOLN
INTRAVENOUS | Status: DC
  Administered 2013-10-22: 12:00:00 via INTRAVENOUS

## 2013-10-22 MED ORDER — PANTOPRAZOLE SODIUM 40 MG IV SOLR
INTRAVENOUS | Status: AC
  Administered 2013-10-22: 40 mg via INTRAVENOUS
  Filled 2013-10-22: qty 40

## 2013-10-22 MED ORDER — DEXAMETHASONE SODIUM PHOSPHATE 10 MG/ML IJ SOLN
INTRAMUSCULAR | Status: AC
  Administered 2013-10-22: 10 mg via INTRAVENOUS
  Filled 2013-10-22: qty 1

## 2013-10-22 NOTE — Progress Notes (Signed)
Pt. Has been discharged home. Pt. Was given her discharge instructions and all questions were answered. Pt. Is to be transported home by family.

## 2013-10-22 NOTE — Discharge Summary (Signed)
Physician Discharge Summary  Patient ID: Ashley Davenport MRN: [id number] DOB/AGE: 11/17/1930 80 y.o.  Admit date: 10/21/2013 Discharge date: 10/22/2013  Admission Diagnoses: Metastatic rectal carcinoma to the liver, chronic kidney disease  Discharge Diagnoses: Metastatic rectal carcinoma to the liver, status post left  hepatic arterial radioembolization  with 19 m Ci Yttrium 90 microspheres via IV conscious sedation on 10/22/2013; chronic kidney disease with IV hydration pre-and post Y 90 hepatic radioembolization. Active Problems:   Metastatic colon cancer to liver  Past Medical History  Diagnosis Date  . AAA (abdominal aortic aneurysm) 2011  . Hyperlipidemia   . Hypertension   . Shingles     POST HERPATIC PAIN FROM SHINGLES  . Cancer     Rectal  . History of acute gouty arthritis   . Rectal cancer   . Kidney cysts   . Anemia     HX OF ANEMIA WITH CHEMO 2008  . Hypothyroidism      Discharged Condition:good  Hospital Course: Ms. Finder is an 80 year old black female who was recently referred to the interventional radiology service by Dr. Jake Samples should not for consultation regarding possible transcatheter versus percutaneous treatment options for metastatic rectal carcinoma to the liver ( prior chemoradiation). She was evaluated by Dr. Aletta Edouard  and deemed to be a suitable candidate for Y- 90 radioembolization treatment of an isolated left lobe liver metastasis. There is no current evidence of additional metastatic disease. The lesion is partially calcified, predominately subcapsular and located within the medial left lobe with a component crossing just into the lateral segment. It currently measures approximately 4.9 x 5.8 cm. The patient also has a known history of chronic kidney disease and is followed by Dr. Erling Cruz. Upon his recommendation the patient was admitted to the hospital on 10/21/2013 for IV hydration prior to Y90 hepatic radioembolization. Creatinine on  day of admission was 2.19 and 2.13 the day of the procedure. Additional lab studies as below. Patient is status post mapping hepatic arteriography with embolization of the gastroduodenal artery on 10/10/2013. On 10/22/2013 patient underwent successful left  hepatic arterial radioembolization with 19 mCi Y 90 microspheres via right CFA access. The procedure was performed with IV conscious sedation and without immediate complications. The patient was observed for an additional 6 hours post procedure. She tolerated her diet well and was able to void and ambulate without difficulty . The patient was seen by Dr. Kathlene Cote post procedure and deemed stable for discharge. She will followup with Dr. Kathlene Cote in the [place] clinic in approximately 4 weeks. She was given post Y-90 discharge instructions and was told to contact our service with any additional questions or concerns . She will continue current follow up with Drs. Shadad/Powell as scheduled.  Consults:  none  Significant Diagnostic Studies:   Results for orders placed during the hospital encounter of 12/75/17  BASIC METABOLIC PANEL      Result Value Ref Range   Sodium 140  137 - 147 mEq/L   Potassium 4.9  3.7 - 5.3 mEq/L   Chloride 103  96 - 112 mEq/L   CO2 21  19 - 32 mEq/L   Glucose, Bld 137 (*) 70 - 99 mg/dL   BUN 33 (*) 6 - 23 mg/dL   Creatinine, Ser 2.12 (*) 0.50 - 1.10 mg/dL   Calcium 9.6  8.4 - 10.5 mg/dL   GFR calc non Af Amer 21 (*) >90 mL/min   GFR calc Af Amer 24 (*) >90 mL/min  Anion gap 16 (*) 5 - 15  CBC WITH DIFFERENTIAL      Result Value Ref Range   WBC 6.7  4.0 - 10.5 K/uL   RBC 2.78 (*) 3.87 - 5.11 MIL/uL   Hemoglobin 9.5 (*) 12.0 - 15.0 g/dL   HCT 27.8 (*) 36.0 - 46.0 %   MCV 100.0  78.0 - 100.0 fL   MCH 34.2 (*) 26.0 - 34.0 pg   MCHC 34.2  30.0 - 36.0 g/dL   RDW 16.3 (*) 11.5 - 15.5 %   Platelets 294  150 - 400 K/uL   Neutrophils Relative % 80 (*) 43 - 77 %   Neutro Abs 5.4  1.7 - 7.7 K/uL   Lymphocytes Relative 12  12 -  46 %   Lymphs Abs 0.8  0.7 - 4.0 K/uL   Monocytes Relative 5  3 - 12 %   Monocytes Absolute 0.3  0.1 - 1.0 K/uL   Eosinophils Relative 3  0 - 5 %   Eosinophils Absolute 0.2  0.0 - 0.7 K/uL   Basophils Relative 0  0 - 1 %   Basophils Absolute 0.0  0.0 - 0.1 K/uL  COMPREHENSIVE METABOLIC PANEL      Result Value Ref Range   Sodium 138  137 - 147 mEq/L   Potassium 4.3  3.7 - 5.3 mEq/L   Chloride 104  96 - 112 mEq/L   CO2 20  19 - 32 mEq/L   Glucose, Bld 152 (*) 70 - 99 mg/dL   BUN 39 (*) 6 - 23 mg/dL   Creatinine, Ser 2.19 (*) 0.50 - 1.10 mg/dL   Calcium 9.5  8.4 - 10.5 mg/dL   Total Protein 6.8  6.0 - 8.3 g/dL   Albumin 3.3 (*) 3.5 - 5.2 g/dL   AST 20  0 - 37 U/L   ALT 9  0 - 35 U/L   Alkaline Phosphatase 52  39 - 117 U/L   Total Bilirubin 0.3  0.3 - 1.2 mg/dL   GFR calc non Af Amer 20 (*) >90 mL/min   GFR calc Af Amer 23 (*) >90 mL/min   Anion gap 14  5 - 15  PROTIME-INR      Result Value Ref Range   Prothrombin Time 13.3  11.6 - 15.2 seconds   INR 1.01  0.00 - 1.49  COMPREHENSIVE METABOLIC PANEL      Result Value Ref Range   Sodium 137  137 - 147 mEq/L   Potassium 4.9  3.7 - 5.3 mEq/L   Chloride 105  96 - 112 mEq/L   CO2 21  19 - 32 mEq/L   Glucose, Bld 110 (*) 70 - 99 mg/dL   BUN 38 (*) 6 - 23 mg/dL   Creatinine, Ser 2.13 (*) 0.50 - 1.10 mg/dL   Calcium 9.2  8.4 - 10.5 mg/dL   Total Protein 6.1  6.0 - 8.3 g/dL   Albumin 2.8 (*) 3.5 - 5.2 g/dL   AST 16  0 - 37 U/L   ALT 7  0 - 35 U/L   Alkaline Phosphatase 50  39 - 117 U/L   Total Bilirubin 0.2 (*) 0.3 - 1.2 mg/dL   GFR calc non Af Amer 21 (*) >90 mL/min   GFR calc Af Amer 24 (*) >90 mL/min   Anion gap 11  5 - 15     Treatments: Nm Liver Img Spect  10/22/2013   CLINICAL DATA:  Metastatic colon cancer  EXAM: NUCLEAR MEDICINE LIVER SCAN  TECHNIQUE: Abdominal images were obtained in multiple projections after intravenous injection of radiopharmaceutical.  RADIOPHARMACEUTICALS:  15.2 mCi of Y-90  COMPARISON:   10/10/2013  FINDINGS: Bremsstrahlung scan shows radiotracer activity localizing to the left hepatic lobe. No abnormal extra hepatic activity identified.  IMPRESSION: 1. Radiotracer activity localizes to the left hepatic lobe. No extrahepatic activity identified.   Electronically Signed   By: Kerby Moors M.D.   On: 10/22/2013 12:06   Nm Liver Img Spect  10/10/2013   CLINICAL DATA:  Colorectal carcinoma of with hepatic metastasis. Left hepatic lobe injection.  EXAM: NUCLEAR MEDICINE LIVER SCAN; post processing 3D fusion.  TECHNIQUE: Abdominal images were obtained in multiple projections after intrahepatic arterial injection of radiopharmaceutical. SPECT imaging was performed. Lung shunt calculation was performed. Processing 3D fusion was performed.  RADIOPHARMACEUTICALS:  4MILLI CURIE MAA TECHNETIUM TO 6M ALBUMIN AGGREGATED  COMPARISON:  CT 08/06/2013  FINDINGS: The injected microaggregated albumin localizes primarily within the left hepatic lobe. No evidence of activity within the stomach, duodenum, or bowel.  Calculated shunt fraction to the lungs equals 6.4%.  IMPRESSION: 1. No significant extrahepatic radiotracer activity following intrahepatic arterial injection of MAA. 2. Lung shunt fraction equals 6.4%   Electronically Signed   By: Suzy Bouchard M.D.   On: 10/10/2013 14:05   Ir Angiogram Visceral Selective  10/22/2013   CLINICAL DATA:  Metastatic colon cancer to the liver with residual metastatic disease in the left lobe of the liver.  EXAM: 1. ULTRASOUND GUIDANCE FOR VASCULAR ACCESS OF THE RIGHT COMMON FEMORAL ARTERY 2. HEPATIC ARTERIOGRAPHY WITH SELECTIVE CATHETERIZATION OF THE CELIAC AXIS AND CELIAC ARTERIOGRAPHY 3. SELECTIVE ARTERIOGRAPHY OF THE COMMON HEPATIC ARTERY 4. SELECTIVE ARTERIOGRAPHY OF THE LEFT HEPATIC ARTERY 5. TRANSCATHETER YTTRIUM-90 RADIOEMBOLIZATION OF THE LEFT HEPATIC ARTERY TO TREAT METASTATIC COLON CARCINOMA TO THE LIVER 6. FOLLOW-UP ANGIOGRAPHY AFTER LEFT HEPATIC  RADIOEMBOLIZATION  FLUOROSCOPY TIME:  13 minutes  MEDICATIONS AND MEDICAL HISTORY: 2.0 mg IV Versed; 100 mcg IV Fentanyl.  Additional Medications: 50 mg IV Benadryl, 3.375 grams IV Zosyn, 20 mg IV Decadron, 40 mg IV Protonix, 4 mg IV Zofran, 30 mg IV Toradol  Y-90 dose: 19 mCi  ANESTHESIA/SEDATION: Moderate sedation time: 60 minutes  CONTRAST:  45 mL Visipaque 320 for arteriography and 17 mL Omnipaque 300 during radioembolization  PROCEDURE: The procedure, risks, benefits, and alternatives were explained to the patient. Questions regarding the procedure were encouraged and answered. The patient understands and consents to the procedure.  The right groin was prepped with Betadine in a sterile fashion, and a sterile drape was applied covering the operative field. A sterile gown and sterile gloves were used for the procedure. Local anesthesia was provided with 1% Lidocaine. Ultrasound image documentation was performed. Ultrasound was used to confirm patency of the right common femoral artery.  Access of the right common femoral artery was performed under ultrasound guidance with a micropuncture set. A 5-French sheath was placed. A 5-French Chung catheter was advanced and used to selectively catheterize the celiac axis. Selective arteriography was performed. A coaxial microcatheter was then advanced into the common hepatic artery and selective arteriography performed.  The microcatheter was further advanced into the left hepatic artery. Selective arteriography was performed. Radioembolization was performed with Yttrium-90 SIR Spheres. Particles were administered via a microcatheter utilizing a completely enclosed system. Monitoring of antegrade flow was performed during administration under fluoroscopy with use of contrast intermittently. After administration of the first dose of particles, the microcatheter was removed  and discarded along with the attached tubing and particle vial.  Femoral puncture site was assessed  with oblique arteriography. Arteriotomy closure was performed with the Cordis ExoSeal device.  FINDINGS: Initial arteriography shows durable occlusion of the gastroduodenal artery after prior coil embolization. The left hepatic artery shows stable patency. Radioembolization was performed of the left hepatic artery with antegrade flow maintained throughout treatment.  Arteriotomy closure was initially successful in establishing hemostasis. The patient will recover for 6 hours after the procedure.  COMPLICATIONS: None  IMPRESSION: Hepatic arterial radioembolization performed with Yttrium-90 microspheres. The left hepatic artery was treated with a prescribed dose of 19 mCi of Yttrium-90 spheres. Initial clinical follow-up will be performed in 4 weeks.   Electronically Signed   By: Aletta Edouard M.D.   On: 10/22/2013 11:29   Ir Angiogram Visceral Selective  10/10/2013   CLINICAL DATA:  Metastatic colon carcinoma to the liver. The patient has been evaluated is a candidate for Yttrium-90 radioembolization of the liver to treat residual metastatic disease. Mapping arteriography with possible gastric/ duodenum supply embolization and shunt calculation is performed prior to radioembolization. Due to chronic kidney disease and chronic renal insufficiency, the patient was admitted approximately 24 hr prior to the procedure and was hydrated overnight with saline.  EXAM: 1. ULTRASOUND GUIDANCE FOR VASCULAR ACCESS OF THE RIGHT COMMON FEMORAL ARTERY 2. VISCERAL ARTERIOGRAPHY OF THE CELIAC AXIS 3. ADDITIONAL SELECTIVE ARTERIOGRAPHY OF THE COMMON HEPATIC ARTERY, GASTRODUODENAL ARTERY, PROPER HEPATIC ARTERY AND LEFT HEPATIC ARTERY 4. TRANSCATHETER COIL EMBOLIZATION OF THE GASTRODUODENAL ARTERY 5. MAA INJECTION INTO THE LEFT HEPATIC ARTERY FOR NUCLEAR MEDICINE IMAGING AND SHUNT CALCULATION.  MEDICATIONS: Prior to the procedure the patient received 3.375 g of IV Zosyn.  4 mg IV Versed sec, 100 mcg IV fentanyl.  Total Moderate  Sedation Time:  105 minutes.  CONTRAST:  90 mL Visipaque 320  FLUOROSCOPY TIME:  30 min and 24 seconds.  PROCEDURE: Ultrasound was used to confirm patency of the right common femoral artery. Under direct ultrasound guidance, access of the common femoral artery was performed with a micropuncture set. A 5 French sheath was then placed over a guidewire.  A 5 [place] cobra catheter was advanced into the abdominal aorta. This is used to selectively catheterize the celiac axis. Attempt was made to advance the catheter over a guidewire.  A 5 [place] Sos catheter was used to selectively catheterize the celiac axis. Selective arteriography was performed through the catheter.  A 5 French Chung catheter was used to engage the celiac axis and common hepatic artery. Selective arteriography was performed at the level of the common hepatic artery.  A Renegade STC microcatheter was advanced through the [facility] catheter and further into the common hepatic artery. Selective arteriography was performed through the micro catheter. The catheter was then advanced into the gastroduodenal artery and selective arteriography performed. Embolization of the gastroduodenal artery was then performed through the micro catheter with deployment of 0.018 inch Interlock coils. A total of 8 embolization coils were deployed ranging in diameter from 2 mm to 6 mm and of varying lengths. Additional angiography was performed through the micro catheter after embolization.  A high flow micro catheter was then utilized to catheterize the proper hepatic artery and additional selective arteriography performed. The left hepatic artery was then catheterized with the micro catheter and selective arteriography performed. A dose of 4 mCi of technetium 60m MAA was then injected into the left hepatic artery. Catheter and sheath were then removed and hemostasis obtained with  manual compression and use of a V pad. The patient was then transported to nuclear medicine for  additional nuclear medicine imaging of the chest and abdomen with lung shunt calculation.  COMPLICATIONS: None  FINDINGS: Due to tortuous course, the celiac axis was difficult to catheterize and advance a catheter into. Ultimately, the trunk was able to be securely accessed with a Chung catheter. Common hepatic arteriography demonstrates a normally positioned gastroduodenal artery. Shortly beyond the gastroduodenal artery, a left hepatic artery is identified supplying lateral and medial segments of the left lobe.  Additional left hepatic arteriography demonstrates an enhancing lesion in the left lobe corresponding to the known residual metastatic lesion. There is a small right gastric artery emanating off of the proper hepatic artery near the branch point of the right and left hepatic arteries. This vessel was too small and tortuous to catheterize to allow embolization.  The gastroduodenal artery was completely occluded with coils. MAA injection was performed in the left hepatic artery at its first bifurcation.  IMPRESSION: Hepatic arteriography demonstrates left hepatic artery supply to metastatic disease in the liver. The gastroduodenal artery was successfully embolized with coils. MAA injection was performed in the left hepatic artery to assess distribution by nuclear medicine scintigraphy and allow lung shunt calculation. The patient will continue to receive IV hydration following the procedure for approximately 6 hr.   Electronically Signed   By: Aletta Edouard M.D.   On: 10/10/2013 15:51   Ir Angiogram Selective Each Additional Vessel  10/22/2013   CLINICAL DATA:  Metastatic colon cancer to the liver with residual metastatic disease in the left lobe of the liver.  EXAM: 1. ULTRASOUND GUIDANCE FOR VASCULAR ACCESS OF THE RIGHT COMMON FEMORAL ARTERY 2. HEPATIC ARTERIOGRAPHY WITH SELECTIVE CATHETERIZATION OF THE CELIAC AXIS AND CELIAC ARTERIOGRAPHY 3. SELECTIVE ARTERIOGRAPHY OF THE COMMON HEPATIC ARTERY 4.  SELECTIVE ARTERIOGRAPHY OF THE LEFT HEPATIC ARTERY 5. TRANSCATHETER YTTRIUM-90 RADIOEMBOLIZATION OF THE LEFT HEPATIC ARTERY TO TREAT METASTATIC COLON CARCINOMA TO THE LIVER 6. FOLLOW-UP ANGIOGRAPHY AFTER LEFT HEPATIC RADIOEMBOLIZATION  FLUOROSCOPY TIME:  13 minutes  MEDICATIONS AND MEDICAL HISTORY: 2.0 mg IV Versed; 100 mcg IV Fentanyl.  Additional Medications: 50 mg IV Benadryl, 3.375 grams IV Zosyn, 20 mg IV Decadron, 40 mg IV Protonix, 4 mg IV Zofran, 30 mg IV Toradol  Y-90 dose: 19 mCi  ANESTHESIA/SEDATION: Moderate sedation time: 60 minutes  CONTRAST:  45 mL Visipaque 320 for arteriography and 17 mL Omnipaque 300 during radioembolization  PROCEDURE: The procedure, risks, benefits, and alternatives were explained to the patient. Questions regarding the procedure were encouraged and answered. The patient understands and consents to the procedure.  The right groin was prepped with Betadine in a sterile fashion, and a sterile drape was applied covering the operative field. A sterile gown and sterile gloves were used for the procedure. Local anesthesia was provided with 1% Lidocaine. Ultrasound image documentation was performed. Ultrasound was used to confirm patency of the right common femoral artery.  Access of the right common femoral artery was performed under ultrasound guidance with a micropuncture set. A 5-French sheath was placed. A 5-French Chung catheter was advanced and used to selectively catheterize the celiac axis. Selective arteriography was performed. A coaxial microcatheter was then advanced into the common hepatic artery and selective arteriography performed.  The microcatheter was further advanced into the left hepatic artery. Selective arteriography was performed. Radioembolization was performed with Yttrium-90 SIR Spheres. Particles were administered via a microcatheter utilizing a completely enclosed system. Monitoring of antegrade flow  was performed during administration under fluoroscopy with  use of contrast intermittently. After administration of the first dose of particles, the microcatheter was removed and discarded along with the attached tubing and particle vial.  Femoral puncture site was assessed with oblique arteriography. Arteriotomy closure was performed with the Cordis ExoSeal device.  FINDINGS: Initial arteriography shows durable occlusion of the gastroduodenal artery after prior coil embolization. The left hepatic artery shows stable patency. Radioembolization was performed of the left hepatic artery with antegrade flow maintained throughout treatment.  Arteriotomy closure was initially successful in establishing hemostasis. The patient will recover for 6 hours after the procedure.  COMPLICATIONS: None  IMPRESSION: Hepatic arterial radioembolization performed with Yttrium-90 microspheres. The left hepatic artery was treated with a prescribed dose of 19 mCi of Yttrium-90 spheres. Initial clinical follow-up will be performed in 4 weeks.   Electronically Signed   By: Aletta Edouard M.D.   On: 10/22/2013 11:29   Ir Angiogram Selective Each Additional Vessel  10/22/2013   CLINICAL DATA:  Metastatic colon cancer to the liver with residual metastatic disease in the left lobe of the liver.  EXAM: 1. ULTRASOUND GUIDANCE FOR VASCULAR ACCESS OF THE RIGHT COMMON FEMORAL ARTERY 2. HEPATIC ARTERIOGRAPHY WITH SELECTIVE CATHETERIZATION OF THE CELIAC AXIS AND CELIAC ARTERIOGRAPHY 3. SELECTIVE ARTERIOGRAPHY OF THE COMMON HEPATIC ARTERY 4. SELECTIVE ARTERIOGRAPHY OF THE LEFT HEPATIC ARTERY 5. TRANSCATHETER YTTRIUM-90 RADIOEMBOLIZATION OF THE LEFT HEPATIC ARTERY TO TREAT METASTATIC COLON CARCINOMA TO THE LIVER 6. FOLLOW-UP ANGIOGRAPHY AFTER LEFT HEPATIC RADIOEMBOLIZATION  FLUOROSCOPY TIME:  13 minutes  MEDICATIONS AND MEDICAL HISTORY: 2.0 mg IV Versed; 100 mcg IV Fentanyl.  Additional Medications: 50 mg IV Benadryl, 3.375 grams IV Zosyn, 20 mg IV Decadron, 40 mg IV Protonix, 4 mg IV Zofran, 30 mg IV Toradol   Y-90 dose: 19 mCi  ANESTHESIA/SEDATION: Moderate sedation time: 60 minutes  CONTRAST:  45 mL Visipaque 320 for arteriography and 17 mL Omnipaque 300 during radioembolization  PROCEDURE: The procedure, risks, benefits, and alternatives were explained to the patient. Questions regarding the procedure were encouraged and answered. The patient understands and consents to the procedure.  The right groin was prepped with Betadine in a sterile fashion, and a sterile drape was applied covering the operative field. A sterile gown and sterile gloves were used for the procedure. Local anesthesia was provided with 1% Lidocaine. Ultrasound image documentation was performed. Ultrasound was used to confirm patency of the right common femoral artery.  Access of the right common femoral artery was performed under ultrasound guidance with a micropuncture set. A 5-French sheath was placed. A 5-French Chung catheter was advanced and used to selectively catheterize the celiac axis. Selective arteriography was performed. A coaxial microcatheter was then advanced into the common hepatic artery and selective arteriography performed.  The microcatheter was further advanced into the left hepatic artery. Selective arteriography was performed. Radioembolization was performed with Yttrium-90 SIR Spheres. Particles were administered via a microcatheter utilizing a completely enclosed system. Monitoring of antegrade flow was performed during administration under fluoroscopy with use of contrast intermittently. After administration of the first dose of particles, the microcatheter was removed and discarded along with the attached tubing and particle vial.  Femoral puncture site was assessed with oblique arteriography. Arteriotomy closure was performed with the Cordis ExoSeal device.  FINDINGS: Initial arteriography shows durable occlusion of the gastroduodenal artery after prior coil embolization. The left hepatic artery shows stable patency.  Radioembolization was performed of the left hepatic artery with antegrade flow maintained throughout treatment.  Arteriotomy closure was initially successful in establishing hemostasis. The patient will recover for 6 hours after the procedure.  COMPLICATIONS: None  IMPRESSION: Hepatic arterial radioembolization performed with Yttrium-90 microspheres. The left hepatic artery was treated with a prescribed dose of 19 mCi of Yttrium-90 spheres. Initial clinical follow-up will be performed in 4 weeks.   Electronically Signed   By: Aletta Edouard M.D.   On: 10/22/2013 11:29   Ir Angiogram Selective Each Additional Vessel  10/10/2013   CLINICAL DATA:  Metastatic colon carcinoma to the liver. The patient has been evaluated is a candidate for Yttrium-90 radioembolization of the liver to treat residual metastatic disease. Mapping arteriography with possible gastric/ duodenum supply embolization and shunt calculation is performed prior to radioembolization. Due to chronic kidney disease and chronic renal insufficiency, the patient was admitted approximately 24 hr prior to the procedure and was hydrated overnight with saline.  EXAM: 1. ULTRASOUND GUIDANCE FOR VASCULAR ACCESS OF THE RIGHT COMMON FEMORAL ARTERY 2. VISCERAL ARTERIOGRAPHY OF THE CELIAC AXIS 3. ADDITIONAL SELECTIVE ARTERIOGRAPHY OF THE COMMON HEPATIC ARTERY, GASTRODUODENAL ARTERY, PROPER HEPATIC ARTERY AND LEFT HEPATIC ARTERY 4. TRANSCATHETER COIL EMBOLIZATION OF THE GASTRODUODENAL ARTERY 5. MAA INJECTION INTO THE LEFT HEPATIC ARTERY FOR NUCLEAR MEDICINE IMAGING AND SHUNT CALCULATION.  MEDICATIONS: Prior to the procedure the patient received 3.375 g of IV Zosyn.  4 mg IV Versed sec, 100 mcg IV fentanyl.  Total Moderate Sedation Time:  105 minutes.  CONTRAST:  90 mL Visipaque 320  FLUOROSCOPY TIME:  30 min and 24 seconds.  PROCEDURE: Ultrasound was used to confirm patency of the right common femoral artery. Under direct ultrasound guidance, access of the common  femoral artery was performed with a micropuncture set. A 5 French sheath was then placed over a guidewire.  A 5 [place] cobra catheter was advanced into the abdominal aorta. This is used to selectively catheterize the celiac axis. Attempt was made to advance the catheter over a guidewire.  A 5 [place] Sos catheter was used to selectively catheterize the celiac axis. Selective arteriography was performed through the catheter.  A 5 French Chung catheter was used to engage the celiac axis and common hepatic artery. Selective arteriography was performed at the level of the common hepatic artery.  A Renegade STC microcatheter was advanced through the [facility] catheter and further into the common hepatic artery. Selective arteriography was performed through the micro catheter. The catheter was then advanced into the gastroduodenal artery and selective arteriography performed. Embolization of the gastroduodenal artery was then performed through the micro catheter with deployment of 0.018 inch Interlock coils. A total of 8 embolization coils were deployed ranging in diameter from 2 mm to 6 mm and of varying lengths. Additional angiography was performed through the micro catheter after embolization.  A high flow micro catheter was then utilized to catheterize the proper hepatic artery and additional selective arteriography performed. The left hepatic artery was then catheterized with the micro catheter and selective arteriography performed. A dose of 4 mCi of technetium 50m MAA was then injected into the left hepatic artery. Catheter and sheath were then removed and hemostasis obtained with manual compression and use of a V pad. The patient was then transported to nuclear medicine for additional nuclear medicine imaging of the chest and abdomen with lung shunt calculation.  COMPLICATIONS: None  FINDINGS: Due to tortuous course, the celiac axis was difficult to catheterize and advance a catheter into. Ultimately, the trunk was  able to be securely accessed with a Boyd Kerbs  catheter. Common hepatic arteriography demonstrates a normally positioned gastroduodenal artery. Shortly beyond the gastroduodenal artery, a left hepatic artery is identified supplying lateral and medial segments of the left lobe.  Additional left hepatic arteriography demonstrates an enhancing lesion in the left lobe corresponding to the known residual metastatic lesion. There is a small right gastric artery emanating off of the proper hepatic artery near the branch point of the right and left hepatic arteries. This vessel was too small and tortuous to catheterize to allow embolization.  The gastroduodenal artery was completely occluded with coils. MAA injection was performed in the left hepatic artery at its first bifurcation.  IMPRESSION: Hepatic arteriography demonstrates left hepatic artery supply to metastatic disease in the liver. The gastroduodenal artery was successfully embolized with coils. MAA injection was performed in the left hepatic artery to assess distribution by nuclear medicine scintigraphy and allow lung shunt calculation. The patient will continue to receive IV hydration following the procedure for approximately 6 hr.   Electronically Signed   By: Aletta Edouard M.D.   On: 10/10/2013 15:51   Ir Angiogram Selective Each Additional Vessel  10/10/2013   CLINICAL DATA:  Metastatic colon carcinoma to the liver. The patient has been evaluated is a candidate for Yttrium-90 radioembolization of the liver to treat residual metastatic disease. Mapping arteriography with possible gastric/ duodenum supply embolization and shunt calculation is performed prior to radioembolization. Due to chronic kidney disease and chronic renal insufficiency, the patient was admitted approximately 24 hr prior to the procedure and was hydrated overnight with saline.  EXAM: 1. ULTRASOUND GUIDANCE FOR VASCULAR ACCESS OF THE RIGHT COMMON FEMORAL ARTERY 2. VISCERAL ARTERIOGRAPHY OF  THE CELIAC AXIS 3. ADDITIONAL SELECTIVE ARTERIOGRAPHY OF THE COMMON HEPATIC ARTERY, GASTRODUODENAL ARTERY, PROPER HEPATIC ARTERY AND LEFT HEPATIC ARTERY 4. TRANSCATHETER COIL EMBOLIZATION OF THE GASTRODUODENAL ARTERY 5. MAA INJECTION INTO THE LEFT HEPATIC ARTERY FOR NUCLEAR MEDICINE IMAGING AND SHUNT CALCULATION.  MEDICATIONS: Prior to the procedure the patient received 3.375 g of IV Zosyn.  4 mg IV Versed sec, 100 mcg IV fentanyl.  Total Moderate Sedation Time:  105 minutes.  CONTRAST:  90 mL Visipaque 320  FLUOROSCOPY TIME:  30 min and 24 seconds.  PROCEDURE: Ultrasound was used to confirm patency of the right common femoral artery. Under direct ultrasound guidance, access of the common femoral artery was performed with a micropuncture set. A 5 French sheath was then placed over a guidewire.  A 5 [place] cobra catheter was advanced into the abdominal aorta. This is used to selectively catheterize the celiac axis. Attempt was made to advance the catheter over a guidewire.  A 5 [place] Sos catheter was used to selectively catheterize the celiac axis. Selective arteriography was performed through the catheter.  A 5 French Chung catheter was used to engage the celiac axis and common hepatic artery. Selective arteriography was performed at the level of the common hepatic artery.  A Renegade STC microcatheter was advanced through the [facility] catheter and further into the common hepatic artery. Selective arteriography was performed through the micro catheter. The catheter was then advanced into the gastroduodenal artery and selective arteriography performed. Embolization of the gastroduodenal artery was then performed through the micro catheter with deployment of 0.018 inch Interlock coils. A total of 8 embolization coils were deployed ranging in diameter from 2 mm to 6 mm and of varying lengths. Additional angiography was performed through the micro catheter after embolization.  A high flow micro catheter was then utilized  to catheterize the  proper hepatic artery and additional selective arteriography performed. The left hepatic artery was then catheterized with the micro catheter and selective arteriography performed. A dose of 4 mCi of technetium 40m MAA was then injected into the left hepatic artery. Catheter and sheath were then removed and hemostasis obtained with manual compression and use of a V pad. The patient was then transported to nuclear medicine for additional nuclear medicine imaging of the chest and abdomen with lung shunt calculation.  COMPLICATIONS: None  FINDINGS: Due to tortuous course, the celiac axis was difficult to catheterize and advance a catheter into. Ultimately, the trunk was able to be securely accessed with a Chung catheter. Common hepatic arteriography demonstrates a normally positioned gastroduodenal artery. Shortly beyond the gastroduodenal artery, a left hepatic artery is identified supplying lateral and medial segments of the left lobe.  Additional left hepatic arteriography demonstrates an enhancing lesion in the left lobe corresponding to the known residual metastatic lesion. There is a small right gastric artery emanating off of the proper hepatic artery near the branch point of the right and left hepatic arteries. This vessel was too small and tortuous to catheterize to allow embolization.  The gastroduodenal artery was completely occluded with coils. MAA injection was performed in the left hepatic artery at its first bifurcation.  IMPRESSION: Hepatic arteriography demonstrates left hepatic artery supply to metastatic disease in the liver. The gastroduodenal artery was successfully embolized with coils. MAA injection was performed in the left hepatic artery to assess distribution by nuclear medicine scintigraphy and allow lung shunt calculation. The patient will continue to receive IV hydration following the procedure for approximately 6 hr.   Electronically Signed   By: Aletta Edouard M.D.    On: 10/10/2013 15:51   Ir Angiogram Selective Each Additional Vessel  10/10/2013   CLINICAL DATA:  Metastatic colon carcinoma to the liver. The patient has been evaluated is a candidate for Yttrium-90 radioembolization of the liver to treat residual metastatic disease. Mapping arteriography with possible gastric/ duodenum supply embolization and shunt calculation is performed prior to radioembolization. Due to chronic kidney disease and chronic renal insufficiency, the patient was admitted approximately 24 hr prior to the procedure and was hydrated overnight with saline.  EXAM: 1. ULTRASOUND GUIDANCE FOR VASCULAR ACCESS OF THE RIGHT COMMON FEMORAL ARTERY 2. VISCERAL ARTERIOGRAPHY OF THE CELIAC AXIS 3. ADDITIONAL SELECTIVE ARTERIOGRAPHY OF THE COMMON HEPATIC ARTERY, GASTRODUODENAL ARTERY, PROPER HEPATIC ARTERY AND LEFT HEPATIC ARTERY 4. TRANSCATHETER COIL EMBOLIZATION OF THE GASTRODUODENAL ARTERY 5. MAA INJECTION INTO THE LEFT HEPATIC ARTERY FOR NUCLEAR MEDICINE IMAGING AND SHUNT CALCULATION.  MEDICATIONS: Prior to the procedure the patient received 3.375 g of IV Zosyn.  4 mg IV Versed sec, 100 mcg IV fentanyl.  Total Moderate Sedation Time:  105 minutes.  CONTRAST:  90 mL Visipaque 320  FLUOROSCOPY TIME:  30 min and 24 seconds.  PROCEDURE: Ultrasound was used to confirm patency of the right common femoral artery. Under direct ultrasound guidance, access of the common femoral artery was performed with a micropuncture set. A 5 French sheath was then placed over a guidewire.  A 5 [place] cobra catheter was advanced into the abdominal aorta. This is used to selectively catheterize the celiac axis. Attempt was made to advance the catheter over a guidewire.  A 5 [place] Sos catheter was used to selectively catheterize the celiac axis. Selective arteriography was performed through the catheter.  A 5 French Chung catheter was used to engage the celiac axis and common hepatic artery. Selective  arteriography was performed at  the level of the common hepatic artery.  A Renegade STC microcatheter was advanced through the [facility] catheter and further into the common hepatic artery. Selective arteriography was performed through the micro catheter. The catheter was then advanced into the gastroduodenal artery and selective arteriography performed. Embolization of the gastroduodenal artery was then performed through the micro catheter with deployment of 0.018 inch Interlock coils. A total of 8 embolization coils were deployed ranging in diameter from 2 mm to 6 mm and of varying lengths. Additional angiography was performed through the micro catheter after embolization.  A high flow micro catheter was then utilized to catheterize the proper hepatic artery and additional selective arteriography performed. The left hepatic artery was then catheterized with the micro catheter and selective arteriography performed. A dose of 4 mCi of technetium 101m MAA was then injected into the left hepatic artery. Catheter and sheath were then removed and hemostasis obtained with manual compression and use of a V pad. The patient was then transported to nuclear medicine for additional nuclear medicine imaging of the chest and abdomen with lung shunt calculation.  COMPLICATIONS: None  FINDINGS: Due to tortuous course, the celiac axis was difficult to catheterize and advance a catheter into. Ultimately, the trunk was able to be securely accessed with a Chung catheter. Common hepatic arteriography demonstrates a normally positioned gastroduodenal artery. Shortly beyond the gastroduodenal artery, a left hepatic artery is identified supplying lateral and medial segments of the left lobe.  Additional left hepatic arteriography demonstrates an enhancing lesion in the left lobe corresponding to the known residual metastatic lesion. There is a small right gastric artery emanating off of the proper hepatic artery near the branch point of the right and left hepatic arteries.  This vessel was too small and tortuous to catheterize to allow embolization.  The gastroduodenal artery was completely occluded with coils. MAA injection was performed in the left hepatic artery at its first bifurcation.  IMPRESSION: Hepatic arteriography demonstrates left hepatic artery supply to metastatic disease in the liver. The gastroduodenal artery was successfully embolized with coils. MAA injection was performed in the left hepatic artery to assess distribution by nuclear medicine scintigraphy and allow lung shunt calculation. The patient will continue to receive IV hydration following the procedure for approximately 6 hr.   Electronically Signed   By: Aletta Edouard M.D.   On: 10/10/2013 15:51   Ir Angiogram Selective Each Additional Vessel  10/10/2013   CLINICAL DATA:  Metastatic colon carcinoma to the liver. The patient has been evaluated is a candidate for Yttrium-90 radioembolization of the liver to treat residual metastatic disease. Mapping arteriography with possible gastric/ duodenum supply embolization and shunt calculation is performed prior to radioembolization. Due to chronic kidney disease and chronic renal insufficiency, the patient was admitted approximately 24 hr prior to the procedure and was hydrated overnight with saline.  EXAM: 1. ULTRASOUND GUIDANCE FOR VASCULAR ACCESS OF THE RIGHT COMMON FEMORAL ARTERY 2. VISCERAL ARTERIOGRAPHY OF THE CELIAC AXIS 3. ADDITIONAL SELECTIVE ARTERIOGRAPHY OF THE COMMON HEPATIC ARTERY, GASTRODUODENAL ARTERY, PROPER HEPATIC ARTERY AND LEFT HEPATIC ARTERY 4. TRANSCATHETER COIL EMBOLIZATION OF THE GASTRODUODENAL ARTERY 5. MAA INJECTION INTO THE LEFT HEPATIC ARTERY FOR NUCLEAR MEDICINE IMAGING AND SHUNT CALCULATION.  MEDICATIONS: Prior to the procedure the patient received 3.375 g of IV Zosyn.  4 mg IV Versed sec, 100 mcg IV fentanyl.  Total Moderate Sedation Time:  105 minutes.  CONTRAST:  90 mL Visipaque 320  FLUOROSCOPY TIME:  30 min  and 24 seconds.   PROCEDURE: Ultrasound was used to confirm patency of the right common femoral artery. Under direct ultrasound guidance, access of the common femoral artery was performed with a micropuncture set. A 5 French sheath was then placed over a guidewire.  A 5 [place] cobra catheter was advanced into the abdominal aorta. This is used to selectively catheterize the celiac axis. Attempt was made to advance the catheter over a guidewire.  A 5 [place] Sos catheter was used to selectively catheterize the celiac axis. Selective arteriography was performed through the catheter.  A 5 French Chung catheter was used to engage the celiac axis and common hepatic artery. Selective arteriography was performed at the level of the common hepatic artery.  A Renegade STC microcatheter was advanced through the [facility] catheter and further into the common hepatic artery. Selective arteriography was performed through the micro catheter. The catheter was then advanced into the gastroduodenal artery and selective arteriography performed. Embolization of the gastroduodenal artery was then performed through the micro catheter with deployment of 0.018 inch Interlock coils. A total of 8 embolization coils were deployed ranging in diameter from 2 mm to 6 mm and of varying lengths. Additional angiography was performed through the micro catheter after embolization.  A high flow micro catheter was then utilized to catheterize the proper hepatic artery and additional selective arteriography performed. The left hepatic artery was then catheterized with the micro catheter and selective arteriography performed. A dose of 4 mCi of technetium 18m MAA was then injected into the left hepatic artery. Catheter and sheath were then removed and hemostasis obtained with manual compression and use of a V pad. The patient was then transported to nuclear medicine for additional nuclear medicine imaging of the chest and abdomen with lung shunt calculation.  COMPLICATIONS:  None  FINDINGS: Due to tortuous course, the celiac axis was difficult to catheterize and advance a catheter into. Ultimately, the trunk was able to be securely accessed with a Chung catheter. Common hepatic arteriography demonstrates a normally positioned gastroduodenal artery. Shortly beyond the gastroduodenal artery, a left hepatic artery is identified supplying lateral and medial segments of the left lobe.  Additional left hepatic arteriography demonstrates an enhancing lesion in the left lobe corresponding to the known residual metastatic lesion. There is a small right gastric artery emanating off of the proper hepatic artery near the branch point of the right and left hepatic arteries. This vessel was too small and tortuous to catheterize to allow embolization.  The gastroduodenal artery was completely occluded with coils. MAA injection was performed in the left hepatic artery at its first bifurcation.  IMPRESSION: Hepatic arteriography demonstrates left hepatic artery supply to metastatic disease in the liver. The gastroduodenal artery was successfully embolized with coils. MAA injection was performed in the left hepatic artery to assess distribution by nuclear medicine scintigraphy and allow lung shunt calculation. The patient will continue to receive IV hydration following the procedure for approximately 6 hr.   Electronically Signed   By: Aletta Edouard M.D.   On: 10/10/2013 15:51   Nm Special Med Rad Physics Cons  10/22/2013   CLINICAL DATA:  Metastatic colon carcinoma to the liver. First treatment. Left hepatic lobe only.  EXAM: NUCLEAR MEDICINE TREATMENT PROCEDURE; NUCLEAR MEDICINE RADIO PHARM THERAPY INTRA ARTERIAL; NUCLEAR MEDICINE SPECIAL MED RAD PHYSICS CONS  TECHNIQUE: In conjunction with the interventional radiologist a Y- Microsphere dose was calculated utilizing body surface area formulation. Calculated dose equal 18.4 mCi. Pre therapy MAA liver SPECT  scan and CTA were evaluated. Utilizing a  microcatheter system, the hepatic artery was selected and Y-90 microspheres were delivered in fractionated aliquots. Radiopharmaceutical was delivered by the interventional radiologist and nuclear radiologist.  The patient tolerated procedure well. No adverse effects were noted. Bremsstrahlung scan to follow.  RADIOPHARMACEUTICALS:  15.2 mCi Y 90 administered activity.  COMPARISON:  CT abdomen and pelvis from 08/16/13.  FINDINGS: Y - 90 microspheres therapy as above. First therapy the left hepatic lobe.  IMPRESSION: Successful Y - 90 microsphere delivery for treatment of unresectable liver metastasis. First therapy to the left hepatic lobe.   Electronically Signed   By: Kerby Moors M.D.   On: 10/22/2013 11:59   Nm Special Treatment Procedure  10/22/2013   CLINICAL DATA:  Metastatic colon carcinoma to the liver. First treatment. Left hepatic lobe only.  EXAM: NUCLEAR MEDICINE TREATMENT PROCEDURE; NUCLEAR MEDICINE RADIO PHARM THERAPY INTRA ARTERIAL; NUCLEAR MEDICINE SPECIAL MED RAD PHYSICS CONS  TECHNIQUE: In conjunction with the interventional radiologist a Y- Microsphere dose was calculated utilizing body surface area formulation. Calculated dose equal 18.4 mCi. Pre therapy MAA liver SPECT scan and CTA were evaluated. Utilizing a microcatheter system, the hepatic artery was selected and Y-90 microspheres were delivered in fractionated aliquots. Radiopharmaceutical was delivered by the interventional radiologist and nuclear radiologist.  The patient tolerated procedure well. No adverse effects were noted. Bremsstrahlung scan to follow.  RADIOPHARMACEUTICALS:  15.2 mCi Y 90 administered activity.  COMPARISON:  CT abdomen and pelvis from 08/16/13.  FINDINGS: Y - 90 microspheres therapy as above. First therapy the left hepatic lobe.  IMPRESSION: Successful Y - 90 microsphere delivery for treatment of unresectable liver metastasis. First therapy to the left hepatic lobe.   Electronically Signed   By: Kerby Moors  M.D.   On: 10/22/2013 11:59   Ir US Guide Vasc Access Right  10/22/2013   CLINICAL DATA:  Metastatic colon cancer to the liver with residual metastatic disease in the left lobe of the liver.  EXAM: 1. ULTRASOUND GUIDANCE FOR VASCULAR ACCESS OF THE RIGHT COMMON FEMORAL ARTERY 2. HEPATIC ARTERIOGRAPHY WITH SELECTIVE CATHETERIZATION OF THE CELIAC AXIS AND CELIAC ARTERIOGRAPHY 3. SELECTIVE ARTERIOGRAPHY OF THE COMMON HEPATIC ARTERY 4. SELECTIVE ARTERIOGRAPHY OF THE LEFT HEPATIC ARTERY 5. TRANSCATHETER YTTRIUM-90 RADIOEMBOLIZATION OF THE LEFT HEPATIC ARTERY TO TREAT METASTATIC COLON CARCINOMA TO THE LIVER 6. FOLLOW-UP ANGIOGRAPHY AFTER LEFT HEPATIC RADIOEMBOLIZATION  FLUOROSCOPY TIME:  13 minutes  MEDICATIONS AND MEDICAL HISTORY: 2.0 mg IV Versed; 100 mcg IV Fentanyl.  Additional Medications: 50 mg IV Benadryl, 3.375 grams IV Zosyn, 20 mg IV Decadron, 40 mg IV Protonix, 4 mg IV Zofran, 30 mg IV Toradol  Y-90 dose: 19 mCi  ANESTHESIA/SEDATION: Moderate sedation time: 60 minutes  CONTRAST:  45 mL Visipaque 320 for arteriography and 17 mL Omnipaque 300 during radioembolization  PROCEDURE: The procedure, risks, benefits, and alternatives were explained to the patient. Questions regarding the procedure were encouraged and answered. The patient understands and consents to the procedure.  The right groin was prepped with Betadine in a sterile fashion, and a sterile drape was applied covering the operative field. A sterile gown and sterile gloves were used for the procedure. Local anesthesia was provided with 1% Lidocaine. Ultrasound image documentation was performed. Ultrasound was used to confirm patency of the right common femoral artery.  Access of the right common femoral artery was performed under ultrasound guidance with a micropuncture set. A 5-French sheath was placed. A 5-French Chung catheter was advanced and used to  selectively catheterize the celiac axis. Selective arteriography was performed. A coaxial  microcatheter was then advanced into the common hepatic artery and selective arteriography performed.  The microcatheter was further advanced into the left hepatic artery. Selective arteriography was performed. Radioembolization was performed with Yttrium-90 SIR Spheres. Particles were administered via a microcatheter utilizing a completely enclosed system. Monitoring of antegrade flow was performed during administration under fluoroscopy with use of contrast intermittently. After administration of the first dose of particles, the microcatheter was removed and discarded along with the attached tubing and particle vial.  Femoral puncture site was assessed with oblique arteriography. Arteriotomy closure was performed with the Cordis ExoSeal device.  FINDINGS: Initial arteriography shows durable occlusion of the gastroduodenal artery after prior coil embolization. The left hepatic artery shows stable patency. Radioembolization was performed of the left hepatic artery with antegrade flow maintained throughout treatment.  Arteriotomy closure was initially successful in establishing hemostasis. The patient will recover for 6 hours after the procedure.  COMPLICATIONS: None  IMPRESSION: Hepatic arterial radioembolization performed with Yttrium-90 microspheres. The left hepatic artery was treated with a prescribed dose of 19 mCi of Yttrium-90 spheres. Initial clinical follow-up will be performed in 4 weeks.   Electronically Signed   By: Aletta Edouard M.D.   On: 10/22/2013 11:29   Ir US Guide Vasc Access Right  10/10/2013   CLINICAL DATA:  Metastatic colon carcinoma to the liver. The patient has been evaluated is a candidate for Yttrium-90 radioembolization of the liver to treat residual metastatic disease. Mapping arteriography with possible gastric/ duodenum supply embolization and shunt calculation is performed prior to radioembolization. Due to chronic kidney disease and chronic renal insufficiency, the patient was  admitted approximately 24 hr prior to the procedure and was hydrated overnight with saline.  EXAM: 1. ULTRASOUND GUIDANCE FOR VASCULAR ACCESS OF THE RIGHT COMMON FEMORAL ARTERY 2. VISCERAL ARTERIOGRAPHY OF THE CELIAC AXIS 3. ADDITIONAL SELECTIVE ARTERIOGRAPHY OF THE COMMON HEPATIC ARTERY, GASTRODUODENAL ARTERY, PROPER HEPATIC ARTERY AND LEFT HEPATIC ARTERY 4. TRANSCATHETER COIL EMBOLIZATION OF THE GASTRODUODENAL ARTERY 5. MAA INJECTION INTO THE LEFT HEPATIC ARTERY FOR NUCLEAR MEDICINE IMAGING AND SHUNT CALCULATION.  MEDICATIONS: Prior to the procedure the patient received 3.375 g of IV Zosyn.  4 mg IV Versed sec, 100 mcg IV fentanyl.  Total Moderate Sedation Time:  105 minutes.  CONTRAST:  90 mL Visipaque 320  FLUOROSCOPY TIME:  30 min and 24 seconds.  PROCEDURE: Ultrasound was used to confirm patency of the right common femoral artery. Under direct ultrasound guidance, access of the common femoral artery was performed with a micropuncture set. A 5 French sheath was then placed over a guidewire.  A 5 [place] cobra catheter was advanced into the abdominal aorta. This is used to selectively catheterize the celiac axis. Attempt was made to advance the catheter over a guidewire.  A 5 [place] Sos catheter was used to selectively catheterize the celiac axis. Selective arteriography was performed through the catheter.  A 5 French Chung catheter was used to engage the celiac axis and common hepatic artery. Selective arteriography was performed at the level of the common hepatic artery.  A Renegade STC microcatheter was advanced through the [facility] catheter and further into the common hepatic artery. Selective arteriography was performed through the micro catheter. The catheter was then advanced into the gastroduodenal artery and selective arteriography performed. Embolization of the gastroduodenal artery was then performed through the micro catheter with deployment of 0.018 inch Interlock coils. A total of 8 embolization coils  were deployed ranging in diameter from 2 mm to 6 mm and of varying lengths. Additional angiography was performed through the micro catheter after embolization.  A high flow micro catheter was then utilized to catheterize the proper hepatic artery and additional selective arteriography performed. The left hepatic artery was then catheterized with the micro catheter and selective arteriography performed. A dose of 4 mCi of technetium 37m MAA was then injected into the left hepatic artery. Catheter and sheath were then removed and hemostasis obtained with manual compression and use of a V pad. The patient was then transported to nuclear medicine for additional nuclear medicine imaging of the chest and abdomen with lung shunt calculation.  COMPLICATIONS: None  FINDINGS: Due to tortuous course, the celiac axis was difficult to catheterize and advance a catheter into. Ultimately, the trunk was able to be securely accessed with a Chung catheter. Common hepatic arteriography demonstrates a normally positioned gastroduodenal artery. Shortly beyond the gastroduodenal artery, a left hepatic artery is identified supplying lateral and medial segments of the left lobe.  Additional left hepatic arteriography demonstrates an enhancing lesion in the left lobe corresponding to the known residual metastatic lesion. There is a small right gastric artery emanating off of the proper hepatic artery near the branch point of the right and left hepatic arteries. This vessel was too small and tortuous to catheterize to allow embolization.  The gastroduodenal artery was completely occluded with coils. MAA injection was performed in the left hepatic artery at its first bifurcation.  IMPRESSION: Hepatic arteriography demonstrates left hepatic artery supply to metastatic disease in the liver. The gastroduodenal artery was successfully embolized with coils. MAA injection was performed in the left hepatic artery to assess distribution by nuclear  medicine scintigraphy and allow lung shunt calculation. The patient will continue to receive IV hydration following the procedure for approximately 6 hr.   Electronically Signed   By: Aletta Edouard M.D.   On: 10/10/2013 15:51   Ir Embo Arterial Not Garner  10/10/2013   CLINICAL DATA:  Metastatic colon carcinoma to the liver. The patient has been evaluated is a candidate for Yttrium-90 radioembolization of the liver to treat residual metastatic disease. Mapping arteriography with possible gastric/ duodenum supply embolization and shunt calculation is performed prior to radioembolization. Due to chronic kidney disease and chronic renal insufficiency, the patient was admitted approximately 24 hr prior to the procedure and was hydrated overnight with saline.  EXAM: 1. ULTRASOUND GUIDANCE FOR VASCULAR ACCESS OF THE RIGHT COMMON FEMORAL ARTERY 2. VISCERAL ARTERIOGRAPHY OF THE CELIAC AXIS 3. ADDITIONAL SELECTIVE ARTERIOGRAPHY OF THE COMMON HEPATIC ARTERY, GASTRODUODENAL ARTERY, PROPER HEPATIC ARTERY AND LEFT HEPATIC ARTERY 4. TRANSCATHETER COIL EMBOLIZATION OF THE GASTRODUODENAL ARTERY 5. MAA INJECTION INTO THE LEFT HEPATIC ARTERY FOR NUCLEAR MEDICINE IMAGING AND SHUNT CALCULATION.  MEDICATIONS: Prior to the procedure the patient received 3.375 g of IV Zosyn.  4 mg IV Versed sec, 100 mcg IV fentanyl.  Total Moderate Sedation Time:  105 minutes.  CONTRAST:  90 mL Visipaque 320  FLUOROSCOPY TIME:  30 min and 24 seconds.  PROCEDURE: Ultrasound was used to confirm patency of the right common femoral artery. Under direct ultrasound guidance, access of the common femoral artery was performed with a micropuncture set. A 5 French sheath was then placed over a guidewire.  A 5 [place] cobra catheter was advanced into the abdominal aorta. This is used to selectively catheterize the celiac axis. Attempt was made to advance the catheter over a  guidewire.  A 5 [place] Sos catheter was used to selectively  catheterize the celiac axis. Selective arteriography was performed through the catheter.  A 5 French Chung catheter was used to engage the celiac axis and common hepatic artery. Selective arteriography was performed at the level of the common hepatic artery.  A Renegade STC microcatheter was advanced through the [facility] catheter and further into the common hepatic artery. Selective arteriography was performed through the micro catheter. The catheter was then advanced into the gastroduodenal artery and selective arteriography performed. Embolization of the gastroduodenal artery was then performed through the micro catheter with deployment of 0.018 inch Interlock coils. A total of 8 embolization coils were deployed ranging in diameter from 2 mm to 6 mm and of varying lengths. Additional angiography was performed through the micro catheter after embolization.  A high flow micro catheter was then utilized to catheterize the proper hepatic artery and additional selective arteriography performed. The left hepatic artery was then catheterized with the micro catheter and selective arteriography performed. A dose of 4 mCi of technetium 75m MAA was then injected into the left hepatic artery. Catheter and sheath were then removed and hemostasis obtained with manual compression and use of a V pad. The patient was then transported to nuclear medicine for additional nuclear medicine imaging of the chest and abdomen with lung shunt calculation.  COMPLICATIONS: None  FINDINGS: Due to tortuous course, the celiac axis was difficult to catheterize and advance a catheter into. Ultimately, the trunk was able to be securely accessed with a Chung catheter. Common hepatic arteriography demonstrates a normally positioned gastroduodenal artery. Shortly beyond the gastroduodenal artery, a left hepatic artery is identified supplying lateral and medial segments of the left lobe.  Additional left hepatic arteriography demonstrates an enhancing  lesion in the left lobe corresponding to the known residual metastatic lesion. There is a small right gastric artery emanating off of the proper hepatic artery near the branch point of the right and left hepatic arteries. This vessel was too small and tortuous to catheterize to allow embolization.  The gastroduodenal artery was completely occluded with coils. MAA injection was performed in the left hepatic artery at its first bifurcation.  IMPRESSION: Hepatic arteriography demonstrates left hepatic artery supply to metastatic disease in the liver. The gastroduodenal artery was successfully embolized with coils. MAA injection was performed in the left hepatic artery to assess distribution by nuclear medicine scintigraphy and allow lung shunt calculation. The patient will continue to receive IV hydration following the procedure for approximately 6 hr.   Electronically Signed   By: Aletta Edouard M.D.   On: 10/10/2013 15:51   Ir Embo Tumor Organ Ischemia Infarct Inc Guide Roadmapping  10/22/2013   CLINICAL DATA:  Metastatic colon cancer to the liver with residual metastatic disease in the left lobe of the liver.  EXAM: 1. ULTRASOUND GUIDANCE FOR VASCULAR ACCESS OF THE RIGHT COMMON FEMORAL ARTERY 2. HEPATIC ARTERIOGRAPHY WITH SELECTIVE CATHETERIZATION OF THE CELIAC AXIS AND CELIAC ARTERIOGRAPHY 3. SELECTIVE ARTERIOGRAPHY OF THE COMMON HEPATIC ARTERY 4. SELECTIVE ARTERIOGRAPHY OF THE LEFT HEPATIC ARTERY 5. TRANSCATHETER YTTRIUM-90 RADIOEMBOLIZATION OF THE LEFT HEPATIC ARTERY TO TREAT METASTATIC COLON CARCINOMA TO THE LIVER 6. FOLLOW-UP ANGIOGRAPHY AFTER LEFT HEPATIC RADIOEMBOLIZATION  FLUOROSCOPY TIME:  13 minutes  MEDICATIONS AND MEDICAL HISTORY: 2.0 mg IV Versed; 100 mcg IV Fentanyl.  Additional Medications: 50 mg IV Benadryl, 3.375 grams IV Zosyn, 20 mg IV Decadron, 40 mg IV Protonix, 4 mg IV Zofran, 30 mg IV Toradol  Y-90 dose: 19 mCi  ANESTHESIA/SEDATION: Moderate sedation time: 60 minutes  CONTRAST:  45 mL  Visipaque 320 for arteriography and 17 mL Omnipaque 300 during radioembolization  PROCEDURE: The procedure, risks, benefits, and alternatives were explained to the patient. Questions regarding the procedure were encouraged and answered. The patient understands and consents to the procedure.  The right groin was prepped with Betadine in a sterile fashion, and a sterile drape was applied covering the operative field. A sterile gown and sterile gloves were used for the procedure. Local anesthesia was provided with 1% Lidocaine. Ultrasound image documentation was performed. Ultrasound was used to confirm patency of the right common femoral artery.  Access of the right common femoral artery was performed under ultrasound guidance with a micropuncture set. A 5-French sheath was placed. A 5-French Chung catheter was advanced and used to selectively catheterize the celiac axis. Selective arteriography was performed. A coaxial microcatheter was then advanced into the common hepatic artery and selective arteriography performed.  The microcatheter was further advanced into the left hepatic artery. Selective arteriography was performed. Radioembolization was performed with Yttrium-90 SIR Spheres. Particles were administered via a microcatheter utilizing a completely enclosed system. Monitoring of antegrade flow was performed during administration under fluoroscopy with use of contrast intermittently. After administration of the first dose of particles, the microcatheter was removed and discarded along with the attached tubing and particle vial.  Femoral puncture site was assessed with oblique arteriography. Arteriotomy closure was performed with the Cordis ExoSeal device.  FINDINGS: Initial arteriography shows durable occlusion of the gastroduodenal artery after prior coil embolization. The left hepatic artery shows stable patency. Radioembolization was performed of the left hepatic artery with antegrade flow maintained  throughout treatment.  Arteriotomy closure was initially successful in establishing hemostasis. The patient will recover for 6 hours after the procedure.  COMPLICATIONS: None  IMPRESSION: Hepatic arterial radioembolization performed with Yttrium-90 microspheres. The left hepatic artery was treated with a prescribed dose of 19 mCi of Yttrium-90 spheres. Initial clinical follow-up will be performed in 4 weeks.   Electronically Signed   By: Aletta Edouard M.D.   On: 10/22/2013 11:29   Nm Radio Pharm Therapy Intraarterial  10/22/2013   CLINICAL DATA:  Metastatic colon carcinoma to the liver. First treatment. Left hepatic lobe only.  EXAM: NUCLEAR MEDICINE TREATMENT PROCEDURE; NUCLEAR MEDICINE RADIO PHARM THERAPY INTRA ARTERIAL; NUCLEAR MEDICINE SPECIAL MED RAD PHYSICS CONS  TECHNIQUE: In conjunction with the interventional radiologist a Y- Microsphere dose was calculated utilizing body surface area formulation. Calculated dose equal 18.4 mCi. Pre therapy MAA liver SPECT scan and CTA were evaluated. Utilizing a microcatheter system, the hepatic artery was selected and Y-90 microspheres were delivered in fractionated aliquots. Radiopharmaceutical was delivered by the interventional radiologist and nuclear radiologist.  The patient tolerated procedure well. No adverse effects were noted. Bremsstrahlung scan to follow.  RADIOPHARMACEUTICALS:  15.2 mCi Y 90 administered activity.  COMPARISON:  CT abdomen and pelvis from 08/16/13.  FINDINGS: Y - 90 microspheres therapy as above. First therapy the left hepatic lobe.  IMPRESSION: Successful Y - 90 microsphere delivery for treatment of unresectable liver metastasis. First therapy to the left hepatic lobe.   Electronically Signed   By: Kerby Moors M.D.   On: 10/22/2013 11:59   Nm Fusion  10/10/2013   CLINICAL DATA:  Colorectal carcinoma of with hepatic metastasis. Left hepatic lobe injection.  EXAM: NUCLEAR MEDICINE LIVER SCAN; post processing 3D fusion.  TECHNIQUE:  Abdominal images were obtained in multiple projections after  intrahepatic arterial injection of radiopharmaceutical. SPECT imaging was performed. Lung shunt calculation was performed. Processing 3D fusion was performed.  RADIOPHARMACEUTICALS:  4MILLI CURIE MAA TECHNETIUM TO 66M ALBUMIN AGGREGATED  COMPARISON:  CT 08/06/2013  FINDINGS: The injected microaggregated albumin localizes primarily within the left hepatic lobe. No evidence of activity within the stomach, duodenum, or bowel.  Calculated shunt fraction to the lungs equals 6.4%.  IMPRESSION: 1. No significant extrahepatic radiotracer activity following intrahepatic arterial injection of MAA. 2. Lung shunt fraction equals 6.4%   Electronically Signed   By: Suzy Bouchard M.D.   On: 10/10/2013 14:05    Discharge Exam: Blood pressure 133/58, pulse 65, temperature 97.3 F (36.3 C), temperature source Oral, resp. rate 16, height 5\' 4"  (1.626 m), weight 180 lb 5.4 oz (81.8 kg), SpO2 97.00%. Patient is awake, alert and oriented. Chest is clear to auscultation bilaterally. Heart with regular rate and rhythm. Abdomen soft, obese, positive bowel sounds, nontender. Puncture site right common femoral artery clean, dry, nontender, soft, no definite hematoma. Extremities with full range of motion and no significant edema, intact PT pulses.  Disposition:home  Discharge Instructions   Call MD for:  difficulty breathing, headache or visual disturbances    Complete by:  As directed      Call MD for:  extreme fatigue    Complete by:  As directed      Call MD for:  hives    Complete by:  As directed      Call MD for:  persistant dizziness or light-headedness    Complete by:  As directed      Call MD for:  persistant nausea and vomiting    Complete by:  As directed      Call MD for:  redness, tenderness, or signs of infection (pain, swelling, redness, odor or green/yellow discharge around incision site)    Complete by:  As directed      Call MD for:   severe uncontrolled pain    Complete by:  As directed      Call MD for:  temperature >100.4    Complete by:  As directed      Change dressing (specify)    Complete by:  As directed   [place] ON 7/23 AND KEEP ON  FOR NEXT 1-2 DAYS; MAY [facility] SITE WITH SOAP AND WATER     Diet - low sodium heart healthy    Complete by:  As directed      Discharge instructions    Complete by:  As directed   MAY RESUME XELODA IN 1 WEEK; XRAY WILL CALL YOU WITH FOLLOW UP APPT IN IR CLINIC IN 4 WEEKS     Driving Restrictions    Complete by:  As directed   NO DRIVING FOR 24 HOURS     Increase activity slowly    Complete by:  As directed      Lifting restrictions    Complete by:  As directed   NO HEAVY LIFTING FOR 3-4 DAYS     May shower / Bathe    Complete by:  As directed      May walk up steps    Complete by:  As directed             Medication List         allopurinol 300 MG tablet  Commonly known as:  ZYLOPRIM  Take 150 mg by mouth every morning.     amLODipine 5  MG tablet  Commonly known as:  NORVASC  Take 5 mg by mouth every morning.     Biotin 5000 MCG Caps  Take 5,000 mcg by mouth every morning.     capecitabine 500 MG tablet  Commonly known as:  XELODA  Take 1,000 mg/m2 by mouth See admin instructions. Take 2 tablets twice daily for 14 days then off for 7 days and restart     CoQ-10 200 MG Caps  Take 200 mg by mouth at bedtime.     ferrous sulfate 325 (65 FE) MG tablet  Take 325 mg by mouth daily with breakfast.     furosemide 20 MG tablet  Commonly known as:  LASIX  Take 20 mg by mouth every morning.     irbesartan 300 MG tablet  Commonly known as:  AVAPRO  Take 300 mg by mouth every morning.     levothyroxine 75 MCG tablet  Commonly known as:  SYNTHROID, LEVOTHROID  Take 75 mcg by mouth every morning.     lidocaine-prilocaine cream  Commonly known as:  EMLA  Apply 1 application topically daily as needed (Applies to port-a-cath.).      ondansetron 8 MG tablet  Commonly known as:  ZOFRAN  Take 8 mg by mouth every 8 (eight) hours as needed for nausea or vomiting.     rosuvastatin 10 MG tablet  Commonly known as:  CRESTOR  Take 10 mg by mouth every evening.           Follow-up Information   Follow up with [facility] T, MD. (XRAY WILL CALL YOU WITH FOLLOW UP APPT IN 4 WEEKS; CALL [phone] OR [phone] WITH ANY QUESTIONS)    Specialty:  Interventional Radiology   Contact information:   [address] [place] [address] [phone]       Follow up with [facility], MD. (FOLLOW UP WITH DR. SHADAD AS SCHEDULED)    Specialty:  Oncology   Contact information:   [address] [address] [phone]       Follow up with Estanislado Emms, MD. (FOLLOW UP WITH DR. Florene Glen AS SCHEDULED FOR EVALUATION OF KIDNEY FUNCTION)    Specialty:  Nephrology   Contact information:   [place] [address] [phone]       Signed: Autumn Messing 10/22/2013, 3:11 PM

## 2013-10-22 NOTE — Procedures (Signed)
Procedure:  Celiac and hepatic arteriography with Y-90 SIR sphere radioembolization of liver Access:  Right CFA, 5 Fr sheath Findings:  GDA occluded after prior embolization. Left hepatic artery supply treated with 19 mCi dose of Y-90 SIR spheres.   Antegrade flow maintained during treatment. For NM imaging now. 6 hour recovery with additional IV hydration.

## 2013-10-22 NOTE — Progress Notes (Signed)
UR completed 

## 2013-10-22 NOTE — Discharge Instructions (Signed)
° °

## 2013-10-22 NOTE — H&P (Signed)
Agree.  Patient seen and examined.  For Y-90 radioembolization of left lobe of liver today.  Hydrated overnight.

## 2013-10-23 ENCOUNTER — Other Ambulatory Visit: Payer: Self-pay | Admitting: Radiology

## 2013-10-23 DIAGNOSIS — C20 Malignant neoplasm of rectum: Secondary | ICD-10-CM

## 2013-10-23 DIAGNOSIS — C787 Secondary malignant neoplasm of liver and intrahepatic bile duct: Principal | ICD-10-CM

## 2013-10-23 NOTE — Discharge Summary (Signed)
Agree.  Patient tolerated treatment very well.  4 week follow up post Y-90 radioembolization.

## 2013-10-24 ENCOUNTER — Other Ambulatory Visit: Payer: Self-pay | Admitting: Radiology

## 2013-10-24 ENCOUNTER — Telehealth: Payer: Self-pay | Admitting: Radiology

## 2013-10-24 DIAGNOSIS — C649 Malignant neoplasm of unspecified kidney, except renal pelvis: Secondary | ICD-10-CM

## 2013-10-24 DIAGNOSIS — C787 Secondary malignant neoplasm of liver and intrahepatic bile duct: Principal | ICD-10-CM

## 2013-10-24 NOTE — Telephone Encounter (Signed)
RECEIVED A FAX FROM [facility] CONCERNING A CONFIRMATION OF PRESCRIPTION SHIPMENT FOR CAPECITABINE ON 10/23/13.

## 2013-10-24 NOTE — Telephone Encounter (Signed)
Patient states that she is doing well 2 day post Y-90 SIRT.  Denies problems at present.  4 week follow up appointment with Dr Kathlene Cote scheduled for 11/19/2013.  CMET on or after 11/12/2013 at [facility].  Shoichi Mielke Riki Rusk, RN 10/24/2013 3:02 PM

## 2013-10-30 ENCOUNTER — Other Ambulatory Visit (HOSPITAL_BASED_OUTPATIENT_CLINIC_OR_DEPARTMENT_OTHER): Payer: Medicare Other

## 2013-10-30 ENCOUNTER — Telehealth: Payer: Self-pay | Admitting: Oncology

## 2013-10-30 ENCOUNTER — Ambulatory Visit (HOSPITAL_BASED_OUTPATIENT_CLINIC_OR_DEPARTMENT_OTHER): Payer: Medicare Other | Admitting: Oncology

## 2013-10-30 ENCOUNTER — Encounter: Payer: Self-pay | Admitting: Oncology

## 2013-10-30 VITALS — BP 131/70 | HR 72 | Temp 98.9°F | Resp 20 | Ht 64.0 in | Wt 175.2 lb

## 2013-10-30 DIAGNOSIS — D649 Anemia, unspecified: Secondary | ICD-10-CM

## 2013-10-30 DIAGNOSIS — C20 Malignant neoplasm of rectum: Secondary | ICD-10-CM

## 2013-10-30 DIAGNOSIS — C787 Secondary malignant neoplasm of liver and intrahepatic bile duct: Principal | ICD-10-CM

## 2013-10-30 DIAGNOSIS — I10 Essential (primary) hypertension: Secondary | ICD-10-CM

## 2013-10-30 LAB — CBC WITH DIFFERENTIAL/PLATELET
BASO%: 0.3 % (ref 0.0–2.0)
BASOS ABS: 0 10*3/uL (ref 0.0–0.1)
EOS%: 4.6 % (ref 0.0–7.0)
Eosinophils Absolute: 0.3 10*3/uL (ref 0.0–0.5)
HCT: 31.5 % — ABNORMAL LOW (ref 34.8–46.6)
HEMOGLOBIN: 10.4 g/dL — AB (ref 11.6–15.9)
LYMPH%: 6.8 % — AB (ref 14.0–49.7)
MCH: 34.2 pg — ABNORMAL HIGH (ref 25.1–34.0)
MCHC: 33.1 g/dL (ref 31.5–36.0)
MCV: 103.6 fL — AB (ref 79.5–101.0)
MONO#: 0.6 10*3/uL (ref 0.1–0.9)
MONO%: 10.4 % (ref 0.0–14.0)
NEUT%: 77.9 % — ABNORMAL HIGH (ref 38.4–76.8)
NEUTROS ABS: 4.7 10*3/uL (ref 1.5–6.5)
PLATELETS: 275 10*3/uL (ref 145–400)
RBC: 3.04 10*6/uL — ABNORMAL LOW (ref 3.70–5.45)
RDW: 16.9 % — AB (ref 11.2–14.5)
WBC: 6.1 10*3/uL (ref 3.9–10.3)
lymph#: 0.4 10*3/uL — ABNORMAL LOW (ref 0.9–3.3)

## 2013-10-30 LAB — COMPREHENSIVE METABOLIC PANEL (CC13)
ALK PHOS: 51 U/L (ref 40–150)
ALT: 10 U/L (ref 0–55)
AST: 21 U/L (ref 5–34)
Albumin: 3.3 g/dL — ABNORMAL LOW (ref 3.5–5.0)
Anion Gap: 8 mEq/L (ref 3–11)
BUN: 28.5 mg/dL — ABNORMAL HIGH (ref 7.0–26.0)
CO2: 25 mEq/L (ref 22–29)
Calcium: 9.6 mg/dL (ref 8.4–10.4)
Chloride: 107 mEq/L (ref 98–109)
Creatinine: 2 mg/dL — ABNORMAL HIGH (ref 0.6–1.1)
Glucose: 110 mg/dl (ref 70–140)
POTASSIUM: 4.5 meq/L (ref 3.5–5.1)
Sodium: 140 mEq/L (ref 136–145)
Total Bilirubin: 0.55 mg/dL (ref 0.20–1.20)
Total Protein: 6.9 g/dL (ref 6.4–8.3)

## 2013-10-30 NOTE — Progress Notes (Signed)
Hematology and Oncology Follow Up Visit  Ashley Davenport [id number] March 11, 1931 80 y.o. 10/30/2013 10:03 AM [name], MDClark, Don Broach, MD   Principle Diagnosis: 80 year old woman with rectal cancer diagnosed  on  09/01/2006. She hadT3 N0 moderately differentiated adenocarcinoma of the rectum with evidence of LV invasion. Now she has stage IV disease with liver mets.    Prior Therapy: She is status post neoadjuvant continuous infusion 5-FU with external radiation therapy between July 10, 2006 and Sep 01, 2006.  She declined abdominoperineal resection. She went on to receive additional 3 cycles of Xeloda, but developed hand-foot syndrome and declined further therapy.  She received FOLFIRI + Avastin. First cycle given on 09/28/12 with FOLFIRI alone. Cycle 4 was delayed due to grade 2 fatigue and grade 2-3 diarrhea. She is status post 12 cycles completed in December of 2014.  She is status post liver directed therapy in the form of Y-90 SIR sphere radioembolization. This was done on 10/22/2013  Current therapy: She started maintenance chemotherapy with oral Xeloda in 05/2013. She receives 1000 mg BID 2 weeks on and 1 week off.   Interim History: Ashley Davenport presents today for a follow up visit. She tolerated radiatioembolization without any complications. She resumed the Xeloda today without any issues. Fatigue is better today since she was discharged from the hospital.  She remains independent in ADLs. Appetite is improving and she is slowly gaining weight. She is reporting no hand foot syndrome without increased pain and no skin cracking or desquamation. No nausea. Vomiting, or diarrhea. No chest pain, shortness of breath, or dyspnea. She continued to have excellent quality of life and have regained most activities of daily living. She has not reported any genitourinary or gastrointestinal symptoms. She has not reported any neurological symptoms or cardiovascular symptoms. She has not  reported any chest pain difficulty breathing cough or hemoptysis or hematemesis. She has not reported any lymphadenopathy or petechiae. The rest of review of systems was unremarkable.  Medications: I have reviewed the patient's current medications.     Allergies:  Allergies  Allergen Reactions  . Shellfish Allergy Itching and Rash    Feels itching internally.  . Tramadol Hcl Swelling    REACTION: Tongue and lips swell  . Iodine Itching  . Meperidine Hcl Other (See Comments)    REACTION: Hypotension      Physical Exam: Blood pressure 131/70, pulse 72, temperature 98.9 F (37.2 C), temperature source Oral, resp. rate 20, height 5\' 4"  (1.626 m), weight 175 lb 3.2 oz (79.47 kg). ECOG: 1 General appearance: alert awake not in any distress. Head: Normocephalic, no lesions. Neck: no adenopathy, no thyromegaly. Lymph nodes: Cervical, supraclavicular, and axillary nodes normal. Heart:regular rate and rhythm, S1, S2 normal, no murmur, click, rub or gallop.  Lung:chest clear, no wheezing, rales, normal symmetric air entry Abdomen: soft, non-tender, without masses or organomegaly EXT:no erythema, induration, or nodules. N   Lab Results: Lab Results  Component Value Date   WBC 6.1 10/30/2013   HGB 10.4* 10/30/2013   HCT 31.5* 10/30/2013   MCV 103.6* 10/30/2013   PLT 275 10/30/2013     Chemistry      Component Value Date/Time   NA 137 10/22/2013 0510   NA 140 09/12/2013 0949   K 4.9 10/22/2013 0510   K 4.3 09/12/2013 0949   CL 105 10/22/2013 0510   CL 102 09/25/2012 0933   CO2 21 10/22/2013 0510   CO2 23 09/12/2013 0949   BUN 38*  10/22/2013 0510   BUN 28.8* 09/12/2013 0949   CREATININE 2.13* 10/22/2013 0510   CREATININE 2.3* 09/12/2013 0949      Component Value Date/Time   CALCIUM 9.2 10/22/2013 0510   CALCIUM 9.2 09/12/2013 0949   ALKPHOS 50 10/22/2013 0510   ALKPHOS 66 09/12/2013 0949   AST 16 10/22/2013 0510   AST 31 09/12/2013 0949   ALT 7 10/22/2013 0510   ALT 16 09/12/2013 0949    BILITOT 0.2* 10/22/2013 0510   BILITOT 0.48 09/12/2013 0949     Impression and Plan:  Ashley Davenport is a 80 year old woman with:  1. T3 N0 moderately differentiated adenocarcinoma of the rectum with evidence of LV invasion and positive surgical margins 05/30/2006. She has metastatic disease to the liver and status post 8 treatments mentioned above. She is currently on maintenance low and we'll continue to do so. We'll repeat imaging studies of the liver and abdomen in 2-3 months. I will not schedule these imaging studies until the next visit to make sure we are not duplicating any imaging studies ordered by interventional radiology.  2. Anemia: S/P Feraheme. Anemia is multifactorial in nature we'll continue to monitor.  3. Nausea and vomiting prophylaxis: On Zofran and compazine. This is a no longer an issue  4. HTN: She is on amlodipine and valsartan.   5. Port-A-Cath management: Her port was used during her radiation was a but will need to be flushed in about 6 weeks.  6. Followup: In  6 weeks.    SHADAD,FIRAS 7/29/201510:03 AM

## 2013-10-30 NOTE — Telephone Encounter (Signed)
Pt confirmed labs/ov per 07/29 POF, gave pt AVS...KJ °

## 2013-11-08 ENCOUNTER — Telehealth: Payer: Self-pay | Admitting: Oncology

## 2013-11-08 NOTE — Telephone Encounter (Signed)
s.w. pt and advised on appt d.t change AJ on pal....pt ok adn aware

## 2013-11-11 ENCOUNTER — Other Ambulatory Visit: Payer: Self-pay | Admitting: *Deleted

## 2013-11-11 NOTE — Telephone Encounter (Signed)
THIS REFILL REQUEST WAS PLACED IN DR.SHADAD'S ACTIVE WORK FOLDER. 

## 2013-11-12 ENCOUNTER — Other Ambulatory Visit: Payer: Self-pay | Admitting: *Deleted

## 2013-11-12 MED ORDER — CAPECITABINE 500 MG PO TABS: 1000.0000 mg/m2 | ORAL_TABLET | Freq: Two times a day (BID) | ORAL | Status: DC

## 2013-11-12 NOTE — Telephone Encounter (Signed)
RECEIVED A FAX FROM [facility] CONCERNING A CONFIRMATION OF FACSIMILE RECEIPT FOR PT. REFERRAL. 

## 2013-11-18 NOTE — Telephone Encounter (Signed)
RECEIVED A FAX FROM [facility] CONCERNING A CONFIRMATION OF PRESCRIPTION SHIPMENT FOR CAPECITABINE ON 11/15/13.

## 2013-11-19 ENCOUNTER — Ambulatory Visit
Admission: RE | Admit: 2013-11-19 | Discharge: 2013-11-19 | Disposition: A | Discharge status: Home / Self Care | Payer: Medicare Other | Source: Ambulatory Visit | Attending: Radiology | Admitting: Radiology

## 2013-11-19 ENCOUNTER — Other Ambulatory Visit (HOSPITAL_COMMUNITY): Payer: Self-pay | Admitting: Interventional Radiology

## 2013-11-19 DIAGNOSIS — C20 Malignant neoplasm of rectum: Secondary | ICD-10-CM

## 2013-11-19 DIAGNOSIS — N189 Chronic kidney disease, unspecified: Secondary | ICD-10-CM

## 2013-11-19 DIAGNOSIS — C787 Secondary malignant neoplasm of liver and intrahepatic bile duct: Principal | ICD-10-CM

## 2013-12-10 ENCOUNTER — Encounter: Payer: Self-pay | Admitting: *Deleted

## 2013-12-10 NOTE — Progress Notes (Signed)
RECEIVED A FAX FROM [facility] CONCERNING A CONFIRMATION OF PRESCRIPTION SHIPMENT FOR CAPECITABINE ON 12/06/13.

## 2013-12-16 ENCOUNTER — Other Ambulatory Visit (HOSPITAL_BASED_OUTPATIENT_CLINIC_OR_DEPARTMENT_OTHER): Payer: Medicare Other

## 2013-12-16 ENCOUNTER — Ambulatory Visit (HOSPITAL_BASED_OUTPATIENT_CLINIC_OR_DEPARTMENT_OTHER): Payer: Medicare Other | Admitting: Physician Assistant

## 2013-12-16 ENCOUNTER — Ambulatory Visit (HOSPITAL_BASED_OUTPATIENT_CLINIC_OR_DEPARTMENT_OTHER): Payer: Medicare Other

## 2013-12-16 ENCOUNTER — Encounter: Payer: Self-pay | Admitting: Physician Assistant

## 2013-12-16 ENCOUNTER — Telehealth: Payer: Self-pay | Admitting: Oncology

## 2013-12-16 VITALS — BP 135/69 | HR 65 | Temp 98.0°F | Resp 18 | Ht 64.0 in | Wt 174.4 lb

## 2013-12-16 DIAGNOSIS — C787 Secondary malignant neoplasm of liver and intrahepatic bile duct: Secondary | ICD-10-CM

## 2013-12-16 DIAGNOSIS — C20 Malignant neoplasm of rectum: Secondary | ICD-10-CM | POA: Diagnosis present

## 2013-12-16 DIAGNOSIS — I10 Essential (primary) hypertension: Secondary | ICD-10-CM

## 2013-12-16 DIAGNOSIS — Z95828 Presence of other vascular implants and grafts: Secondary | ICD-10-CM

## 2013-12-16 DIAGNOSIS — C189 Malignant neoplasm of colon, unspecified: Secondary | ICD-10-CM

## 2013-12-16 LAB — CBC WITH DIFFERENTIAL/PLATELET
BASO%: 0.2 % (ref 0.0–2.0)
Basophils Absolute: 0 10*3/uL (ref 0.0–0.1)
EOS ABS: 0.2 10*3/uL (ref 0.0–0.5)
EOS%: 3.1 % (ref 0.0–7.0)
HCT: 30.3 % — ABNORMAL LOW (ref 34.8–46.6)
HGB: 10 g/dL — ABNORMAL LOW (ref 11.6–15.9)
LYMPH#: 0.6 10*3/uL — AB (ref 0.9–3.3)
LYMPH%: 10.3 % — ABNORMAL LOW (ref 14.0–49.7)
MCH: 33.6 pg (ref 25.1–34.0)
MCHC: 33 g/dL (ref 31.5–36.0)
MCV: 101.7 fL — ABNORMAL HIGH (ref 79.5–101.0)
MONO#: 0.5 10*3/uL (ref 0.1–0.9)
MONO%: 8.4 % (ref 0.0–14.0)
NEUT#: 4.5 10*3/uL (ref 1.5–6.5)
NEUT%: 78 % — ABNORMAL HIGH (ref 38.4–76.8)
PLATELETS: 229 10*3/uL (ref 145–400)
RBC: 2.98 10*6/uL — ABNORMAL LOW (ref 3.70–5.45)
RDW: 16 % — AB (ref 11.2–14.5)
WBC: 5.7 10*3/uL (ref 3.9–10.3)

## 2013-12-16 LAB — COMPREHENSIVE METABOLIC PANEL (CC13)
ALBUMIN: 3.3 g/dL — AB (ref 3.5–5.0)
ALT: 12 U/L (ref 0–55)
AST: 24 U/L (ref 5–34)
Alkaline Phosphatase: 64 U/L (ref 40–150)
Anion Gap: 12 mEq/L — ABNORMAL HIGH (ref 3–11)
BUN: 33.3 mg/dL — ABNORMAL HIGH (ref 7.0–26.0)
CHLORIDE: 108 meq/L (ref 98–109)
CO2: 19 mEq/L — ABNORMAL LOW (ref 22–29)
Calcium: 9.2 mg/dL (ref 8.4–10.4)
Creatinine: 2 mg/dL — ABNORMAL HIGH (ref 0.6–1.1)
GLUCOSE: 136 mg/dL (ref 70–140)
POTASSIUM: 3.7 meq/L (ref 3.5–5.1)
Sodium: 139 mEq/L (ref 136–145)
Total Bilirubin: 0.41 mg/dL (ref 0.20–1.20)
Total Protein: 6.8 g/dL (ref 6.4–8.3)

## 2013-12-16 MED ORDER — SODIUM CHLORIDE 0.9 % IJ SOLN
10.0000 mL | INTRAMUSCULAR | Status: DC | PRN
  Administered 2013-12-16: 10 mL via INTRAVENOUS
  Filled 2013-12-16: qty 10

## 2013-12-16 MED ORDER — HEPARIN SOD (PORK) LOCK FLUSH 100 UNIT/ML IV SOLN
500.0000 [IU] | Freq: Once | INTRAVENOUS | Status: AC
  Administered 2013-12-16: 500 [IU] via INTRAVENOUS
  Filled 2013-12-16: qty 5

## 2013-12-16 NOTE — Patient Instructions (Signed)
Continue taking your Xeloda at the current dose Follow up in 6 weeks with repeat labs drawn from your port-a cath

## 2013-12-16 NOTE — Patient Instructions (Signed)

## 2013-12-16 NOTE — Progress Notes (Signed)
Hematology and Oncology Follow Up Visit  Ashley Davenport [id number] [birth year] 80 y.o. 12/16/2013 11:07 AM Ashley Davenport, MDClark, Don Broach, MD   Principle Diagnosis: 80 year old woman with rectal cancer diagnosed  on  09/01/2006. She hadT3 N0 moderately differentiated adenocarcinoma of the rectum with evidence of LV invasion. Now she has stage IV disease with liver mets.    Prior Therapy: She is status post neoadjuvant continuous infusion 5-FU with external radiation therapy between July 10, 2006 and Sep 01, 2006.  She declined abdominoperineal resection. She went on to receive additional 3 cycles of Xeloda, but developed hand-foot syndrome and declined further therapy.  She received FOLFIRI + Avastin. First cycle given on 09/28/12 with FOLFIRI alone. Cycle 4 was delayed due to grade 2 fatigue and grade 2-3 diarrhea. She is status post 12 cycles completed in December of 2014.  She is status post liver directed therapy in the form of Y-90 SIR sphere radioembolization. This was done on 10/22/2013  Current therapy: She started maintenance chemotherapy with oral Xeloda in 05/2013. She receives 1000 mg BID 2 weeks on and 1 week off.   Interim History: Ashley Davenport presents today for a follow up visit. She tolerated radiatioembolization without any complications. She continues on the Xeloda today without any issues. Overall she feels well. She does report some numbness and tingling in her toes that she finds annoying. It is not causing her any near falls or falls. She reports some new onset pain/soreness in the left hip without any distinct trauma. She does complain of some generalized itching particularly in the palms of both hands. This also is annoying but tolerable. She is scheduled for a restaging PET scan to followup the Y-90 procedure on 12/18/2013 with followup with Dr. Kathlene Cote shortly thereafter although the specific date has not been set. She remains independent in ADLs. Appetite is good  improving and she is slowly gaining weight. She is reporting no hand foot syndrome without increased pain and no skin cracking or desquamation. No nausea. Vomiting, or diarrhea. No chest pain, shortness of breath, or dyspnea. She continued to have excellent quality of life.  She has not reported any genitourinary or gastrointestinal symptoms. She has not reported any neurological symptoms or cardiovascular symptoms. She has not reported any chest pain difficulty breathing cough or hemoptysis or hematemesis. She has not reported any lymphadenopathy or petechiae. The remainder of review of the systems was unremarkable.  Medications: I have reviewed the patient's current medications.     Allergies:  Allergies  Allergen Reactions  . Shellfish Allergy Itching and Rash    Feels itching internally.  . Tramadol Hcl Swelling    REACTION: Tongue and lips swell  . Iodine Itching  . Meperidine Hcl Other (See Comments)    REACTION: Hypotension      Physical Exam: Blood pressure 135/69, pulse 65, temperature 98 F (36.7 C), temperature source Oral, resp. rate 18, height 5\' 4"  (1.626 m), weight 174 lb 6.4 oz (79.107 kg), SpO2 100.00%. ECOG: 1 General appearance: alert awake not in any distress. Head: Normocephalic, no lesions. Neck: no adenopathy, no thyromegaly. Lymph nodes: Cervical, supraclavicular, and axillary nodes normal. Heart:regular rate and rhythm, S1, S2 normal, no murmur, click, rub or gallop.  Lung:chest clear, no wheezing, rales, normal symmetric air entry Abdomen: soft, non-tender, without masses or organomegaly EXT:no erythema, induration, or nodules. No skin rashes or desquamation   Lab Results: Lab Results  Component Value Date   WBC 5.7 12/16/2013   HGB  10.0* 12/16/2013   HCT 30.3* 12/16/2013   MCV 101.7* 12/16/2013   PLT 229 12/16/2013     Chemistry      Component Value Date/Time   NA 139 12/16/2013 0858   NA 137 10/22/2013 0510   K 3.7 12/16/2013 0858   K 4.9 10/22/2013  0510   CL 105 10/22/2013 0510   CL 102 09/25/2012 0933   CO2 19* 12/16/2013 0858   CO2 21 10/22/2013 0510   BUN 33.3* 12/16/2013 0858   BUN 38* 10/22/2013 0510   CREATININE 2.0* 12/16/2013 0858   CREATININE 2.13* 10/22/2013 0510      Component Value Date/Time   CALCIUM 9.2 12/16/2013 0858   CALCIUM 9.2 10/22/2013 0510   ALKPHOS 64 12/16/2013 0858   ALKPHOS 50 10/22/2013 0510   AST 24 12/16/2013 0858   AST 16 10/22/2013 0510   ALT 12 12/16/2013 0858   ALT 7 10/22/2013 0510   BILITOT 0.41 12/16/2013 0858   BILITOT 0.2* 10/22/2013 0510     Impression and Plan:  Ashley Davenport is a 80 year old woman with:  1. T3 N0 moderately differentiated adenocarcinoma of the rectum with evidence of LV invasion and positive surgical margins 05/30/2006. She has metastatic disease to the liver and status post 8 treatments mentioned above. She is currently on maintenance Xeloda and she will continue on this medication at the current dose. We'll repeat imaging studies of the liver and abdomen in 2-3 months. I will not schedule these imaging studies until the next visit to make sure we are not duplicating any imaging studies ordered by interventional radiology. As stated above she has a PET scan scheduled per interventional radiology on 12/18/2013.  2. Anemia: S/P Feraheme. Anemia is multifactorial in nature we'll continue to monitor. Hemoglobin is relatively stable at 10.0 g/dL.  3. Nausea and vomiting prophylaxis: Resolved. Has Zofran and compazine if needed.   4. HTN: She is on amlodipine and valsartan.   5. Port-A-Cath management:  This will need to be flushed in about 6 weeks.  6. Followup: In  6 weeks.    Carlton Adam, PA-C 9/14/201511:07 AM

## 2013-12-16 NOTE — Telephone Encounter (Signed)
gv pt appt schedule for oct.  °

## 2013-12-17 ENCOUNTER — Ambulatory Visit: Payer: Medicare Other | Admitting: Family

## 2013-12-17 ENCOUNTER — Other Ambulatory Visit (HOSPITAL_COMMUNITY): Payer: Medicare Other

## 2013-12-17 ENCOUNTER — Ambulatory Visit: Payer: Medicare Other | Admitting: Physician Assistant

## 2013-12-17 ENCOUNTER — Other Ambulatory Visit: Payer: Medicare Other

## 2013-12-18 ENCOUNTER — Ambulatory Visit (HOSPITAL_COMMUNITY)
Admission: RE | Admit: 2013-12-18 | Discharge: 2013-12-18 | Disposition: A | Discharge status: Home / Self Care | Payer: Medicare Other | Source: Ambulatory Visit | Attending: Interventional Radiology | Admitting: Interventional Radiology

## 2013-12-18 ENCOUNTER — Encounter (HOSPITAL_COMMUNITY): Payer: Self-pay

## 2013-12-18 DIAGNOSIS — C20 Malignant neoplasm of rectum: Secondary | ICD-10-CM | POA: Diagnosis present

## 2013-12-18 DIAGNOSIS — C787 Secondary malignant neoplasm of liver and intrahepatic bile duct: Secondary | ICD-10-CM | POA: Insufficient documentation

## 2013-12-18 DIAGNOSIS — N189 Chronic kidney disease, unspecified: Secondary | ICD-10-CM | POA: Insufficient documentation

## 2013-12-18 LAB — GLUCOSE, CAPILLARY: GLUCOSE-CAPILLARY: 110 mg/dL — AB (ref 70–99)

## 2013-12-18 MED ORDER — FLUDEOXYGLUCOSE F - 18 (FDG) INJECTION: 8.6000 | Freq: Once | INTRAVENOUS | Status: AC | PRN

## 2013-12-25 ENCOUNTER — Other Ambulatory Visit (HOSPITAL_COMMUNITY): Payer: Self-pay | Admitting: Interventional Radiology

## 2013-12-25 DIAGNOSIS — C787 Secondary malignant neoplasm of liver and intrahepatic bile duct: Secondary | ICD-10-CM

## 2013-12-27 ENCOUNTER — Encounter: Payer: Self-pay | Admitting: Family

## 2013-12-30 ENCOUNTER — Encounter: Payer: Self-pay | Admitting: Family

## 2013-12-30 ENCOUNTER — Ambulatory Visit (HOSPITAL_COMMUNITY)
Admission: RE | Admit: 2013-12-30 | Discharge: 2013-12-30 | Disposition: A | Discharge status: Home / Self Care | Payer: Medicare Other | Source: Ambulatory Visit | Attending: Family | Admitting: Family

## 2013-12-30 ENCOUNTER — Ambulatory Visit (INDEPENDENT_AMBULATORY_CARE_PROVIDER_SITE_OTHER): Payer: Medicare Other | Admitting: Family

## 2013-12-30 VITALS — BP 131/79 | HR 67 | Resp 16 | Ht 64.0 in | Wt 173.0 lb

## 2013-12-30 DIAGNOSIS — Z48812 Encounter for surgical aftercare following surgery on the circulatory system: Secondary | ICD-10-CM

## 2013-12-30 DIAGNOSIS — I714 Abdominal aortic aneurysm, without rupture, unspecified: Secondary | ICD-10-CM | POA: Insufficient documentation

## 2013-12-30 NOTE — Addendum Note (Signed)
Addended by: Dorthula Rue L on: 12/30/2013 10:58 AM   Modules accepted: Orders

## 2013-12-30 NOTE — Progress Notes (Signed)
[facility]  History of Present Illness  Ashley Davenport is a 80 y.o. (1930/12/30) female patient of Dr. Kellie Simmering who returns for annual followup regarding her aortic stent graft for abdominal aortic aneurysm which Dr. Kellie Simmering placed in 2011. She's had no abdominal or back symptoms. She does have a recent diagnosis of a metastatic lesion in her liver perceivably from rectal cancer which was treated in 2008. She is currently on chemotherapy. She denies back or abdominal pain. Toes tingling for a year, she states this may be a side effect of her chemotherapy.  Pt Diabetic: No Pt smoker: former smoker, quit in 1990  Past Medical History  Diagnosis Date  . AAA (abdominal aortic aneurysm) 2011  . Hyperlipidemia   . Hypertension   . Shingles     POST HERPATIC PAIN FROM SHINGLES  . Cancer     Rectal  . History of acute gouty arthritis   . Rectal cancer   . Kidney cysts   . Anemia     HX OF ANEMIA WITH CHEMO 2008  . Hypothyroidism    Past Surgical History  Procedure Laterality Date  . Abdominal hysterectomy  1961    total  . Tonsillectomy  1949  . Bunionectomy Bilateral   . Rectal tumor removed  2008    CANCEROUS  . Abdominal aorta aneurysm repair -stent  2011  . Portacath placement N/A 09/24/2012    Procedure: INSERTION PORT-A-CATH;  Surgeon: Stark Klein, MD;  Location: [facility];  Service: General;  Laterality: N/A;  . Abdominal aortic aneurysm repair    . Hepatic/embolization arteriogram with y-90 radioembolization  10/21/13   Social History History  Substance Use Topics  . Smoking status: Former Smoker -- 1.00 packs/day for 20 years    Types: Cigarettes    Quit date: 04/04/1988  . Smokeless tobacco: Never Used  . Alcohol Use: Yes     Comment: RARE   Family History Family History  Problem Relation Age of Onset  . Hypertension Mother   . Heart disease Mother   . Heart attack Mother   . Hypertension Father   . Heart  disease Father   . Diabetes Father   . Heart attack Father   . Cancer Sister     ovarian  . Diabetes Sister   . Hyperlipidemia Sister   . Hypertension Sister   . Kidney disease Sister   . Cancer Cousin     breast  . Cancer Cousin     breast  . Diabetes Daughter   . Hyperlipidemia Daughter   . Hypertension Daughter    Current Outpatient Prescriptions on File Prior to Visit  Medication Sig Dispense Refill  . allopurinol (ZYLOPRIM) 300 MG tablet Take 150 mg by mouth every morning.      Marland Kitchen amLODipine (NORVASC) 5 MG tablet Take 5 mg by mouth every morning.       . Biotin 5000 MCG CAPS Take 5,000 mcg by mouth every morning.       . capecitabine (XELODA) 500 MG tablet Take 4 tablets (2,000 mg total) by mouth 2 (two) times daily after a meal.  56 tablet  0  . Coenzyme Q10 (COQ-10) 200 MG CAPS Take 200 mg by mouth at bedtime.       . ferrous sulfate 325 (65 FE) MG tablet Take 325 mg by mouth daily with breakfast.      . furosemide (LASIX) 20 MG tablet Take 20 mg by mouth every  morning.       . irbesartan (AVAPRO) 300 MG tablet Take 300 mg by mouth every morning.      Marland Kitchen levothyroxine (SYNTHROID, LEVOTHROID) 75 MCG tablet Take 75 mcg by mouth every morning.       . lidocaine-prilocaine (EMLA) cream Apply 1 application topically daily as needed (Applies to port-a-cath.).      Marland Kitchen ondansetron (ZOFRAN) 8 MG tablet Take 8 mg by mouth every 8 (eight) hours as needed for nausea or vomiting.      . rosuvastatin (CRESTOR) 10 MG tablet Take 10 mg by mouth every evening.       Current Facility-Administered Medications on File Prior to Visit  Medication Dose Route Frequency Provider Last Rate Last Dose  . 0.9 %  sodium chloride infusion   Intravenous Once Wyatt Portela, MD      . sodium chloride 0.9 % injection 10 mL  10 mL Intravenous PRN Maryanna Shape, NP   10 mL at 05/31/13 1122   Allergies  Allergen Reactions  . Shellfish Allergy Itching and Rash    Feels itching internally.  . Tramadol Hcl  Swelling    REACTION: Tongue and lips swell  . Iodine Itching  . Meperidine Hcl Other (See Comments)    REACTION: Hypotension     ROS: See HPI for pertinent positives and negatives.  Physical Examination  Filed Vitals:   12/30/13 1009  BP: 131/79  Pulse: 67  Resp: 16  Height: 5\' 4"  (1.626 m)  Weight: 173 lb (78.472 kg)  SpO2: 100%   Body mass index is 29.68 kg/(m^2).  General: A&O x 3, WD.  Pulmonary: Sym exp, good air movt, CTAB, no rales, rhonchi, or wheezing.   Cardiac: RRR, Nl S1, S2, no Murmurs appreciated.  Vascular: Vessel Right Left  Radial 2+Palpable 2+Palpable  Carotid palpable without bruit palpable without bruit  Aorta Not palpable N/A  Femoral 2+Palpable 2+Palpable  Popliteal Not palpable Not palpable  PT 1+Palpable 1+Palpable  DP 1+Palpable 1+Palpable   Gastrointestinal: soft, NTND, -G/R, - HSM, - masses palpated, - CVAT B.  Musculoskeletal: M/S 5/5 throughout, Extremities without ischemic changes.  Neurologic: Pain and light touch intact in extremities, Motor exam as listed above. CN 2-12 grossly intact.  Non-Invasive Vascular Imaging  [facility] Duplex (Date: 12/30/2013) ABDOMINAL AORTA DUPLEX EVALUATION - POST ENDOVASCULAR REPAIR    INDICATION: Stent repair of abdominal aortic aneurysm     PREVIOUS INTERVENTION(S): Stent repair of abdominal aortic aneurysm on 02/05/10    DUPLEX EXAM:      DIAMETER AP (cm) DIAMETER TRANSVERSE (cm) VELOCITIES (cm/sec)  Aorta 4.2 4.3 78  Right Common Iliac 1.7 1.6 50  Left Common Iliac 1.4 1.5 73    Comparison Study  Ultrasound     Date DIAMETER AP (cm) DIAMETER TRANSVERSE (cm)  12/11/12 4.25 4.25     ADDITIONAL FINDINGS:   Decreased visualization of the abdominal vasculature due to overlying bowel gas and patient body habitus.   No flow was visualized within the aneurysmal sac.    IMPRESSION: Patent stent of the abdominal aorta with a maximum diameter measurement of 4.3cm.    Compared to the previous  exam:  No significant change in the maximum diameter of the abdominal aorta when compared to the previous exam.     CTA Abd/Pelvis Duplex (Date: 08/06/13), ordered by Dr. Alen Blew, indication: Rectal cancer with liver metastases, oral  chemotherapy in progress  4.2 x 4.1 cm infrarenal abdominal aortic aneurysm with indwelling  aorto bi-iliac  stent.    Medical Decision Making  Ashley Davenport is a 80 y.o. female who presents s/p EVAR (Date: 02/05/10).  Pt is asymptomatic with stable sac size.  I discussed with the patient the importance of surveillance of the endograft.  Return in one year for duplex scan continued followup of aortic aneurysm stent graft repair  I emphasized the importance of maximal medical management including strict control of blood pressure, blood glucose, and lipid levels, antiplatelet agents, obtaining regular exercise, and cessation of smoking.   The patient was given information about AAA including signs, symptoms, treatment, and how to minimize the risk of enlargement and rupture of aneurysms.    Thank you for allowing [place] to participate in this patient's care.  Clemon Chambers, RN, MSN, FNP-C [facility] [place] Office: [facility] Physician: Trula Slade  12/30/2013, 10:20 AM

## 2013-12-30 NOTE — Patient Instructions (Signed)
Abdominal Aortic Aneurysm An aneurysm is a weakened or damaged part of an artery wall that bulges from the normal force of blood pumping through the body. An abdominal aortic aneurysm is an aneurysm that occurs in the lower part of the aorta, the main artery of the body.  The major concern with an abdominal aortic aneurysm is that it can enlarge and burst (rupture) or blood can flow between the layers of the wall of the aorta through a tear (aorticdissection). Both of these conditions can cause bleeding inside the body and can be life threatening unless diagnosed and treated promptly. CAUSES  The exact cause of an abdominal aortic aneurysm is unknown. Some contributing factors are:   A hardening of the arteries caused by the buildup of fat and other substances in the lining of a blood vessel (arteriosclerosis).  Inflammation of the walls of an artery (arteritis).   Connective tissue diseases, such as Marfan syndrome.   Abdominal trauma.   An infection, such as syphilis or staphylococcus, in the wall of the aorta (infectious aortitis) caused by bacteria. RISK FACTORS  Risk factors that contribute to an abdominal aortic aneurysm may include:  Age older than 60 years.   High blood pressure (hypertension).  Female gender.  Ethnicity (white race).  Obesity.  Family history of aneurysm (first degree relatives only).  Tobacco use. PREVENTION  The following healthy lifestyle habits may help decrease your risk of abdominal aortic aneurysm:  Quitting smoking. Smoking can raise your blood pressure and cause arteriosclerosis.  Limiting or avoiding alcohol.  Keeping your blood pressure, blood sugar level, and cholesterol levels within normal limits.  Decreasing your salt intake. In somepeople, too much salt can raise blood pressure and increase your risk of abdominal aortic aneurysm.  Eating a diet low in saturated fats and cholesterol.  Increasing your fiber intake by including  whole grains, vegetables, and fruits in your diet. Eating these foods may help lower blood pressure.  Maintaining a healthy weight.  Staying physically active and exercising regularly. SYMPTOMS  The symptoms of abdominal aortic aneurysm may vary depending on the size and rate of growth of the aneurysm.Most grow slowly and do not have any symptoms. When symptoms do occur, they may include:  Pain (abdomen, side, lower back, or groin). The pain may vary in intensity. A sudden onset of severe pain may indicate that the aneurysm has ruptured.  Feeling full after eating only small amounts of food.  Nausea or vomiting or both.  Feeling a pulsating lump in the abdomen.  Feeling faint or passing out. DIAGNOSIS  Since most unruptured abdominal aortic aneurysms have no symptoms, they are often discovered during diagnostic exams for other conditions. An aneurysm may be found during the following procedures:  Ultrasonography (A one-time screening for abdominal aortic aneurysm by ultrasonography is also recommended for all men aged 65-75 years who have ever smoked).  X-ray exams.  A computed tomography (CT).  Magnetic resonance imaging (MRI).  Angiography or arteriography. TREATMENT  Treatment of an abdominal aortic aneurysm depends on the size of your aneurysm, your age, and risk factors for rupture. Medication to control blood pressure and pain may be used to manage aneurysms smaller than 6 cm. Regular monitoring for enlargement may be recommended by your caregiver if:  The aneurysm is 3-4 cm in size (an annual ultrasonography may be recommended).  The aneurysm is 4-4.5 cm in size (an ultrasonography every 6 months may be recommended).  The aneurysm is larger than 4.5 cm in   size (your caregiver may ask that you be examined by a vascular surgeon). If your aneurysm is larger than 6 cm, surgical repair may be recommended. There are two main methods for repair of an aneurysm:   Endovascular  repair (a minimally invasive surgery). This is done most often.  Open repair. This method is used if an endovascular repair is not possible. Document Released: 12/29/2004 Document Revised: 07/16/2012 Document Reviewed: 04/20/2012 Exit[employer] Patient Information 2015 [employer]. This information is not intended to replace advice given to you by your health care provider. Make sure you discuss any questions you have with your health care provider.  

## 2014-01-03 ENCOUNTER — Telehealth: Payer: Self-pay | Admitting: *Deleted

## 2014-01-03 NOTE — Telephone Encounter (Signed)
Message copied by Mena Goes on Fri Jan 03, 2014  1:15 PM ------      Message from: Viann Fish      Created: Thu Jan 02, 2014  6:19 PM      Regarding: FW: her EVAR MRI safe?       Zigmund Daniel,            Will you relay to patient Dr. Evelena Leyden response?      Thank you,      Vinnie Level            ----- Message -----         From: Mal Misty, MD         Sent: 01/02/2014   9:34 AM           To: Sharmon Leyden Nickel, NP      Subject: RE: her EV[facility] MRI safe?                                   Vinnie Level      It is not a contraindication to MRI-okay to proceed       JDL      ----- Message -----         From: Viann Fish, NP         Sent: 12/30/2013  10:39 AM           To: Mal Misty, MD      Subject: her EVAR MRI safe?                                       Dr. Kellie Simmering,            Pt has frequent imaging due to liver cancer and was told by an MRI tech that due to her EVAR she cannot have an MRI.      Does her EVAR have a ferrous metal, which I think is the only metal that is attracted to an MRI.      I think, but not sure, that titanium is MRI safe.      Thank you,      Vinnie Level             ------

## 2014-01-03 NOTE — Telephone Encounter (Signed)
Left message for patient on her mobile phone that MRI was OK after EVAR per Dr. Evelena Leyden message below.

## 2014-01-07 ENCOUNTER — Ambulatory Visit
Admission: RE | Admit: 2014-01-07 | Discharge: 2014-01-07 | Disposition: A | Discharge status: Home / Self Care | Payer: Medicare Other | Source: Ambulatory Visit | Attending: Interventional Radiology | Admitting: Interventional Radiology

## 2014-01-07 DIAGNOSIS — C787 Secondary malignant neoplasm of liver and intrahepatic bile duct: Secondary | ICD-10-CM

## 2014-01-08 ENCOUNTER — Other Ambulatory Visit: Payer: Medicare Other

## 2014-01-17 ENCOUNTER — Encounter: Payer: Self-pay | Admitting: *Deleted

## 2014-01-17 NOTE — Progress Notes (Signed)
RECEIVED A FAX FROM [facility] CONCERNING A CONFIRMATION OF PRESCRIPTION SHIPMENT FOR CAPECITABINE ON 01/16/14.

## 2014-01-22 ENCOUNTER — Inpatient Hospital Stay: Admission: RE | Admit: 2014-01-22 | Payer: Medicare Other | Source: Ambulatory Visit

## 2014-01-22 ENCOUNTER — Ambulatory Visit
Admission: RE | Admit: 2014-01-22 | Discharge: 2014-01-22 | Disposition: A | Discharge status: Home / Self Care | Payer: Medicare Other | Source: Ambulatory Visit | Attending: Interventional Radiology | Admitting: Interventional Radiology

## 2014-01-22 NOTE — Progress Notes (Signed)
Chief Complaint: Chief Complaint  Patient presents with  . Follow-up   History of Present Illness: Ashley Davenport is a 80 y.o. female status post yttrium-90 radioembolization of the right lobe of the liver on 10/22/2013 to treatment; cancer. The patient is doing well posttreatment and remains on maintenance Xeloda. She is scheduled to see Dr. Alen Blew in followup on 10/27. She is scheduled to have left cataract surgery on 02/10/2014. Her only complaint today is some increase in tingling and burning sensation in the hands and tingling and numbness in the toes and feet related to peripheral neuropathy.  Past Medical History  Diagnosis Date  . AAA (abdominal aortic aneurysm) 2011  . Hyperlipidemia   . Hypertension   . Shingles     POST HERPATIC PAIN FROM SHINGLES  . Cancer     Rectal  . History of acute gouty arthritis   . Rectal cancer   . Kidney cysts   . Anemia     HX OF ANEMIA WITH CHEMO 2008  . Hypothyroidism     Past Surgical History  Procedure Laterality Date  . Abdominal hysterectomy  1961    total  . Tonsillectomy  1949  . Bunionectomy Bilateral   . Rectal tumor removed  2008    CANCEROUS  . Abdominal aorta aneurysm repair -stent  2011  . Portacath placement N/A 09/24/2012    Procedure: INSERTION PORT-A-CATH;  Surgeon: Stark Klein, MD;  Location: [facility];  Service: General;  Laterality: N/A;  . Abdominal aortic aneurysm repair    . Hepatic/embolization arteriogram with y-90 radioembolization  10/21/13    Allergies: Shellfish allergy; Tramadol hcl; Iodine; and Meperidine hcl  Medications: Prior to Admission medications   Medication Sig Start Date End Date Taking? Authorizing Provider  allopurinol (ZYLOPRIM) 300 MG tablet Take 150 mg by mouth every morning.    Historical Provider, MD  amLODipine (NORVASC) 5 MG tablet Take 5 mg by mouth every morning.     Historical Provider, MD  Biotin 5000 MCG CAPS Take 5,000 mcg by mouth every morning.     Historical  Provider, MD  capecitabine (XELODA) 500 MG tablet Take 4 tablets (2,000 mg total) by mouth 2 (two) times daily after a meal. 11/12/13   Wyatt Portela, MD  Coenzyme Q10 (COQ-10) 200 MG CAPS Take 200 mg by mouth at bedtime.     Historical Provider, MD  ferrous sulfate 325 (65 FE) MG tablet Take 325 mg by mouth daily with breakfast.    Historical Provider, MD  furosemide (LASIX) 20 MG tablet Take 20 mg by mouth every morning.     Historical Provider, MD  irbesartan (AVAPRO) 300 MG tablet Take 300 mg by mouth every morning.    Historical Provider, MD  levothyroxine (SYNTHROID, LEVOTHROID) 75 MCG tablet Take 75 mcg by mouth every morning.     Historical Provider, MD  lidocaine-prilocaine (EMLA) cream Apply 1 application topically daily as needed (Applies to port-a-cath.).    Historical Provider, MD  ondansetron (ZOFRAN) 8 MG tablet Take 8 mg by mouth every 8 (eight) hours as needed for nausea or vomiting.    Historical Provider, MD  rosuvastatin (CRESTOR) 10 MG tablet Take 10 mg by mouth every evening.    Historical Provider, MD    Family History  Problem Relation Age of Onset  . Hypertension Mother   . Heart disease Mother   . Heart attack Mother   . Hypertension Father   . Heart disease Father   . Diabetes  Father   . Heart attack Father   . Cancer Sister     ovarian  . Diabetes Sister   . Hyperlipidemia Sister   . Hypertension Sister   . Kidney disease Sister   . Cancer Cousin     breast  . Cancer Cousin     breast  . Diabetes Daughter   . Hyperlipidemia Daughter   . Hypertension Daughter     History   Social History  . Marital Status: Single    Spouse Name: N/A    Number of Children: N/A  . Years of Education: N/A   Social History Main Topics  . Smoking status: Former Smoker -- 1.00 packs/day for 20 years    Types: Cigarettes    Quit date: 04/04/1988  . Smokeless tobacco: Never Used  . Alcohol Use: Yes     Comment: RARE  . Drug Use: No  . Sexual Activity: No    Other Topics Concern  . Not on file   Social History Narrative  . No narrative on file    ECOG Status: 1 - Symptomatic but completely ambulatory  Review of Systems: A 12 point ROS discussed and pertinent positives are indicated in the HPI above.  All other systems are negative.  Review of Systems  Constitutional: Negative.   HENT: Negative.   Respiratory: Negative.   Cardiovascular: Negative.   Gastrointestinal: Negative.  Negative for nausea, vomiting, abdominal pain, diarrhea, blood in stool and abdominal distention.  Genitourinary: Negative.   Musculoskeletal: Negative.   Neurological: Negative.     Vital Signs: BP 132/70  Pulse 67  Temp(Src) 97.8 F (36.6 C) (Oral)  Resp 15  SpO2 100%  Physical Exam  Constitutional: She is oriented to person, place, and time. She appears well-developed. No distress.  Abdominal: Soft. Bowel sounds are normal. She exhibits no distension and no mass. There is no tenderness. There is no rebound and no guarding.  Neurological: She is alert and oriented to person, place, and time.  Skin: Skin is warm and dry. No rash noted. No erythema.    Imaging: PET scan was performed on 12/18/2013. This shows diminished activity associated with the dominant metastatic lesion in the left lobe of the liver. There is increased activity within a solitary right hepatic metastasis.  Labs:  CBC:  Recent Labs  10/10/13 0500 10/21/13 1146 10/30/13 0926 12/16/13 0858  WBC 6.7 6.7 6.1 5.7  HGB 10.2* 9.5* 10.4* 10.0*  HCT 31.0* 27.8* 31.5* 30.3*  PLT 309 294 275 229    COAGS:  Recent Labs  01/25/13 0805 10/09/13 1020 10/21/13 1146  INR 0.95 1.01 1.01  APTT 31 28  --     BMP:  Recent Labs  10/10/13 0500 10/17/13 1016 10/21/13 1146 10/22/13 0510 10/30/13 0926 12/16/13 0858  NA 137 140 138 137 140 139  K 5.1 4.9 4.3 4.9 4.5 3.7  CL 103 103 104 105  --   --   CO2 22 21 20 21 25  19*  GLUCOSE 101* 137* 152* 110* 110 136  BUN 38*  33* 39* 38* 28.5* 33.3*  CALCIUM 9.5 9.6 9.5 9.2 9.6 9.2  CREATININE 2.10* 2.12* 2.19* 2.13* 2.0* 2.0*  GFRNONAA 21* 21* 20* 21*  --   --   GFRAA 24* 24* 23* 24*  --   --     LIVER FUNCTION TESTS:  Recent Labs  10/21/13 1146 10/22/13 0510 10/30/13 0926 12/16/13 0858  BILITOT 0.3 0.2* 0.55 0.41  AST 20 16 21  24  ALT 9 7 10 12   ALKPHOS 52 50 51 64  PROT 6.8 6.1 6.9 6.8  ALBUMIN 3.3* 2.8* 3.3* 3.3*    TUMOR MARKERS:  Recent Labs  03/04/13 1408 03/19/13 0838 08/06/13 0844 09/12/13 0949  CEA 4.7 4.3 4.5 9.6*    Assessment and Plan:  The patient is scheduled to followup with Dr. Alen Blew next week. She will need a followup CEA level.  PET scan shows some response to treatment with decrease in metabolic activity within the dominant left lobe metastasis. Metabolic activity remains in this tumor, predominantly in its lateral aspect which may implicate that there is some right hepatic arterial supply to the dominant mass as only the left hepatic artery was treated with initial radial embolization. A solitary right lobe metastasis to show increase in size and metabolic activity compared to a PET scan in June of last year.  Imaging findings were reviewed with the patient. Will await Dr. Hazeline Junker recommendation regarding potentially further systemic chemotherapy. I did tell the patient that she would potentially benefit from a second yttrium-90 radioembolization procedure to the right lobe of the liver.  She tolerated left lobe treatment quite well with no complication and no effect on liver function tests. There is no contraindication in performing radioembolization during Xeloda therapy.  I spent a total of 20 minutes face to face in clinical consultation, greater than 50% of which was counseling/coordinating care for metastatic colon carcinoma to the liver.   Venetia Night. Kathlene Cote, M.D. Pager:  [phone]

## 2014-01-28 ENCOUNTER — Other Ambulatory Visit (HOSPITAL_BASED_OUTPATIENT_CLINIC_OR_DEPARTMENT_OTHER): Payer: Medicare Other

## 2014-01-28 ENCOUNTER — Ambulatory Visit (HOSPITAL_BASED_OUTPATIENT_CLINIC_OR_DEPARTMENT_OTHER): Payer: Medicare Other

## 2014-01-28 ENCOUNTER — Ambulatory Visit (HOSPITAL_BASED_OUTPATIENT_CLINIC_OR_DEPARTMENT_OTHER): Payer: Medicare Other | Admitting: Oncology

## 2014-01-28 ENCOUNTER — Telehealth: Payer: Self-pay | Admitting: Oncology

## 2014-01-28 VITALS — BP 159/72 | HR 71 | Temp 97.4°F | Resp 18 | Ht 64.0 in | Wt 173.5 lb

## 2014-01-28 DIAGNOSIS — C20 Malignant neoplasm of rectum: Secondary | ICD-10-CM

## 2014-01-28 DIAGNOSIS — Z452 Encounter for adjustment and management of vascular access device: Secondary | ICD-10-CM

## 2014-01-28 DIAGNOSIS — D649 Anemia, unspecified: Secondary | ICD-10-CM

## 2014-01-28 DIAGNOSIS — I10 Essential (primary) hypertension: Secondary | ICD-10-CM

## 2014-01-28 DIAGNOSIS — Z95828 Presence of other vascular implants and grafts: Secondary | ICD-10-CM

## 2014-01-28 DIAGNOSIS — C787 Secondary malignant neoplasm of liver and intrahepatic bile duct: Secondary | ICD-10-CM

## 2014-01-28 DIAGNOSIS — C189 Malignant neoplasm of colon, unspecified: Secondary | ICD-10-CM

## 2014-01-28 LAB — CBC WITH DIFFERENTIAL/PLATELET
BASO%: 0.7 % (ref 0.0–2.0)
Basophils Absolute: 0 10*3/uL (ref 0.0–0.1)
EOS%: 3.7 % (ref 0.0–7.0)
Eosinophils Absolute: 0.2 10*3/uL (ref 0.0–0.5)
HCT: 30.6 % — ABNORMAL LOW (ref 34.8–46.6)
HGB: 9.9 g/dL — ABNORMAL LOW (ref 11.6–15.9)
LYMPH#: 0.6 10*3/uL — AB (ref 0.9–3.3)
LYMPH%: 10.3 % — ABNORMAL LOW (ref 14.0–49.7)
MCH: 33.4 pg (ref 25.1–34.0)
MCHC: 32.4 g/dL (ref 31.5–36.0)
MCV: 103.2 fL — AB (ref 79.5–101.0)
MONO#: 0.6 10*3/uL (ref 0.1–0.9)
MONO%: 9.9 % (ref 0.0–14.0)
NEUT#: 4.5 10*3/uL (ref 1.5–6.5)
NEUT%: 75.4 % (ref 38.4–76.8)
Platelets: 326 10*3/uL (ref 145–400)
RBC: 2.97 10*6/uL — ABNORMAL LOW (ref 3.70–5.45)
RDW: 17.5 % — AB (ref 11.2–14.5)
WBC: 5.9 10*3/uL (ref 3.9–10.3)

## 2014-01-28 LAB — COMPREHENSIVE METABOLIC PANEL (CC13)
ALT: 13 U/L (ref 0–55)
AST: 23 U/L (ref 5–34)
Albumin: 3.3 g/dL — ABNORMAL LOW (ref 3.5–5.0)
Alkaline Phosphatase: 78 U/L (ref 40–150)
Anion Gap: 9 mEq/L (ref 3–11)
BUN: 35.3 mg/dL — ABNORMAL HIGH (ref 7.0–26.0)
CALCIUM: 9.7 mg/dL (ref 8.4–10.4)
CHLORIDE: 110 meq/L — AB (ref 98–109)
CO2: 21 mEq/L — ABNORMAL LOW (ref 22–29)
CREATININE: 1.8 mg/dL — AB (ref 0.6–1.1)
Glucose: 122 mg/dl (ref 70–140)
POTASSIUM: 4.2 meq/L (ref 3.5–5.1)
Sodium: 140 mEq/L (ref 136–145)
Total Bilirubin: 0.48 mg/dL (ref 0.20–1.20)
Total Protein: 7.1 g/dL (ref 6.4–8.3)

## 2014-01-28 MED ORDER — SODIUM CHLORIDE 0.9 % IJ SOLN
10.0000 mL | INTRAMUSCULAR | Status: DC | PRN
  Administered 2014-01-28: 10 mL via INTRAVENOUS
  Filled 2014-01-28: qty 10

## 2014-01-28 MED ORDER — HEPARIN SOD (PORK) LOCK FLUSH 100 UNIT/ML IV SOLN
500.0000 [IU] | Freq: Once | INTRAVENOUS | Status: AC
  Administered 2014-01-28: 500 [IU] via INTRAVENOUS
  Filled 2014-01-28: qty 5

## 2014-01-28 NOTE — Progress Notes (Signed)
Hematology and Oncology Follow Up Visit  Ashley Davenport [id number] 1930/08/31 80 y.o. 01/28/2014 10:38 AM Ashley Davenport, MDClark, Don Broach, MD   Principle Diagnosis: 80 year old woman with rectal cancer diagnosed  on  09/01/2006. She hadT3 N0 moderately differentiated adenocarcinoma of the rectum with evidence of LV invasion. Now she has stage IV disease with liver mets.    Prior Therapy: She is status post neoadjuvant continuous infusion 5-FU with external radiation therapy between July 10, 2006 and Sep 01, 2006.  She declined abdominoperineal resection. She went on to receive additional 3 cycles of Xeloda, but developed hand-foot syndrome and declined further therapy.  She received FOLFIRI + Avastin. First cycle given on 09/28/12 with FOLFIRI alone. Cycle 4 was delayed due to grade 2 fatigue and grade 2-3 diarrhea. She is status post 12 cycles completed in December of 2014.  She is status post liver directed therapy in the form of Y-90 SIR sphere radioembolization. This was done on 10/22/2013  Current therapy: She started maintenance chemotherapy with oral Xeloda in 05/2013. She receives 1000 mg BID 2 weeks on and 1 week off.   Interim History: Ashley Davenport presents today for a follow up visit. Since the last visit, she has no new complaints. She continues to be on Xeloda without any issues. 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 This predominantly grade 1 sensory without any interference with her activities of daily living. Appetite is improving and she is slowly gaining weight. She is reporting no hand foot syndrome without increased pain and no skin cracking or desquamation. No nausea. Vomiting, or diarrhea. No chest pain, shortness of breath, or dyspnea. She continued to have excellent quality of life and have regained most activities of daily living. She has not reported any genitourinary or gastrointestinal symptoms. She has not reported  any neurological symptoms or cardiovascular symptoms. She has not reported any chest pain difficulty breathing cough or hemoptysis or hematemesis. She has not reported any lymphadenopathy or petechiae. The rest of review of systems was unremarkable.  Medications: I have reviewed the patient's current medications.     Allergies:  Allergies  Allergen Reactions  . Shellfish Allergy Itching and Rash    Feels itching internally.  . Tramadol Hcl Swelling    REACTION: Tongue and lips swell  . Iodine Itching  . Meperidine Hcl Other (See Comments)    REACTION: Hypotension      Physical Exam: Blood pressure 159/72, pulse 71, temperature 97.4 F (36.3 C), temperature source Oral, resp. rate 18, height 5\' 4"  (1.626 m), weight 173 lb 8 oz (78.699 kg), SpO2 100.00%. ECOG: 1 General appearance: alert awake not in any distress. Head: Normocephalic, no lesions. Neck: no adenopathy, no thyromegaly. Lymph nodes: Cervical, supraclavicular, and axillary nodes normal. Heart:regular rate and rhythm, S1, S2 normal, no murmur, click, rub or gallop.  Lung:chest clear, no wheezing, rales, normal symmetric air entry Abdomen: soft, non-tender, without masses or organomegaly EXT: I cannot appreciate any erythema or induration.   Lab Results: Lab Results  Component Value Date   WBC 5.9 01/28/2014   HGB 9.9* 01/28/2014   HCT 30.6* 01/28/2014   MCV 103.2* 01/28/2014   PLT 326 01/28/2014     Chemistry      Component Value Date/Time   NA 140 01/28/2014 0940   NA 137 10/22/2013 0510   K 4.2 01/28/2014 0940   K 4.9 10/22/2013 0510   CL 105 10/22/2013 0510   CL 102 09/25/2012  0933   CO2 21* 01/28/2014 0940   CO2 21 10/22/2013 0510   BUN 35.3* 01/28/2014 0940   BUN 38* 10/22/2013 0510   CREATININE 1.8* 01/28/2014 0940   CREATININE 2.13* 10/22/2013 0510      Component Value Date/Time   CALCIUM 9.7 01/28/2014 0940   CALCIUM 9.2 10/22/2013 0510   ALKPHOS 78 01/28/2014 0940   ALKPHOS 50 10/22/2013 0510    AST 23 01/28/2014 0940   AST 16 10/22/2013 0510   ALT 13 01/28/2014 0940   ALT 7 10/22/2013 0510   BILITOT 0.48 01/28/2014 0940   BILITOT 0.2* 10/22/2013 0510      EXAM:  NUCLEAR MEDICINE PET SKULL BASE TO THIGH  TECHNIQUE:  8.6 mCi F-18 FDG was injected intravenously. Full-ring PET imaging  was performed from the skull base to thigh after the radiotracer. CT  data was obtained and used for attenuation correction and anatomic  localization.  FASTING BLOOD GLUCOSE: Value: 110 mg/dl  COMPARISON: 09/21/12  FINDINGS:  NECK  No hypermetabolic lymph nodes in the neck.  CHEST  No hypermetabolic mediastinal or hilar nodes. No suspicious  pulmonary nodules on the CT scan.  ABDOMEN/PELVIS  Dominant hepatic metastasis centered in the medial segment left lobe  shows interval decrease in size and hypermetabolic activity since  previous study. This now has an SUV max of 19.0 compared to 28.9  previously.  A small hypermetabolic metastasis in the dome of the right hepatic  lobe shows mild increase in size and hypermetabolic activity  compared to prior exam. This now has an SUV max of 12.4 compared to  7.5 previously. No new hypermetabolic liver metastases identified.  No hypermetabolic lymph nodes in the abdomen or pelvis. Stable  appearance of stent graft repair of abdominal aortic aneurysm.  SKELETON  No focal hypermetabolic activity to suggest skeletal metastasis.  IMPRESSION:  Mixed response with improvement in dominant liver metastasis in the  left hepatic lobe, and mild increase in smaller right hepatic lobe  metastasis compared to prior exam.  No other sites of metastatic disease identified.    Impression and Plan:  Ashley Davenport is a 80 year old woman with:  1. T3 N0 moderately differentiated adenocarcinoma of the rectum with evidence of LV invasion and positive surgical margins 05/30/2006. She has metastatic disease to the liver and status post chemotherapy. She is currently on  maintenance Xeloda. She is status post Y-90 SIR sphere radioembolization. This was done on 10/22/2013.  Her last PET CT scan on 12/18/2013 and showed mixed response with slight increase in the right hepatic lobe lesion. I do agree with Dr. Kathlene Cote that if repeat radioembolization as possible, then this would be the treatment of choice. Given the bulk of her disease, I would like to defer systemic chemotherapy to a later date and time.   2. Anemia: S/P Feraheme. Anemia is multifactorial in nature we'll continue to monitor.  3. Nausea and vomiting prophylaxis: On Zofran and compazine. This is a no longer an issue  4. HTN: She is on amlodipine and valsartan.   5. Port-A-Cath management: This was flushed today and will be repeated in 6 weeks.  6. Followup: In  6 weeks.    [employer] 10/27/201510:38 AM

## 2014-01-28 NOTE — Patient Instructions (Signed)

## 2014-01-28 NOTE — Telephone Encounter (Signed)
Pt confirmed labs/ov per 10/27 POF, gave pt AVS..... KJ

## 2014-01-29 ENCOUNTER — Other Ambulatory Visit: Payer: Self-pay | Admitting: Nurse Practitioner

## 2014-02-10 ENCOUNTER — Encounter: Payer: Self-pay | Admitting: *Deleted

## 2014-02-10 NOTE — Progress Notes (Signed)
RECEIVED A FAX FROM [facility] CONCERNING A CONFIRMATION OF PRESCRIPTION SHIPMENT FOR CAPECITABINE ON 02/07/14.

## 2014-02-11 ENCOUNTER — Other Ambulatory Visit (HOSPITAL_COMMUNITY): Payer: Self-pay | Admitting: Interventional Radiology

## 2014-02-11 DIAGNOSIS — C787 Secondary malignant neoplasm of liver and intrahepatic bile duct: Secondary | ICD-10-CM

## 2014-02-12 ENCOUNTER — Other Ambulatory Visit: Payer: Self-pay | Admitting: Interventional Radiology

## 2014-02-12 DIAGNOSIS — C787 Secondary malignant neoplasm of liver and intrahepatic bile duct: Principal | ICD-10-CM

## 2014-02-12 DIAGNOSIS — C189 Malignant neoplasm of colon, unspecified: Secondary | ICD-10-CM

## 2014-02-20 ENCOUNTER — Other Ambulatory Visit: Payer: Self-pay | Admitting: *Deleted

## 2014-02-20 DIAGNOSIS — C20 Malignant neoplasm of rectum: Secondary | ICD-10-CM

## 2014-02-20 NOTE — Progress Notes (Signed)
Patient notified of appt for lab 03/07/14. before y-90 treatment on 03/11/14.

## 2014-02-21 ENCOUNTER — Telehealth: Payer: Self-pay | Admitting: Oncology

## 2014-02-21 NOTE — Telephone Encounter (Signed)
S/w pt confirmed labs/ov per 11/19 POF, gave pt AVS.... KJ

## 2014-02-28 ENCOUNTER — Encounter: Payer: Self-pay | Admitting: *Deleted

## 2014-02-28 NOTE — Progress Notes (Signed)
RECEIVED A FAX FROM [facility] CONCERNING A CONFIRMATION OF PRESCRIPTION SHIPMENT FOR CAPECITABINE ON 02/26/14.

## 2014-03-03 ENCOUNTER — Ambulatory Visit: Payer: Medicare Other

## 2014-03-03 DIAGNOSIS — C20 Malignant neoplasm of rectum: Secondary | ICD-10-CM

## 2014-03-03 LAB — HEPATIC FUNCTION PANEL
ALT: 10 U/L (ref 0–35)
AST: 29 U/L (ref 0–37)
Albumin: 3.9 g/dL (ref 3.5–5.2)
Alkaline Phosphatase: 90 U/L (ref 39–117)
BILIRUBIN INDIRECT: 0.3 mg/dL (ref 0.2–1.2)
Bilirubin, Direct: 0.1 mg/dL (ref 0.0–0.3)
Total Bilirubin: 0.4 mg/dL (ref 0.2–1.2)
Total Protein: 6.6 g/dL (ref 6.0–8.3)

## 2014-03-04 ENCOUNTER — Other Ambulatory Visit: Payer: Medicare Other

## 2014-03-07 ENCOUNTER — Other Ambulatory Visit (HOSPITAL_BASED_OUTPATIENT_CLINIC_OR_DEPARTMENT_OTHER): Payer: Medicare Other

## 2014-03-07 ENCOUNTER — Encounter: Payer: Self-pay | Admitting: Physician Assistant

## 2014-03-07 ENCOUNTER — Ambulatory Visit (HOSPITAL_BASED_OUTPATIENT_CLINIC_OR_DEPARTMENT_OTHER): Payer: Medicare Other | Admitting: Physician Assistant

## 2014-03-07 VITALS — BP 143/64 | HR 66 | Temp 97.7°F | Resp 18 | Ht 64.0 in | Wt 173.1 lb

## 2014-03-07 DIAGNOSIS — C189 Malignant neoplasm of colon, unspecified: Secondary | ICD-10-CM

## 2014-03-07 DIAGNOSIS — C787 Secondary malignant neoplasm of liver and intrahepatic bile duct: Secondary | ICD-10-CM

## 2014-03-07 DIAGNOSIS — C20 Malignant neoplasm of rectum: Secondary | ICD-10-CM

## 2014-03-07 DIAGNOSIS — D649 Anemia, unspecified: Secondary | ICD-10-CM

## 2014-03-07 LAB — CEA: CEA: 54.1 ng/mL — AB (ref 0.0–5.0)

## 2014-03-07 LAB — COMPREHENSIVE METABOLIC PANEL (CC13)
ALBUMIN: 3.4 g/dL — AB (ref 3.5–5.0)
ALT: 13 U/L (ref 0–55)
AST: 26 U/L (ref 5–34)
Alkaline Phosphatase: 89 U/L (ref 40–150)
Anion Gap: 11 mEq/L (ref 3–11)
BILIRUBIN TOTAL: 0.41 mg/dL (ref 0.20–1.20)
BUN: 42.3 mg/dL — AB (ref 7.0–26.0)
CO2: 20 mEq/L — ABNORMAL LOW (ref 22–29)
Calcium: 9.9 mg/dL (ref 8.4–10.4)
Chloride: 106 mEq/L (ref 98–109)
Creatinine: 2.6 mg/dL — ABNORMAL HIGH (ref 0.6–1.1)
EGFR: 19 mL/min/{1.73_m2} — AB (ref >90)
GLUCOSE: 130 mg/dL (ref 70–140)
Potassium: 4.3 mEq/L (ref 3.5–5.1)
Sodium: 137 mEq/L (ref 136–145)
Total Protein: 7.4 g/dL (ref 6.4–8.3)

## 2014-03-07 LAB — CBC WITH DIFFERENTIAL/PLATELET
BASO%: 0.4 % (ref 0.0–2.0)
Basophils Absolute: 0 10*3/uL (ref 0.0–0.1)
EOS%: 3.9 % (ref 0.0–7.0)
Eosinophils Absolute: 0.3 10*3/uL (ref 0.0–0.5)
HEMATOCRIT: 32.2 % — AB (ref 34.8–46.6)
HEMOGLOBIN: 10.5 g/dL — AB (ref 11.6–15.9)
LYMPH%: 10.8 % — ABNORMAL LOW (ref 14.0–49.7)
MCH: 33.2 pg (ref 25.1–34.0)
MCHC: 32.6 g/dL (ref 31.5–36.0)
MCV: 101.9 fL — ABNORMAL HIGH (ref 79.5–101.0)
MONO#: 0.4 10*3/uL (ref 0.1–0.9)
MONO%: 5.2 % (ref 0.0–14.0)
NEUT#: 5.3 10*3/uL (ref 1.5–6.5)
NEUT%: 79.7 % — ABNORMAL HIGH (ref 38.4–76.8)
Platelets: 391 10*3/uL (ref 145–400)
RBC: 3.16 10*6/uL — ABNORMAL LOW (ref 3.70–5.45)
RDW: 16.6 % — ABNORMAL HIGH (ref 11.2–14.5)
WBC: 6.7 10*3/uL (ref 3.9–10.3)
lymph#: 0.7 10*3/uL — ABNORMAL LOW (ref 0.9–3.3)
nRBC: 0 % (ref 0–0)

## 2014-03-07 NOTE — Patient Instructions (Signed)
Continue Xeloda at the current dose and frequency Use a heavier moisturizer for your hands, particularly at night Keep your scheduled appointment with Dr. Kathlene Davenport for the Y-90 procedure Follow up as scheduled on 03/18/2014

## 2014-03-07 NOTE — Progress Notes (Signed)
Hematology and Oncology Follow Up Visit  Ashley Davenport [id number] 1930/05/01 80 y.o. 03/07/2014 2:06 PM Ashley Davenport, MDClark, Ashley Broach, MD   Principle Diagnosis: 80 year old woman with rectal cancer diagnosed  on  09/01/2006. She hadT3 N0 moderately differentiated adenocarcinoma of the rectum with evidence of LV invasion. Now she has stage IV disease with liver mets.    Prior Therapy: She is status post neoadjuvant continuous infusion 5-FU with external radiation therapy between July 10, 2006 and Sep 01, 2006.  She declined abdominoperineal resection. She went on to receive additional 3 cycles of Xeloda, but developed hand-foot syndrome and declined further therapy.  She received FOLFIRI + Avastin. First cycle given on 09/28/12 with FOLFIRI alone. Cycle 4 was delayed due to grade 2 fatigue and grade 2-3 diarrhea. She is status post 12 cycles completed in December of 2014.  She is status post liver directed therapy in the form of Y-90 SIR sphere radioembolization. This was done on 10/22/2013  Current therapy: She started maintenance chemotherapy with oral Xeloda in 05/2013. She receives 1000 mg BID 2 weeks on and 1 week off.   Interim History: Ashley Davenport presents today for a follow up visit. Since the last visit, she has no new complaints. She continues to be on Xeloda without any issues except for some dryness around the periphery of her palms.  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 This predominantly grade 1 sensory without any interference with her activities of daily living. Appetite is good and her weight is stable. She is reporting no hand foot syndrome, without increased pain and no skin cracking or desquamation. No nausea. Vomiting, or diarrhea. No chest pain, shortness of breath, or dyspnea. She continued to have excellent quality of life and have regained most activities of daily living. She has not reported any genitourinary or  gastrointestinal symptoms. She has not reported any neurological symptoms or cardiovascular symptoms. She has not reported any chest pain difficulty breathing cough or hemoptysis or hematemesis. She has not reported any lymphadenopathy or petechiae. She reports that she is to have another Y 90 procedure again on 03/11/2014 under the care of Dr. Kathlene Cote. The remainder of review of systems was unremarkable.  Medications: I have reviewed the patient's current medications.     Allergies:  Allergies  Allergen Reactions  . Shellfish Allergy Itching and Rash    Feels itching internally.  . Tramadol Hcl Swelling    REACTION: Tongue and lips swell  . Iodine Itching  . Meperidine Hcl Other (See Comments)    REACTION: Hypotension      Physical Exam: Blood pressure 143/64, pulse 66, temperature 97.7 F (36.5 C), temperature source Oral, resp. rate 18, height 5\' 4"  (1.626 m), weight 173 lb 1.6 oz (78.518 kg). ECOG: 1 General appearance: alert awake not in any distress. Head: Normocephalic, no lesions. Neck: no adenopathy, no thyromegaly. Lymph nodes: Cervical, supraclavicular, and axillary nodes normal. Heart:regular rate and rhythm, S1, S2 normal, no murmur, click, rub or gallop.  Lung:chest clear, no wheezing, rales, normal symmetric air entry Abdomen: soft, non-tender, without masses or organomegaly EXT: I cannot appreciate any erythema or induration.   Lab Results: Lab Results  Component Value Date   WBC 5.9 01/28/2014   HGB 9.9* 01/28/2014   HCT 30.6* 01/28/2014   MCV 103.2* 01/28/2014   PLT 326 01/28/2014     Chemistry      Component Value Date/Time   NA 140 01/28/2014 0940  NA 137 10/22/2013 0510   K 4.2 01/28/2014 0940   K 4.9 10/22/2013 0510   CL 105 10/22/2013 0510   CL 102 09/25/2012 0933   CO2 21* 01/28/2014 0940   CO2 21 10/22/2013 0510   BUN 35.3* 01/28/2014 0940   BUN 38* 10/22/2013 0510   CREATININE 1.8* 01/28/2014 0940   CREATININE 2.13* 10/22/2013 0510       Component Value Date/Time   CALCIUM 9.7 01/28/2014 0940   CALCIUM 9.2 10/22/2013 0510   ALKPHOS 90 03/03/2014 1422   ALKPHOS 78 01/28/2014 0940   AST 29 03/03/2014 1422   AST 23 01/28/2014 0940   ALT 10 03/03/2014 1422   ALT 13 01/28/2014 0940   BILITOT 0.4 03/03/2014 1422   BILITOT 0.48 01/28/2014 0940      EXAM:  NUCLEAR MEDICINE PET SKULL BASE TO THIGH  TECHNIQUE:  8.6 mCi F-18 FDG was injected intravenously. Full-ring PET imaging  was performed from the skull base to thigh after the radiotracer. CT  data was obtained and used for attenuation correction and anatomic  localization.  FASTING BLOOD GLUCOSE: Value: 110 mg/dl  COMPARISON: 09/21/12  FINDINGS:  NECK  No hypermetabolic lymph nodes in the neck.  CHEST  No hypermetabolic mediastinal or hilar nodes. No suspicious  pulmonary nodules on the CT scan.  ABDOMEN/PELVIS  Dominant hepatic metastasis centered in the medial segment left lobe  shows interval decrease in size and hypermetabolic activity since  previous study. This now has an SUV max of 19.0 compared to 28.9  previously.  A small hypermetabolic metastasis in the dome of the right hepatic  lobe shows mild increase in size and hypermetabolic activity  compared to prior exam. This now has an SUV max of 12.4 compared to  7.5 previously. No new hypermetabolic liver metastases identified.  No hypermetabolic lymph nodes in the abdomen or pelvis. Stable  appearance of stent graft repair of abdominal aortic aneurysm.  SKELETON  No focal hypermetabolic activity to suggest skeletal metastasis.  IMPRESSION:  Mixed response with improvement in dominant liver metastasis in the  left hepatic lobe, and mild increase in smaller right hepatic lobe  metastasis compared to prior exam.  No other sites of metastatic disease identified.    Impression and Plan:  Ashley Davenport is a 80 year old woman with:  1. T3 N0 moderately differentiated adenocarcinoma of the rectum  with evidence of LV invasion and positive surgical margins 05/30/2006. She has metastatic disease to the liver and status post chemotherapy. She is currently on maintenance Xeloda. She is status post Y-90 SIR sphere radioembolization. This was done on 10/22/2013.she is scheduled for another Y 90 SIR sphere radioembolization on 03/11/2014 under the care Dr. Kathlene Cote.  Her last PET CT scan on 12/18/2013 and showed mixed response with slight increase in the right hepatic lobe lesion.  Given the bulk of her disease, Dr. Alen Blew would like to defer systemic chemotherapy to a later date and time.   2. Anemia: S/P Feraheme. Anemia is multifactorial in nature we'll continue to monitor.  3. Nausea and vomiting prophylaxis: On Zofran and compazine. This is a no longer an issue  4. HTN: She is on amlodipine and valsartan.   5. Port-A-Cath management: This will be flushed regularly every 6 weeks- next due 03/18/2014  6. Followup: In  As previously scheduled on 03/18/2014.    Awilda Metro E, PA-C 12/4/20152:06 PM

## 2014-03-10 ENCOUNTER — Other Ambulatory Visit: Payer: Self-pay | Admitting: Radiology

## 2014-03-11 ENCOUNTER — Encounter (HOSPITAL_COMMUNITY)
Admission: RE | Admit: 2014-03-11 | Discharge: 2014-03-11 | Disposition: A | Discharge status: Home / Self Care | Payer: Medicare Other | Source: Ambulatory Visit | Attending: Interventional Radiology | Admitting: Interventional Radiology

## 2014-03-11 ENCOUNTER — Ambulatory Visit (HOSPITAL_COMMUNITY)
Admission: RE | Admit: 2014-03-11 | Discharge: 2014-03-11 | Disposition: A | Discharge status: Home / Self Care | Payer: Medicare Other | Source: Ambulatory Visit | Attending: Interventional Radiology | Admitting: Interventional Radiology

## 2014-03-11 ENCOUNTER — Encounter (HOSPITAL_COMMUNITY): Payer: Self-pay

## 2014-03-11 ENCOUNTER — Other Ambulatory Visit: Payer: Self-pay | Admitting: Interventional Radiology

## 2014-03-11 DIAGNOSIS — D6481 Anemia due to antineoplastic chemotherapy: Secondary | ICD-10-CM | POA: Insufficient documentation

## 2014-03-11 DIAGNOSIS — C19 Malignant neoplasm of rectosigmoid junction: Secondary | ICD-10-CM | POA: Insufficient documentation

## 2014-03-11 DIAGNOSIS — C189 Malignant neoplasm of colon, unspecified: Secondary | ICD-10-CM

## 2014-03-11 DIAGNOSIS — C787 Secondary malignant neoplasm of liver and intrahepatic bile duct: Secondary | ICD-10-CM | POA: Diagnosis not present

## 2014-03-11 DIAGNOSIS — I10 Essential (primary) hypertension: Secondary | ICD-10-CM | POA: Diagnosis not present

## 2014-03-11 DIAGNOSIS — B0229 Other postherpetic nervous system involvement: Secondary | ICD-10-CM | POA: Diagnosis not present

## 2014-03-11 DIAGNOSIS — E039 Hypothyroidism, unspecified: Secondary | ICD-10-CM | POA: Insufficient documentation

## 2014-03-11 DIAGNOSIS — N281 Cyst of kidney, acquired: Secondary | ICD-10-CM | POA: Insufficient documentation

## 2014-03-11 DIAGNOSIS — Z79899 Other long term (current) drug therapy: Secondary | ICD-10-CM | POA: Diagnosis not present

## 2014-03-11 DIAGNOSIS — Z9221 Personal history of antineoplastic chemotherapy: Secondary | ICD-10-CM | POA: Diagnosis not present

## 2014-03-11 DIAGNOSIS — E785 Hyperlipidemia, unspecified: Secondary | ICD-10-CM | POA: Diagnosis not present

## 2014-03-11 DIAGNOSIS — I714 Abdominal aortic aneurysm, without rupture: Secondary | ICD-10-CM | POA: Diagnosis not present

## 2014-03-11 LAB — BASIC METABOLIC PANEL
Anion gap: 15 (ref 5–15)
BUN: 47 mg/dL — AB (ref 6–23)
CALCIUM: 9.5 mg/dL (ref 8.4–10.5)
CO2: 20 mEq/L (ref 19–32)
Chloride: 101 mEq/L (ref 96–112)
Creatinine, Ser: 2.47 mg/dL — ABNORMAL HIGH (ref 0.50–1.10)
GFR calc Af Amer: 20 mL/min — ABNORMAL LOW (ref >90)
GFR, EST NON AFRICAN AMERICAN: 17 mL/min — AB (ref >90)
GLUCOSE: 118 mg/dL — AB (ref 70–99)
POTASSIUM: 4.3 meq/L (ref 3.7–5.3)
Sodium: 136 mEq/L — ABNORMAL LOW (ref 137–147)

## 2014-03-11 LAB — PROTIME-INR
INR: 1.02 (ref 0.00–1.49)
PROTHROMBIN TIME: 13.5 s (ref 11.6–15.2)

## 2014-03-11 MED ORDER — FENTANYL CITRATE 0.05 MG/ML IJ SOLN
INTRAMUSCULAR | Status: AC | PRN
  Administered 2014-03-11 (×2): 25 ug via INTRAVENOUS
  Administered 2014-03-11: 50 ug via INTRAVENOUS

## 2014-03-11 MED ORDER — DEXAMETHASONE SODIUM PHOSPHATE 10 MG/ML IJ SOLN
10.0000 mg | Freq: Once | INTRAMUSCULAR | Status: AC
  Administered 2014-03-11: 10 mg via INTRAVENOUS
  Filled 2014-03-11: qty 1

## 2014-03-11 MED ORDER — DEXAMETHASONE SODIUM PHOSPHATE 10 MG/ML IJ SOLN
INTRAMUSCULAR | Status: AC
  Administered 2014-03-11: 10 mg
  Filled 2014-03-11: qty 1

## 2014-03-11 MED ORDER — LIDOCAINE HCL 1 % IJ SOLN
INTRAMUSCULAR | Status: AC
  Filled 2014-03-11: qty 20

## 2014-03-11 MED ORDER — ONDANSETRON HCL 4 MG/2ML IJ SOLN: 4.0000 mg | Freq: Four times a day (QID) | INTRAMUSCULAR | Status: DC | PRN

## 2014-03-11 MED ORDER — MIDAZOLAM HCL 2 MG/2ML IJ SOLN
INTRAMUSCULAR | Status: AC
  Filled 2014-03-11: qty 6

## 2014-03-11 MED ORDER — DIPHENHYDRAMINE HCL 50 MG/ML IJ SOLN
INTRAMUSCULAR | Status: AC
  Administered 2014-03-11: 50 mg
  Filled 2014-03-11: qty 1

## 2014-03-11 MED ORDER — YTTRIUM 90 INJECTION: 32.5000 | INJECTION | Freq: Once | INTRAVENOUS | Status: DC

## 2014-03-11 MED ORDER — PIPERACILLIN-TAZOBACTAM 3.375 G IVPB 30 MIN
3.3750 g | Freq: Once | INTRAVENOUS | Status: AC
  Administered 2014-03-11: 3.375 g via INTRAVENOUS
  Filled 2014-03-11: qty 50

## 2014-03-11 MED ORDER — PANTOPRAZOLE SODIUM 40 MG IV SOLR
40.0000 mg | Freq: Once | INTRAVENOUS | Status: AC
  Administered 2014-03-11: 40 mg via INTRAVENOUS
  Filled 2014-03-11: qty 40

## 2014-03-11 MED ORDER — MIDAZOLAM HCL 2 MG/2ML IJ SOLN
INTRAMUSCULAR | Status: AC | PRN
  Administered 2014-03-11: 1 mg via INTRAVENOUS
  Administered 2014-03-11 (×2): 0.5 mg via INTRAVENOUS
  Administered 2014-03-11: 1 mg via INTRAVENOUS

## 2014-03-11 MED ORDER — FENTANYL CITRATE 0.05 MG/ML IJ SOLN
INTRAMUSCULAR | Status: AC
  Filled 2014-03-11: qty 4

## 2014-03-11 MED ORDER — ONDANSETRON 8 MG/NS 50 ML IVPB
8.0000 mg | Freq: Once | INTRAVENOUS | Status: AC
  Administered 2014-03-11: 8 mg via INTRAVENOUS
  Filled 2014-03-11: qty 8

## 2014-03-11 MED ORDER — DIPHENHYDRAMINE HCL 50 MG/ML IJ SOLN
50.0000 mg | Freq: Once | INTRAMUSCULAR | Status: AC
  Filled 2014-03-11: qty 1

## 2014-03-11 MED ORDER — SODIUM CHLORIDE 0.9 % IV SOLN: INTRAVENOUS | Status: DC

## 2014-03-11 MED ORDER — PIPERACILLIN SOD-TAZOBACTAM SO 2.25 (2-0.25) G IV SOLR
3.3750 g | Freq: Once | INTRAVENOUS | Status: AC
  Filled 2014-03-11: qty 3.38

## 2014-03-11 MED ORDER — SODIUM CHLORIDE 0.9 % IV SOLN
INTRAVENOUS | Status: DC
  Administered 2014-03-11: 08:00:00 via INTRAVENOUS

## 2014-03-11 MED ORDER — PANTOPRAZOLE SODIUM 40 MG IV SOLR
INTRAVENOUS | Status: AC
  Filled 2014-03-11: qty 40

## 2014-03-11 MED ORDER — HEPARIN SOD (PORK) LOCK FLUSH 100 UNIT/ML IV SOLN
500.0000 [IU] | INTRAVENOUS | Status: AC | PRN
  Administered 2014-03-11: 500 [IU]
  Filled 2014-03-11: qty 5

## 2014-03-11 MED ORDER — SODIUM CHLORIDE 0.9 % IJ SOLN
10.0000 mL | INTRAMUSCULAR | Status: AC
  Administered 2014-03-11: 10 mL

## 2014-03-11 NOTE — Procedures (Signed)
Procedure:  Celiac and right hepatic arteriography; Y-90 radioembolization of right hepatic artery Diagnosis:  Metastatic colorectal carcinoma Findings:  Right hepatic artery Y-90 radioembolization performed with SIR spheres and 32 mCi dose. No complications. For NM imaging to follow.

## 2014-03-11 NOTE — Discharge Instructions (Signed)
Post Y-90 Radioembolization Discharge Instructions ° °You have been given a radioactive material during your procedure.  While it is safe for you to be discharged home from the hospital, you need to proceed directly home.   ° °Do not use public transportation, including air travel, lasting more than 2 hours for 1 week. ° °Avoid crowded public places for 1 week. ° °Adult visitors should try to avoid close contact with you for 1 week.   ° °Children and pregnant females should not visit or have close contact with you for 1 week. ° °Items that you touch are not radioactive. ° °Do not sleep in the same bed as your partner for 1 week, and a condom should be used for sexual activity during the first 24 hours. ° °Your blood may be radioactive and caution should be used if any bleeding occurs during the recovery period. ° °Body fluids may be radioactive for 24 hours.  Wash your hands after voiding.  Men should sit to urinate.  Dispose of any soiled materials (flush down toilet or place in trash at home) during the first day. ° °Drink 6 to 8 glasses of fluids per day for 5 days to hydrate yourself. ° °If you need to see a doctor during the first week, you must let them know that you were treated with yttrium-90 microspheres, and will be slightly radioactive.  They can call Interventional Radiology [phone] with any questions. ° ° ° ° °Conscious Sedation °Sedation is the use of medicines to promote relaxation and relieve discomfort and anxiety. Conscious sedation is a type of sedation. Under conscious sedation you are less alert than normal but are still able to respond to instructions or stimulation. Conscious sedation is used during short medical and dental procedures. It is milder than deep sedation or general anesthesia and allows you to return to your regular activities sooner.  °LET YOUR HEALTH CARE PROVIDER KNOW ABOUT:  °· Any allergies you have. °· All medicines you are taking, including vitamins, herbs, eye drops,  creams, and over-the-counter medicines. °· Use of steroids (by mouth or creams). °· Previous problems you or members of your family have had with the use of anesthetics. °· Any blood disorders you have. °· Previous surgeries you have had. °· Medical conditions you have. °· Possibility of pregnancy, if this applies. °· Use of cigarettes, alcohol, or illegal drugs. °RISKS AND COMPLICATIONS °Generally, this is a safe procedure. However, as with any procedure, problems can occur. Possible problems include: °· Oversedation. °· Trouble breathing on your own. You may need to have a breathing tube until you are awake and breathing on your own. °· Allergic reaction to any of the medicines used for the procedure. °BEFORE THE PROCEDURE °· You may have blood tests done. These tests can help show how well your kidneys and liver are working. They can also show how well your blood clots. °· A physical exam will be done.   °· Only take medicines as directed by your health care provider. You may need to stop taking medicines (such as blood thinners, aspirin, or nonsteroidal anti-inflammatory drugs) before the procedure.   °· Do not eat or drink at least 6 hours before the procedure or as directed by your health care provider. °· Arrange for a responsible adult, family member, or friend to take you home after the procedure. He or she should stay with you for at least 24 hours after the procedure, until the medicine has worn off. °PROCEDURE  °· An intravenous (IV) catheter   will be inserted into one of your veins. Medicine will be able to flow directly into your body through this catheter. You may be given medicine through this tube to help prevent pain and help you relax. °· The medical or dental procedure will be done. °AFTER THE PROCEDURE °· You will stay in a recovery area until the medicine has worn off. Your blood pressure and pulse will be checked.   °·  Depending on the procedure you had, you may be allowed to go home when you  can tolerate liquids and your pain is under control. °Document Released: 12/14/2000 Document Revised: 03/26/2013 Document Reviewed: 11/26/2012 °[employer]® Patient Information ©2015 [employer]. This information is not intended to replace advice given to you by your health care provider. Make sure you discuss any questions you have with your health care provider. ° °

## 2014-03-11 NOTE — Sedation Documentation (Signed)
Pt transport to Short Stay for recovery.

## 2014-03-11 NOTE — Sedation Documentation (Signed)
Gauze/tegaderm bandage applied to R fem art puncture, level 0, CDI, 4+RDP.

## 2014-03-11 NOTE — H&P (Signed)
Chief Complaint: "I'm here for another Y-90 treatment"  HPI: Ashley Davenport is an 80 y.o. female with metastatic colorectal cancer. She has liver mets and has had prior Y-90 radioembolization of the left lesion. She had PET scan which shows improvement on the left but mild increase in a right hepatic lobe lesion. She is now scheduled for Y-90 radioembolization of this right lesion. She has Davenport well since her office consult. No recent fevers, chills, illnesses. Has Davenport NPO this am  Past Medical History:  Past Medical History  Diagnosis Date  . AAA (abdominal aortic aneurysm) 2011  . Hyperlipidemia   . Hypertension   . Shingles     POST HERPATIC PAIN FROM SHINGLES  . Cancer     Rectal  . History of acute gouty arthritis   . Rectal cancer   . Kidney cysts   . Anemia     HX OF ANEMIA WITH CHEMO 2008  . Hypothyroidism     Past Surgical History:  Past Surgical History  Procedure Laterality Date  . Abdominal hysterectomy  1961    total  . Tonsillectomy  1949  . Bunionectomy Bilateral   . Rectal tumor removed  2008    CANCEROUS  . Abdominal aorta aneurysm repair -stent  2011  . Portacath placement N/A 09/24/2012    Procedure: INSERTION PORT-A-CATH;  Surgeon: Stark Klein, MD;  Location: [facility];  Service: General;  Laterality: N/A;  . Abdominal aortic aneurysm repair    . Hepatic/embolization arteriogram with y-90 radioembolization  10/21/13    Family History:  Family History  Problem Relation Age of Onset  . Hypertension Mother   . Heart disease Mother   . Heart attack Mother   . Hypertension Father   . Heart disease Father   . Diabetes Father   . Heart attack Father   . Cancer Sister     ovarian  . Diabetes Sister   . Hyperlipidemia Sister   . Hypertension Sister   . Kidney disease Sister   . Cancer Cousin     breast  . Cancer Cousin     breast  . Diabetes Daughter   . Hyperlipidemia Daughter   . Hypertension Daughter     Social History:  reports that  she quit smoking about 25 years ago. Her smoking use included Cigarettes. She has a 20 pack-year smoking history. She has never used smokeless tobacco. She reports that she drinks alcohol. She reports that she does not use illicit drugs.  Allergies:  Allergies  Allergen Reactions  . Shellfish Allergy Itching and Rash    Feels itching internally.  . Tramadol Hcl Swelling    REACTION: Tongue and lips swell  . Iodine Itching  . Meperidine Hcl Other (See Comments)    REACTION: Hypotension    Medications:   Medication List    ASK your doctor about these medications        allopurinol 300 MG tablet  Commonly known as:  ZYLOPRIM  Take 150 mg by mouth every morning.     amLODipine 5 MG tablet  Commonly known as:  NORVASC  Take 5 mg by mouth every morning.     Biotin 5000 MCG Caps  Take 5,000 mcg by mouth every morning.     capecitabine 500 MG tablet  Commonly known as:  XELODA  Take 4 tablets (2,000 mg total) by mouth 2 (two) times daily after a meal.     CoQ-10 200 MG Caps  Take 200 mg by  mouth at bedtime.     ferrous sulfate 325 (65 FE) MG tablet  Take 325 mg by mouth daily with breakfast.     furosemide 20 MG tablet  Commonly known as:  LASIX  Take 20 mg by mouth every morning.     irbesartan 300 MG tablet  Commonly known as:  AVAPRO  Take 300 mg by mouth every morning.     levothyroxine 75 MCG tablet  Commonly known as:  SYNTHROID, LEVOTHROID  Take 75 mcg by mouth every morning.     lidocaine-prilocaine cream  Commonly known as:  EMLA  Apply 1 application topically daily as needed (Applies to port-a-cath.).     ondansetron 8 MG tablet  Commonly known as:  ZOFRAN  Take 8 mg by mouth every 8 (eight) hours as needed for nausea or vomiting.     polyvinyl alcohol 1.4 % ophthalmic solution  Commonly known as:  LIQUIFILM TEARS  Place 1 drop into both eyes 4 (four) times daily.     rosuvastatin 10 MG tablet  Commonly known as:  CRESTOR  Take 10 mg by mouth every  evening.        Please HPI for pertinent positives, otherwise complete 10 system ROS negative.  Physical Exam: BP 129/72 mmHg  Pulse 81  Temp(Src) 98.3 F (36.8 C) (Oral)  Resp 18  Ht 5' 4"  (1.626 m)  Wt 170 lb (77.111 kg)  BMI 29.17 kg/m2  SpO2 100% Body mass index is 29.17 kg/(m^2).   General Appearance:  Alert, cooperative, no distress, appears stated age  Head:  Normocephalic, without obvious abnormality, atraumatic  ENT: Unremarkable  Neck: Supple, symmetrical, trachea midline  Lungs:   Clear to auscultation bilaterally, no w/r/r, respirations unlabored without use of accessory muscles.  Chest Wall:  No tenderness or deformity  Heart:  Regular rate and rhythm, S1, S2 normal, no murmur, rub or gallop.  Abdomen:   Soft, non-tender, non distended.  Extremities: Extremities normal, atraumatic, no cyanosis or edema  Pulses: 2+ and symmetric femoral  Neurologic: Normal affect, no gross deficits.  Labs: Results for orders placed or performed during the hospital encounter of 03/11/14 (from the past 48 hour(s))  Basic metabolic panel     Status: Abnormal   Collection Time: 03/11/14  7:55 AM  Result Value Ref Range   Sodium 136 (L) 137 - 147 mEq/L   Potassium 4.3 3.7 - 5.3 mEq/L   Chloride 101 96 - 112 mEq/L   CO2 20 19 - 32 mEq/L   Glucose, Bld 118 (H) 70 - 99 mg/dL   BUN 47 (H) 6 - 23 mg/dL   Creatinine, Ser 2.47 (H) 0.50 - 1.10 mg/dL   Calcium 9.5 8.4 - 10.5 mg/dL   GFR calc non Af Amer 17 (L) >90 mL/min   GFR calc Af Amer 20 (L) >90 mL/min    Comment: (NOTE) The eGFR has Davenport calculated using the CKD EPI equation. This calculation has not Davenport validated in all clinical situations. eGFR's persistently <90 mL/min signify possible Chronic Kidney Disease.    Anion gap 15 5 - 15  Protime-INR     Status: None   Collection Time: 03/11/14  7:55 AM  Result Value Ref Range   Prothrombin Time 13.5 11.6 - 15.2 seconds   INR 1.02 0.00 - 1.49   CBC    Component Value  Date/Time   WBC 6.7 03/07/2014 1404   RBC 3.16* 03/07/2014 1404   HGB 10.5* 03/07/2014 1404   HCT 32.2*  03/07/2014 1404   PLT 391 03/07/2014 1404   Hepatic Function Panel     Component Value Date/Time   PROT 7.4 03/07/2014 1403   ALBUMIN 3.4* 03/07/2014 1403   AST 26 03/07/2014 1403   ALT 13 03/07/2014 1403   ALKPHOS 89 03/07/2014 1403   BILITOT 0.41 03/07/2014 1403      Imaging: No results found.  Assessment/Plan Metastatic colorectal cancer with mets to left and right hepatic lobes. For Y-90 radioembolization of the right hepatic lesion today Explained procedure, risks, complications. Pre-meds have Davenport ordered and some already given Labs reviewed, ok Consent signed in chart  Ascencion Dike PA-C 03/11/2014, 9:02 AM

## 2014-03-11 NOTE — Sedation Documentation (Signed)
5Fr sheath removed from R femoral artery by Dr. Kathlene Cote.  Hemostasis achieved using Exoseal device.  R groin level 0, 4+ RDP.

## 2014-03-14 ENCOUNTER — Other Ambulatory Visit: Payer: Self-pay | Admitting: Interventional Radiology

## 2014-03-14 DIAGNOSIS — C189 Malignant neoplasm of colon, unspecified: Secondary | ICD-10-CM

## 2014-03-14 DIAGNOSIS — C787 Secondary malignant neoplasm of liver and intrahepatic bile duct: Principal | ICD-10-CM

## 2014-03-18 ENCOUNTER — Ambulatory Visit (HOSPITAL_BASED_OUTPATIENT_CLINIC_OR_DEPARTMENT_OTHER): Payer: Medicare Other | Admitting: Physician Assistant

## 2014-03-18 ENCOUNTER — Encounter: Payer: Self-pay | Admitting: Physician Assistant

## 2014-03-18 ENCOUNTER — Other Ambulatory Visit (HOSPITAL_BASED_OUTPATIENT_CLINIC_OR_DEPARTMENT_OTHER): Payer: Medicare Other

## 2014-03-18 ENCOUNTER — Telehealth: Payer: Self-pay | Admitting: Physician Assistant

## 2014-03-18 ENCOUNTER — Other Ambulatory Visit: Payer: Self-pay | Admitting: Oncology

## 2014-03-18 VITALS — BP 133/67 | HR 66 | Temp 98.8°F | Resp 18 | Ht 64.0 in | Wt 172.0 lb

## 2014-03-18 DIAGNOSIS — C20 Malignant neoplasm of rectum: Secondary | ICD-10-CM

## 2014-03-18 DIAGNOSIS — C787 Secondary malignant neoplasm of liver and intrahepatic bile duct: Secondary | ICD-10-CM

## 2014-03-18 DIAGNOSIS — C189 Malignant neoplasm of colon, unspecified: Secondary | ICD-10-CM

## 2014-03-18 DIAGNOSIS — I10 Essential (primary) hypertension: Secondary | ICD-10-CM

## 2014-03-18 DIAGNOSIS — D649 Anemia, unspecified: Secondary | ICD-10-CM

## 2014-03-18 LAB — COMPREHENSIVE METABOLIC PANEL (CC13)
ALBUMIN: 3.2 g/dL — AB (ref 3.5–5.0)
ALK PHOS: 73 U/L (ref 40–150)
ALT: 15 U/L (ref 0–55)
AST: 34 U/L (ref 5–34)
Anion Gap: 12 mEq/L — ABNORMAL HIGH (ref 3–11)
BILIRUBIN TOTAL: 0.48 mg/dL (ref 0.20–1.20)
BUN: 38.3 mg/dL — AB (ref 7.0–26.0)
CO2: 20 mEq/L — ABNORMAL LOW (ref 22–29)
Calcium: 9.3 mg/dL (ref 8.4–10.4)
Chloride: 103 mEq/L (ref 98–109)
Creatinine: 2.4 mg/dL — ABNORMAL HIGH (ref 0.6–1.1)
EGFR: 21 mL/min/{1.73_m2} — ABNORMAL LOW (ref >90)
Glucose: 130 mg/dl (ref 70–140)
POTASSIUM: 3.9 meq/L (ref 3.5–5.1)
Sodium: 135 mEq/L — ABNORMAL LOW (ref 136–145)
Total Protein: 6.7 g/dL (ref 6.4–8.3)

## 2014-03-18 LAB — CBC WITH DIFFERENTIAL/PLATELET
BASO%: 0.4 % (ref 0.0–2.0)
BASOS ABS: 0 10*3/uL (ref 0.0–0.1)
EOS%: 3.5 % (ref 0.0–7.0)
Eosinophils Absolute: 0.2 10*3/uL (ref 0.0–0.5)
HCT: 30.9 % — ABNORMAL LOW (ref 34.8–46.6)
HGB: 10 g/dL — ABNORMAL LOW (ref 11.6–15.9)
LYMPH%: 3.5 % — AB (ref 14.0–49.7)
MCH: 33.3 pg (ref 25.1–34.0)
MCHC: 32.3 g/dL (ref 31.5–36.0)
MCV: 103 fL — ABNORMAL HIGH (ref 79.5–101.0)
MONO#: 0.5 10*3/uL (ref 0.1–0.9)
MONO%: 8.1 % (ref 0.0–14.0)
NEUT#: 4.8 10*3/uL (ref 1.5–6.5)
NEUT%: 84.5 % — ABNORMAL HIGH (ref 38.4–76.8)
Platelets: 359 10*3/uL (ref 145–400)
RBC: 3 10*6/uL — ABNORMAL LOW (ref 3.70–5.45)
RDW: 18.5 % — AB (ref 11.2–14.5)
WBC: 5.7 10*3/uL (ref 3.9–10.3)
lymph#: 0.2 10*3/uL — ABNORMAL LOW (ref 0.9–3.3)

## 2014-03-18 NOTE — Progress Notes (Signed)
Hematology and Oncology Follow Up Visit  Ashley Davenport [id number] 03-25-1931 80 y.o. 03/18/2014 1:34 PM [name], MDClark, Don Broach, MD   Principle Diagnosis: 80 year old woman with rectal cancer diagnosed  on  09/01/2006. She hadT3 N0 moderately differentiated adenocarcinoma of the rectum with evidence of LV invasion. Now she has stage IV disease with liver mets.    Prior Therapy: She is status post neoadjuvant continuous infusion 5-FU with external radiation therapy between July 10, 2006 and Sep 01, 2006.  She declined abdominoperineal resection. She went on to receive additional 3 cycles of Xeloda, but developed hand-foot syndrome and declined further therapy.  She received FOLFIRI + Avastin. First cycle given on 09/28/12 with FOLFIRI alone. Cycle 4 was delayed due to grade 2 fatigue and grade 2-3 diarrhea. She is status post 12 cycles completed in December of 2014.  She is status post liver directed therapy in the form of Y-90 SIR sphere radioembolization. This was done on 10/22/2013  Current therapy: She started maintenance chemotherapy with oral Xeloda in 05/2013. She receives 1000 mg BID 2 weeks on and 1 week off.   Interim History: Ashley Davenport presents today for a follow up visit. Since the last visit, she has no new complaints. She continues to be on Xeloda without any issues except for some dryness around the periphery of her palms.  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 This predominantly grade 1 sensory without any interference with her activities of daily living. Appetite is good and her weight is stable. She is reporting no hand foot syndrome, without increased pain and no skin cracking or desquamation. No nausea. Vomiting, or diarrhea. No chest pain, shortness of breath, or dyspnea. She continued to have excellent quality of life and have regained most activities of daily living. She has not reported any genitourinary or  gastrointestinal symptoms. She has not reported any neurological symptoms or cardiovascular symptoms. She has not reported any chest pain difficulty breathing cough or hemoptysis or hematemesis. She has not reported any lymphadenopathy or petechiae. She reports that she is status post another Y 90 procedure on 03/11/2014 under the care of Dr. Kathlene Cote.She tolerated this second procedure without difficulty. The remainder of review of systems was unremarkable.  Medications: I have reviewed the patient's current medications.     Allergies:  Allergies  Allergen Reactions  . Shellfish Allergy Itching and Rash    Feels itching internally.  . Tramadol Hcl Swelling    REACTION: Tongue and lips swell  . Iodine Itching  . Meperidine Hcl Other (See Comments)    REACTION: Hypotension      Physical Exam: Blood pressure 133/67, pulse 66, temperature 98.8 F (37.1 C), temperature source Oral, resp. rate 18, height 5\' 4"  (1.626 m), weight 172 lb (78.019 kg). ECOG: 1 General appearance: alert awake not in any distress. Head: Normocephalic, no lesions. Neck: no adenopathy, no thyromegaly. Lymph nodes: Cervical, supraclavicular, and axillary nodes normal. Heart:regular rate and rhythm, S1, S2 normal, no murmur, click, rub or gallop.  Lung:chest clear, no wheezing, rales, normal symmetric air entry Abdomen: soft, non-tender, without masses or organomegaly EXT: I cannot appreciate any erythema or induration.   Lab Results: Lab Results  Component Value Date   WBC 5.7 03/18/2014   HGB 10.0* 03/18/2014   HCT 30.9* 03/18/2014   MCV 103.0* 03/18/2014   PLT 359 03/18/2014     Chemistry      Component Value Date/Time   NA 135*  03/18/2014 0823   NA 136* 03/11/2014 0755   K 3.9 03/18/2014 0823   K 4.3 03/11/2014 0755   CL 101 03/11/2014 0755   CL 102 09/25/2012 0933   CO2 20* 03/18/2014 0823   CO2 20 03/11/2014 0755   BUN 38.3* 03/18/2014 0823   BUN 47* 03/11/2014 0755   CREATININE 2.4*  03/18/2014 0823   CREATININE 2.47* 03/11/2014 0755      Component Value Date/Time   CALCIUM 9.3 03/18/2014 0823   CALCIUM 9.5 03/11/2014 0755   ALKPHOS 73 03/18/2014 0823   ALKPHOS 90 03/03/2014 1422   AST 34 03/18/2014 0823   AST 29 03/03/2014 1422   ALT 15 03/18/2014 0823   ALT 10 03/03/2014 1422   BILITOT 0.48 03/18/2014 0823   BILITOT 0.4 03/03/2014 1422      EXAM:  NUCLEAR MEDICINE PET SKULL BASE TO THIGH  TECHNIQUE:  8.6 mCi F-18 FDG was injected intravenously. Full-ring PET imaging  was performed from the skull base to thigh after the radiotracer. CT  data was obtained and used for attenuation correction and anatomic  localization.  FASTING BLOOD GLUCOSE: Value: 110 mg/dl  COMPARISON: 09/21/12  FINDINGS:  NECK  No hypermetabolic lymph nodes in the neck.  CHEST  No hypermetabolic mediastinal or hilar nodes. No suspicious  pulmonary nodules on the CT scan.  ABDOMEN/PELVIS  Dominant hepatic metastasis centered in the medial segment left lobe  shows interval decrease in size and hypermetabolic activity since  previous study. This now has an SUV max of 19.0 compared to 28.9  previously.  A small hypermetabolic metastasis in the dome of the right hepatic  lobe shows mild increase in size and hypermetabolic activity  compared to prior exam. This now has an SUV max of 12.4 compared to  7.5 previously. No new hypermetabolic liver metastases identified.  No hypermetabolic lymph nodes in the abdomen or pelvis. Stable  appearance of stent graft repair of abdominal aortic aneurysm.  SKELETON  No focal hypermetabolic activity to suggest skeletal metastasis.  IMPRESSION:  Mixed response with improvement in dominant liver metastasis in the  left hepatic lobe, and mild increase in smaller right hepatic lobe  metastasis compared to prior exam.  No other sites of metastatic disease identified.    Impression and Plan:  Ashley Davenport is a 80 year old woman with:  1. T3 N0  moderately differentiated adenocarcinoma of the rectum with evidence of LV invasion and positive surgical margins 05/30/2006. She has metastatic disease to the liver and status post chemotherapy. She is currently on maintenance Xeloda. She is status post Y-90 SIR sphere radioembolization. This was done on 10/22/2013. She is status post Y 90 SIR sphere radioembolization on 03/11/2014 under the care Dr. Kathlene Cote.  Her last PET CT scan on 12/18/2013 and showed mixed response with slight increase in the right hepatic lobe lesion.  Given the bulk of her disease, Dr. Alen Blew would like to defer systemic chemotherapy to a later date and time.   2. Anemia: S/P Feraheme. Anemia is multifactorial in nature we'll continue to monitor.  3. Nausea and vomiting prophylaxis: On Zofran and compazine. This is a no longer an issue  4. HTN: She is on amlodipine and valsartan.   5. Port-A-Cath management: This will be flushed regularly every 6 weeks- next due 04/29/2014  6. Followup: In 6 weeks with Dr. Alen Blew on 04/29/2014 with a labs from her [place], [place], PA-C 12/15/20151:34 PM

## 2014-03-18 NOTE — Telephone Encounter (Signed)
Gave avs & cal for Jan. °

## 2014-03-18 NOTE — Patient Instructions (Signed)
Continue your Xeloda as prescribed Follow-up in 6 weeks

## 2014-04-08 ENCOUNTER — Other Ambulatory Visit: Payer: Self-pay | Admitting: *Deleted

## 2014-04-08 NOTE — Telephone Encounter (Signed)
THIS REFILL REQUEST FOR CAPECITABINE WAS PLACED IN DR.SHADAD'S ACTIVE WORK FOLDER. 

## 2014-04-09 ENCOUNTER — Other Ambulatory Visit: Payer: Self-pay | Admitting: *Deleted

## 2014-04-09 DIAGNOSIS — C20 Malignant neoplasm of rectum: Secondary | ICD-10-CM

## 2014-04-09 DIAGNOSIS — C189 Malignant neoplasm of colon, unspecified: Secondary | ICD-10-CM

## 2014-04-09 DIAGNOSIS — C787 Secondary malignant neoplasm of liver and intrahepatic bile duct: Secondary | ICD-10-CM

## 2014-04-09 MED ORDER — CAPECITABINE 500 MG PO TABS: 1000.0000 mg/m2 | ORAL_TABLET | Freq: Two times a day (BID) | ORAL | Status: DC

## 2014-04-11 NOTE — Telephone Encounter (Signed)
RECEIVED A FAX FROM [facility] STATING THAT PT. WOULD LIKE TO OBTAIN HIS CAPECITABINE FROM [facility]. PRESCRIPTION WAS FAXED TO [facility].

## 2014-04-29 ENCOUNTER — Telehealth: Payer: Self-pay | Admitting: Oncology

## 2014-04-29 ENCOUNTER — Other Ambulatory Visit (HOSPITAL_BASED_OUTPATIENT_CLINIC_OR_DEPARTMENT_OTHER): Payer: Medicare Other

## 2014-04-29 ENCOUNTER — Ambulatory Visit (HOSPITAL_BASED_OUTPATIENT_CLINIC_OR_DEPARTMENT_OTHER): Payer: Medicare Other | Admitting: Oncology

## 2014-04-29 VITALS — BP 125/70 | HR 90 | Temp 98.1°F | Resp 18 | Ht 64.0 in | Wt 169.4 lb

## 2014-04-29 DIAGNOSIS — C20 Malignant neoplasm of rectum: Secondary | ICD-10-CM

## 2014-04-29 DIAGNOSIS — C189 Malignant neoplasm of colon, unspecified: Secondary | ICD-10-CM

## 2014-04-29 DIAGNOSIS — I10 Essential (primary) hypertension: Secondary | ICD-10-CM

## 2014-04-29 DIAGNOSIS — C787 Secondary malignant neoplasm of liver and intrahepatic bile duct: Secondary | ICD-10-CM

## 2014-04-29 LAB — CBC WITH DIFFERENTIAL/PLATELET
BASO%: 0.7 % (ref 0.0–2.0)
BASOS ABS: 0 10*3/uL (ref 0.0–0.1)
EOS%: 2.4 % (ref 0.0–7.0)
Eosinophils Absolute: 0.1 10*3/uL (ref 0.0–0.5)
HEMATOCRIT: 30.7 % — AB (ref 34.8–46.6)
HEMOGLOBIN: 9.8 g/dL — AB (ref 11.6–15.9)
LYMPH%: 7.9 % — ABNORMAL LOW (ref 14.0–49.7)
MCH: 34 pg (ref 25.1–34.0)
MCHC: 31.8 g/dL (ref 31.5–36.0)
MCV: 106.6 fL — ABNORMAL HIGH (ref 79.5–101.0)
MONO#: 0.6 10*3/uL (ref 0.1–0.9)
MONO%: 10.1 % (ref 0.0–14.0)
NEUT#: 4.7 10*3/uL (ref 1.5–6.5)
NEUT%: 78.9 % — ABNORMAL HIGH (ref 38.4–76.8)
PLATELETS: 303 10*3/uL (ref 145–400)
RBC: 2.88 10*6/uL — AB (ref 3.70–5.45)
RDW: 19.5 % — AB (ref 11.2–14.5)
WBC: 5.9 10*3/uL (ref 3.9–10.3)
lymph#: 0.5 10*3/uL — ABNORMAL LOW (ref 0.9–3.3)

## 2014-04-29 LAB — COMPREHENSIVE METABOLIC PANEL (CC13)
ALBUMIN: 3.5 g/dL (ref 3.5–5.0)
ALT: 18 U/L (ref 0–55)
AST: 36 U/L — AB (ref 5–34)
Alkaline Phosphatase: 91 U/L (ref 40–150)
Anion Gap: 11 mEq/L (ref 3–11)
BILIRUBIN TOTAL: 0.71 mg/dL (ref 0.20–1.20)
BUN: 24.9 mg/dL (ref 7.0–26.0)
CO2: 21 meq/L — AB (ref 22–29)
CREATININE: 2.3 mg/dL — AB (ref 0.6–1.1)
Calcium: 9.3 mg/dL (ref 8.4–10.4)
Chloride: 108 mEq/L (ref 98–109)
EGFR: 22 mL/min/{1.73_m2} — AB (ref >90)
Glucose: 118 mg/dl (ref 70–140)
POTASSIUM: 4.5 meq/L (ref 3.5–5.1)
Sodium: 140 mEq/L (ref 136–145)
TOTAL PROTEIN: 6.9 g/dL (ref 6.4–8.3)

## 2014-04-29 NOTE — Progress Notes (Signed)
Hematology and Oncology Follow Up Visit  Ashley Davenport [id number] 1930/04/22 81 y.o. 04/29/2014 10:15 AM Ashley Davenport, MDClark, Don Broach, MD   Principle Diagnosis: 81 year old woman with rectal cancer diagnosed  on  09/01/2006. She hadT3 N0 moderately differentiated adenocarcinoma of the rectum with evidence of LV invasion. Now she has stage IV disease with liver mets.    Prior Therapy: She is status post neoadjuvant continuous infusion 5-FU with external radiation therapy between July 10, 2006 and Sep 01, 2006.  She declined abdominoperineal resection. She went on to receive additional 3 cycles of Xeloda, but developed hand-foot syndrome and declined further therapy.  She received FOLFIRI + Avastin. First cycle given on 09/28/12 with FOLFIRI alone. Cycle 4 was delayed due to grade 2 fatigue and grade 2-3 diarrhea. She is status post 12 cycles completed in December of 2014.  She is status post liver directed therapy in the form of Y-90 SIR sphere radioembolization. This was done on 10/22/2013. She is status post liver directed therapy as well as in December 2015.  Current therapy: She started maintenance chemotherapy with oral Xeloda in 05/2013. She receives 1000 mg BID 2 weeks on and 1 week off.   Interim History: Ashley Davenport presents today for a follow up visit. Since the last visit, she underwent a repeat her liver directed therapy in December 2015. She tolerated it well without any issues. She is reporting slight increase in her hand and feet irritation. She is reporting predominantly slight irritation, pruritus without erythema in her palms. 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 This predominantly grade 1 sensory without any interference with her activities of daily living. Appetite is good and her weight is stable. She is not reporting any skin cracking or desquamation. No nausea. Vomiting, or diarrhea. No chest pain, shortness of breath,  or dyspnea. She continued to have excellent quality of life and have regained most activities of daily living. She has not reported any genitourinary or gastrointestinal symptoms. She has not reported any neurological symptoms or cardiovascular symptoms. She has not reported any chest pain difficulty breathing cough or hemoptysis or hematemesis. She has not reported any lymphadenopathy or petechiae. .She tolerated this second procedure without difficulty. The remainder of review of systems was unremarkable.  Medications: I have reviewed the patient's current medications.     Allergies:  Allergies  Allergen Reactions  . Shellfish Allergy Itching and Rash    Feels itching internally.  . Tramadol Hcl Swelling    REACTION: Tongue and lips swell  . Iodine Itching  . Meperidine Hcl Other (See Comments)    REACTION: Hypotension      Physical Exam: Blood pressure 125/70, pulse 90, temperature 98.1 F (36.7 C), temperature source Oral, resp. rate 18, height 5\' 4"  (1.626 m), weight 169 lb 6.4 oz (76.839 kg), SpO2 100 %. ECOG: 1 General appearance: alert awake not in any distress. Head: Normocephalic, no lesions. Neck: no adenopathy, no thyromegaly. Lymph nodes: Cervical, supraclavicular, and axillary nodes normal. Heart:regular rate and rhythm, S1, S2 normal, no murmur, click, rub or gallop.  Lung:chest clear, no wheezing, rales, normal symmetric air entry Abdomen: soft, non-tender, without masses or organomegaly EXT: She has increased irritation and dryness in her palms and soles of the feet.   Lab Results: Lab Results  Component Value Date   WBC 5.9 04/29/2014   HGB 9.8* 04/29/2014   HCT 30.7* 04/29/2014   MCV 106.6* 04/29/2014   PLT 303 04/29/2014  Chemistry      Component Value Date/Time   NA 140 04/29/2014 0937   NA 136* 03/11/2014 0755   K 4.5 04/29/2014 0937   K 4.3 03/11/2014 0755   CL 101 03/11/2014 0755   CL 102 09/25/2012 0933   CO2 21* 04/29/2014 0937   CO2  20 03/11/2014 0755   BUN 24.9 04/29/2014 0937   BUN 47* 03/11/2014 0755   CREATININE 2.3* 04/29/2014 0937   CREATININE 2.47* 03/11/2014 0755      Component Value Date/Time   CALCIUM 9.3 04/29/2014 0937   CALCIUM 9.5 03/11/2014 0755   ALKPHOS 91 04/29/2014 0937   ALKPHOS 90 03/03/2014 1422   AST 36* 04/29/2014 0937   AST 29 03/03/2014 1422   ALT 18 04/29/2014 0937   ALT 10 03/03/2014 1422   BILITOT 0.71 04/29/2014 0937   BILITOT 0.4 03/03/2014 1422      Impression and Plan:  Ashley Davenport is a 81 year old woman with:  1. T3 N0 moderately differentiated adenocarcinoma of the rectum with evidence of LV invasion and positive surgical margins 05/30/2006. She has metastatic disease to the liver and status post chemotherapy. She is currently on maintenance Xeloda.  She is reporting more complications associated with the current dose of Xeloda. I've asked to reduce the dose to 500 mg of Xeloda twice a day with 14 days on and 1 week off.   Her last PET CT scan on 12/18/2013 and showed mixed response with slight increase in the right hepatic lobe lesion.  The plan is to repeat a PET scan in the next month for staging purposes. Her CEA level have increased in December 2015 possibly indicating relapsed disease.   2. Anemia: S/P Feraheme. Anemia is multifactorial in nature we'll continue to monitor.  3. Liver metastasis: She is status post liver directed therapy on 2 separate occasions. She has repeat imaging studies for her liver in April 2016.  4. HTN: She is on amlodipine and valsartan.   5. Port-A-Cath management: This will be flushed regularly every 6 weeks.  6. Followup: In 4-5 weeks for repeat imaging studies in a follow-up visit.   [facility], MD 1/26/201610:15 AM

## 2014-04-29 NOTE — Telephone Encounter (Signed)
gv adn printed appt sched and avs for pt for Feb and march 2016

## 2014-04-29 NOTE — Telephone Encounter (Signed)
gv adn printed appt sched anda vs for pt for Feb °

## 2014-05-08 ENCOUNTER — Encounter: Payer: Self-pay | Admitting: Oncology

## 2014-05-08 NOTE — Progress Notes (Signed)
Received letter from [employer]. Pt is approved for Xeloda effective 05/02/14 to 05/02/15 or when benefit cap has been met.  The amount of the grant is $[id number].

## 2014-05-20 ENCOUNTER — Other Ambulatory Visit: Payer: Self-pay | Admitting: *Deleted

## 2014-05-20 DIAGNOSIS — C20 Malignant neoplasm of rectum: Secondary | ICD-10-CM

## 2014-05-20 DIAGNOSIS — C787 Secondary malignant neoplasm of liver and intrahepatic bile duct: Secondary | ICD-10-CM

## 2014-05-20 DIAGNOSIS — C189 Malignant neoplasm of colon, unspecified: Secondary | ICD-10-CM

## 2014-05-20 MED ORDER — CAPECITABINE 500 MG PO TABS
ORAL_TABLET | ORAL | Status: DC
Start: 2014-05-20 — End: 2014-06-25

## 2014-05-20 MED ORDER — CAPECITABINE 500 MG PO TABS: ORAL_TABLET | ORAL | Status: DC

## 2014-05-30 ENCOUNTER — Other Ambulatory Visit (HOSPITAL_BASED_OUTPATIENT_CLINIC_OR_DEPARTMENT_OTHER): Payer: Medicare Other

## 2014-05-30 ENCOUNTER — Ambulatory Visit (HOSPITAL_BASED_OUTPATIENT_CLINIC_OR_DEPARTMENT_OTHER): Payer: Medicare Other

## 2014-05-30 ENCOUNTER — Ambulatory Visit (HOSPITAL_COMMUNITY)
Admission: RE | Admit: 2014-05-30 | Discharge: 2014-05-30 | Disposition: A | Discharge status: Home / Self Care | Payer: Medicare Other | Source: Ambulatory Visit | Attending: Oncology | Admitting: Oncology

## 2014-05-30 VITALS — BP 139/65 | HR 74 | Temp 97.8°F

## 2014-05-30 DIAGNOSIS — Z95828 Presence of other vascular implants and grafts: Secondary | ICD-10-CM

## 2014-05-30 DIAGNOSIS — C20 Malignant neoplasm of rectum: Secondary | ICD-10-CM

## 2014-05-30 DIAGNOSIS — Z87891 Personal history of nicotine dependence: Secondary | ICD-10-CM | POA: Diagnosis not present

## 2014-05-30 DIAGNOSIS — C189 Malignant neoplasm of colon, unspecified: Secondary | ICD-10-CM

## 2014-05-30 DIAGNOSIS — C787 Secondary malignant neoplasm of liver and intrahepatic bile duct: Secondary | ICD-10-CM | POA: Diagnosis not present

## 2014-05-30 DIAGNOSIS — Z79899 Other long term (current) drug therapy: Secondary | ICD-10-CM | POA: Diagnosis not present

## 2014-05-30 LAB — COMPREHENSIVE METABOLIC PANEL (CC13)
ALBUMIN: 3.1 g/dL — AB (ref 3.5–5.0)
ALT: 14 U/L (ref 0–55)
AST: 47 U/L — AB (ref 5–34)
Alkaline Phosphatase: 130 U/L (ref 40–150)
Anion Gap: 12 mEq/L — ABNORMAL HIGH (ref 3–11)
BUN: 27 mg/dL — AB (ref 7.0–26.0)
CO2: 18 mEq/L — ABNORMAL LOW (ref 22–29)
CREATININE: 1.9 mg/dL — AB (ref 0.6–1.1)
Calcium: 9.5 mg/dL (ref 8.4–10.4)
Chloride: 110 mEq/L — ABNORMAL HIGH (ref 98–109)
EGFR: 28 mL/min/{1.73_m2} — ABNORMAL LOW (ref >90)
GLUCOSE: 103 mg/dL (ref 70–140)
Potassium: 4 mEq/L (ref 3.5–5.1)
SODIUM: 141 meq/L (ref 136–145)
Total Bilirubin: 0.51 mg/dL (ref 0.20–1.20)
Total Protein: 6.8 g/dL (ref 6.4–8.3)

## 2014-05-30 LAB — CBC WITH DIFFERENTIAL/PLATELET
BASO%: 0.7 % (ref 0.0–2.0)
Basophils Absolute: 0 10*3/uL (ref 0.0–0.1)
EOS%: 4.8 % (ref 0.0–7.0)
Eosinophils Absolute: 0.3 10*3/uL (ref 0.0–0.5)
HCT: 28.6 % — ABNORMAL LOW (ref 34.8–46.6)
HGB: 9.3 g/dL — ABNORMAL LOW (ref 11.6–15.9)
LYMPH%: 8.9 % — AB (ref 14.0–49.7)
MCH: 33.5 pg (ref 25.1–34.0)
MCHC: 32.7 g/dL (ref 31.5–36.0)
MCV: 102.6 fL — ABNORMAL HIGH (ref 79.5–101.0)
MONO#: 0.5 10*3/uL (ref 0.1–0.9)
MONO%: 7.5 % (ref 0.0–14.0)
NEUT#: 5.1 10*3/uL (ref 1.5–6.5)
NEUT%: 78.1 % — AB (ref 38.4–76.8)
Platelets: 307 10*3/uL (ref 145–400)
RBC: 2.79 10*6/uL — ABNORMAL LOW (ref 3.70–5.45)
RDW: 18.6 % — AB (ref 11.2–14.5)
WBC: 6.5 10*3/uL (ref 3.9–10.3)
lymph#: 0.6 10*3/uL — ABNORMAL LOW (ref 0.9–3.3)

## 2014-05-30 LAB — GLUCOSE, CAPILLARY: Glucose-Capillary: 98 mg/dL (ref 70–99)

## 2014-05-30 LAB — CEA: CEA: 58.8 ng/mL — AB (ref 0.0–5.0)

## 2014-05-30 MED ORDER — FLUDEOXYGLUCOSE F - 18 (FDG) INJECTION
8.8000 | Freq: Once | INTRAVENOUS | Status: AC | PRN
  Administered 2014-05-30: 8.8 via INTRAVENOUS

## 2014-05-30 MED ORDER — SODIUM CHLORIDE 0.9 % IJ SOLN
10.0000 mL | INTRAMUSCULAR | Status: DC | PRN
Start: 2014-05-30 — End: 2014-05-30
  Administered 2014-05-30: 10 mL via INTRAVENOUS
  Filled 2014-05-30: qty 10

## 2014-05-30 MED ORDER — HEPARIN SOD (PORK) LOCK FLUSH 100 UNIT/ML IV SOLN
500.0000 [IU] | Freq: Once | INTRAVENOUS | Status: AC
  Administered 2014-05-30: 500 [IU] via INTRAVENOUS
  Filled 2014-05-30: qty 5

## 2014-05-30 NOTE — Patient Instructions (Signed)

## 2014-06-04 ENCOUNTER — Ambulatory Visit (HOSPITAL_BASED_OUTPATIENT_CLINIC_OR_DEPARTMENT_OTHER): Payer: Medicare Other | Admitting: Oncology

## 2014-06-04 ENCOUNTER — Telehealth: Payer: Self-pay | Admitting: Oncology

## 2014-06-04 VITALS — BP 134/61 | HR 68 | Temp 98.0°F | Resp 18 | Ht 64.0 in | Wt 168.3 lb

## 2014-06-04 DIAGNOSIS — C20 Malignant neoplasm of rectum: Secondary | ICD-10-CM

## 2014-06-04 DIAGNOSIS — C787 Secondary malignant neoplasm of liver and intrahepatic bile duct: Secondary | ICD-10-CM

## 2014-06-04 DIAGNOSIS — D649 Anemia, unspecified: Secondary | ICD-10-CM

## 2014-06-04 DIAGNOSIS — D631 Anemia in chronic kidney disease: Secondary | ICD-10-CM

## 2014-06-04 DIAGNOSIS — N189 Chronic kidney disease, unspecified: Secondary | ICD-10-CM

## 2014-06-04 NOTE — Telephone Encounter (Signed)
gv and printed appt sched and avs for pt for April adn June

## 2014-06-04 NOTE — Progress Notes (Signed)
Hematology and Oncology Follow Up Visit  Ashley Davenport [id number] [birth year] 81 y.o. 06/04/2014 1:13 PM [name], MDClark, Don Broach, MD   Principle Diagnosis: 81 year old woman with rectal cancer diagnosed  on  09/01/2006. She hadT3 N0 moderately differentiated adenocarcinoma of the rectum with evidence of LV invasion. Now she has stage IV disease with liver mets.    Prior Therapy: She is status post neoadjuvant continuous infusion 5-FU with external radiation therapy between July 10, 2006 and Sep 01, 2006.  She declined abdominoperineal resection. She went on to receive additional 3 cycles of Xeloda, but developed hand-foot syndrome and declined further therapy.  She received FOLFIRI + Avastin. First cycle given on 09/28/12 with FOLFIRI alone. Cycle 4 was delayed due to grade 2 fatigue and grade 2-3 diarrhea. She is status post 12 cycles completed in December of 2014.  She is status post liver directed therapy in the form of Y-90 SIR sphere radioembolization. This was done on 10/22/2013. She is status post liver directed therapy as well as in December 2015.  Current therapy: She started maintenance chemotherapy with oral Xeloda in 05/2013. She receives 500 mg BID 2 weeks on and 1 week off.   Interim History: Ashley Davenport presents today for a follow up visit. Since the last visit, she continues to do relatively well. She is tolerating Xeloda without any new complications. She hand and feet irritation which is unchanged. She has not reported any increased erythema or desquamation. 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 This predominantly grade 1 sensory without any interference with her activities of daily living. She continues to report that her appetite is good and her weight is stable.   No nausea. Vomiting, or diarrhea. No chest pain, shortness of breath, or dyspnea. She continued to have excellent quality of life and have regained most  activities of daily living. She has not reported any genitourinary or gastrointestinal symptoms. She has not reported any neurological symptoms or cardiovascular symptoms. She has not reported any chest pain difficulty breathing cough or hemoptysis or hematemesis. She has not reported any lymphadenopathy or petechiae. .She tolerated this second procedure without difficulty. The remainder of review of systems was unremarkable.  Medications: I have reviewed the patient's current medications.     Allergies:  Allergies  Allergen Reactions  . Shellfish Allergy Itching and Rash    Feels itching internally.  . Tramadol Hcl Swelling    REACTION: Tongue and lips swell  . Iodine Itching  . Meperidine Hcl Other (See Comments)    REACTION: Hypotension      Physical Exam: Blood pressure 134/61, pulse 68, temperature 98 F (36.7 C), temperature source Oral, resp. rate 18, height 5\' 4"  (1.626 m), weight 168 lb 4.8 oz (76.34 kg), SpO2 99 %. ECOG: 1 General appearance: alert awake not in any distress. Appeared younger than stated age. Head: Normocephalic, no lesions. Neck: no adenopathy, no thyromegaly. Lymph nodes: Cervical, supraclavicular, and axillary nodes normal. Heart:regular rate and rhythm, S1, S2 normal, no murmur, click, rub or gallop.  Lung:chest clear, no wheezing, rales, normal symmetric air entry Abdomen: soft, non-tender, without masses or organomegaly EXT: Slight erythema noted in her palms. No desquamation noted.   Lab Results: Lab Results  Component Value Date   WBC 6.5 05/30/2014   HGB 9.3* 05/30/2014   HCT 28.6* 05/30/2014   MCV 102.6* 05/30/2014   PLT 307 05/30/2014     Chemistry      Component Value  Date/Time   NA 141 05/30/2014 0925   NA 136* 03/11/2014 0755   K 4.0 05/30/2014 0925   K 4.3 03/11/2014 0755   CL 101 03/11/2014 0755   CL 102 09/25/2012 0933   CO2 18* 05/30/2014 0925   CO2 20 03/11/2014 0755   BUN 27.0* 05/30/2014 0925   BUN 47* 03/11/2014  0755   CREATININE 1.9* 05/30/2014 0925   CREATININE 2.47* 03/11/2014 0755      Component Value Date/Time   CALCIUM 9.5 05/30/2014 0925   CALCIUM 9.5 03/11/2014 0755   ALKPHOS 130 05/30/2014 0925   ALKPHOS 90 03/03/2014 1422   AST 47* 05/30/2014 0925   AST 29 03/03/2014 1422   ALT 14 05/30/2014 0925   ALT 10 03/03/2014 1422   BILITOT 0.51 05/30/2014 0925   BILITOT 0.4 03/03/2014 1422      EXAM: NUCLEAR MEDICINE PET SKULL BASE TO THIGH  TECHNIQUE: 8.8 mCi F-18 FDG was injected intravenously. Full-ring PET imaging was performed from the skull base to thigh after the radiotracer. CT data was obtained and used for attenuation correction and anatomic localization.  FASTING BLOOD GLUCOSE: Value: Ninety-eight mg/dl  COMPARISON: PET of 12/18/2013  FINDINGS: NECK  No areas of abnormal hypermetabolism.  CHEST  No areas of abnormal hypermetabolism.  ABDOMEN/PELVIS  A mass centered in the medial segment left lobe of the liver is increased in size, but difficult to measure secondary to ill-defined appearance. Replaces the majority of the medial segment left lobe, measuring up to 7.6 x 7.9 cm. On the order of 5.1 x 5.9 cm on the prior exam (when remeasured). Hypermetabolic, heterogeneously so. Measures a S.U.V. max of 17.0. On the prior exam, this measured a S.U.V. max of 19.0.  A mass in the central right lobe of the liver measures 3.8 x 4.0 cm and a S.U.V. max of 9.5 on image 97. On the prior exam, this measured 2.5 x 2.1 cm and a S.U.V. max of 12.4.  Hypermetabolism in the porta hepatis is likely nodal. Suboptimally evaluated secondary to beam hardening artifact from embolization coils in this area. Measures a S.U.V. max of 7.7, including on image 105. New.  SKELETON  No abnormal marrow activity.  CT IMAGES PERFORMED FOR ATTENUATION CORRECTION  No cervical adenopathy. Bilateral carotid atherosclerosis.  Aortic and branch vessel  atherosclerosis. Mild cardiomegaly. Small hiatal hernia. 3 mm lingular nodule on image 29, not readily apparent on the prior exam.  A subpleural 3 mm left lower lobe nodule on image 34 is also not readily apparent on the prior.  3 mm right upper lobe nodule on image 19 of the similar on the prior.  Aortic stent graft repair. Normal adrenal glands. Right renal cysts. Focus of hyper attenuation in the interpolar right kidney is likely due to a complex cyst but technically indeterminate. Hepatic cysts. Extensive colonic diverticulosis. Trace cul-de-sac fluid is similar.  IMPRESSION: 1. Both hepatic metastasis demonstrate interval enlargement but decrease in hypermetabolism. 2. New porta hepatis hypermetabolism is likely nodal, but suboptimally evaluated on CT images secondary to beam hardening artifact from embolization coronal. 3. Small pulmonary nodules. Some are new. Indeterminate. Recommend attention on follow-up. Otherwise, no evidence of extra-abdominal metastatic disease. 4. Similar trace cul-de-sac fluid.    Impression and Plan:  81 year old woman with:  1. T3 N0 moderately differentiated adenocarcinoma of the rectum with evidence of LV invasion and positive surgical margins 05/30/2006. She has metastatic disease to the liver that was discovered in June 2014 and and status post chemotherapy  outlined above. She is currently on maintenance Xeloda.  PET scan was reviewed today and did not show any evidence of progression of disease that requires change of treatment. We will continue maintenance Xeloda at this time and switched to intravenous IV chemotherapy with clear signs of progression. I plan on repeating a PET scan in 6 months.  The plan is to continue oral Xeloda at 500 mg twice a day as a better tolerated dose.   2. Anemia: S/P Feraheme. Anemia is multifactorial in nature we'll continue to monitor. I will repeat iron studies with the next visit.  3. Liver  metastasis: She is status post liver directed therapy on 2 separate occasions. She has repeat imaging studies for her liver in April 2016.  4. HTN: She is on amlodipine and valsartan.   5. Port-A-Cath management: This will be flushed regularly every 6 weeks.  6. Followup: In in 6 weeks and in 3 months respectively.   Zola Button, MD 3/2/20161:13 PM

## 2014-06-25 ENCOUNTER — Other Ambulatory Visit: Payer: Self-pay | Admitting: *Deleted

## 2014-06-25 DIAGNOSIS — C189 Malignant neoplasm of colon, unspecified: Secondary | ICD-10-CM

## 2014-06-25 DIAGNOSIS — C20 Malignant neoplasm of rectum: Secondary | ICD-10-CM

## 2014-06-25 DIAGNOSIS — C787 Secondary malignant neoplasm of liver and intrahepatic bile duct: Secondary | ICD-10-CM

## 2014-06-25 MED ORDER — CAPECITABINE 500 MG PO TABS: ORAL_TABLET | ORAL | Status: DC

## 2014-07-22 ENCOUNTER — Ambulatory Visit: Payer: Medicare Other

## 2014-07-22 ENCOUNTER — Other Ambulatory Visit: Payer: Self-pay | Admitting: *Deleted

## 2014-07-22 ENCOUNTER — Telehealth: Payer: Self-pay | Admitting: Physician Assistant

## 2014-07-22 ENCOUNTER — Encounter: Payer: Self-pay | Admitting: Physician Assistant

## 2014-07-22 ENCOUNTER — Ambulatory Visit (HOSPITAL_BASED_OUTPATIENT_CLINIC_OR_DEPARTMENT_OTHER): Payer: Medicare Other | Admitting: Physician Assistant

## 2014-07-22 ENCOUNTER — Other Ambulatory Visit (HOSPITAL_BASED_OUTPATIENT_CLINIC_OR_DEPARTMENT_OTHER): Payer: Medicare Other

## 2014-07-22 VITALS — BP 139/69 | HR 70 | Temp 98.1°F | Resp 18 | Ht 64.0 in | Wt 162.8 lb

## 2014-07-22 DIAGNOSIS — C189 Malignant neoplasm of colon, unspecified: Secondary | ICD-10-CM

## 2014-07-22 DIAGNOSIS — N189 Chronic kidney disease, unspecified: Secondary | ICD-10-CM

## 2014-07-22 DIAGNOSIS — C20 Malignant neoplasm of rectum: Secondary | ICD-10-CM

## 2014-07-22 DIAGNOSIS — D649 Anemia, unspecified: Secondary | ICD-10-CM

## 2014-07-22 DIAGNOSIS — C787 Secondary malignant neoplasm of liver and intrahepatic bile duct: Secondary | ICD-10-CM

## 2014-07-22 DIAGNOSIS — I10 Essential (primary) hypertension: Secondary | ICD-10-CM

## 2014-07-22 DIAGNOSIS — D631 Anemia in chronic kidney disease: Secondary | ICD-10-CM

## 2014-07-22 DIAGNOSIS — R634 Abnormal weight loss: Secondary | ICD-10-CM | POA: Diagnosis not present

## 2014-07-22 DIAGNOSIS — Z95828 Presence of other vascular implants and grafts: Secondary | ICD-10-CM

## 2014-07-22 LAB — CBC WITH DIFFERENTIAL/PLATELET
BASO%: 0.3 % (ref 0.0–2.0)
BASOS ABS: 0 10*3/uL (ref 0.0–0.1)
EOS%: 5.8 % (ref 0.0–7.0)
Eosinophils Absolute: 0.4 10*3/uL (ref 0.0–0.5)
HEMATOCRIT: 28.6 % — AB (ref 34.8–46.6)
HEMOGLOBIN: 9.3 g/dL — AB (ref 11.6–15.9)
LYMPH%: 10.7 % — ABNORMAL LOW (ref 14.0–49.7)
MCH: 32.2 pg (ref 25.1–34.0)
MCHC: 32.5 g/dL (ref 31.5–36.0)
MCV: 99 fL (ref 79.5–101.0)
MONO#: 0.7 10*3/uL (ref 0.1–0.9)
MONO%: 11.3 % (ref 0.0–14.0)
NEUT#: 4.4 10*3/uL (ref 1.5–6.5)
NEUT%: 71.9 % (ref 38.4–76.8)
Platelets: 362 10*3/uL (ref 145–400)
RBC: 2.89 10*6/uL — ABNORMAL LOW (ref 3.70–5.45)
RDW: 16.5 % — ABNORMAL HIGH (ref 11.2–14.5)
WBC: 6.2 10*3/uL (ref 3.9–10.3)
lymph#: 0.7 10*3/uL — ABNORMAL LOW (ref 0.9–3.3)

## 2014-07-22 LAB — IRON AND TIBC CHCC
%SAT: 26 % (ref 21–57)
Iron: 61 ug/dL (ref 41–142)
TIBC: 233 ug/dL — ABNORMAL LOW (ref 236–444)
UIBC: 172 ug/dL (ref 120–384)

## 2014-07-22 LAB — COMPREHENSIVE METABOLIC PANEL (CC13)
ALBUMIN: 3 g/dL — AB (ref 3.5–5.0)
ALK PHOS: 185 U/L — AB (ref 40–150)
ALT: 23 U/L (ref 0–55)
AST: 50 U/L — ABNORMAL HIGH (ref 5–34)
Anion Gap: 15 mEq/L — ABNORMAL HIGH (ref 3–11)
BILIRUBIN TOTAL: 0.38 mg/dL (ref 0.20–1.20)
BUN: 28.5 mg/dL — AB (ref 7.0–26.0)
CALCIUM: 9.2 mg/dL (ref 8.4–10.4)
CO2: 16 meq/L — AB (ref 22–29)
Chloride: 104 mEq/L (ref 98–109)
Creatinine: 2.5 mg/dL — ABNORMAL HIGH (ref 0.6–1.1)
EGFR: 20 mL/min/{1.73_m2} — ABNORMAL LOW (ref >90)
Glucose: 116 mg/dl (ref 70–140)
Potassium: 4 mEq/L (ref 3.5–5.1)
Sodium: 135 mEq/L — ABNORMAL LOW (ref 136–145)
TOTAL PROTEIN: 7 g/dL (ref 6.4–8.3)

## 2014-07-22 LAB — FERRITIN CHCC: Ferritin: 458 ng/ml — ABNORMAL HIGH (ref 9–269)

## 2014-07-22 MED ORDER — SODIUM CHLORIDE 0.9 % IJ SOLN
10.0000 mL | INTRAMUSCULAR | Status: DC | PRN
  Administered 2014-07-22: 10 mL via INTRAVENOUS
  Filled 2014-07-22: qty 10

## 2014-07-22 MED ORDER — CAPECITABINE 500 MG PO TABS
ORAL_TABLET | ORAL | Status: DC
Start: 2014-07-22 — End: 2014-08-12

## 2014-07-22 MED ORDER — HEPARIN SOD (PORK) LOCK FLUSH 100 UNIT/ML IV SOLN
500.0000 [IU] | Freq: Once | INTRAVENOUS | Status: AC
  Administered 2014-07-22: 500 [IU] via INTRAVENOUS
  Filled 2014-07-22: qty 5

## 2014-07-22 NOTE — Progress Notes (Signed)
Hematology and Oncology Follow Up Visit  Ashley Davenport [id number] Apr 20, 1930 81 y.o. 07/22/2014 1:12 PM Ashley Davenport, MDClark, Ashley Broach, MD   Principle Diagnosis: 81 year old woman with rectal cancer diagnosed  on  09/01/2006. She hadT3 N0 moderately differentiated adenocarcinoma of the rectum with evidence of LV invasion. Now she has stage IV disease with liver mets.    Prior Therapy: She is status post neoadjuvant continuous infusion 5-FU with external radiation therapy between July 10, 2006 and Sep 01, 2006.  She declined abdominoperineal resection. She went on to receive additional 3 cycles of Xeloda, but developed hand-foot syndrome and declined further therapy.  She received FOLFIRI + Avastin. First cycle given on 09/28/12 with FOLFIRI alone. Cycle 4 was delayed due to grade 2 fatigue and grade 2-3 diarrhea. She is status post 12 cycles completed in December of 2014.  She is status post liver directed therapy in the form of Y-90 SIR sphere radioembolization. This was done on 10/22/2013. She is status post liver directed therapy as well as in December 2015.  Current therapy: She started maintenance chemotherapy with oral Xeloda in 05/2013. She receives 500 mg BID 2 weeks on and 1 week off. Since the last office visit patient resumed 1000 mg BID 2 weeks on and 1 week off  Interim History: Ashley Davenport presents today for a follow up visit. Since the last visit, she continues to do relatively well. She is tolerating Xeloda without any new complications. She hand and feet irritation which is unchanged. She has not reported any increased erythema or desquamation. 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 This predominantly grade 1 sensory without any interference with her activities of daily living. She states that she took the Xeloda 500 mg twice a day for 1 cycle did not notice any difference in her peripheral neuropathy or other symptoms and so  resumed taking Xeloda thousand milligrams twice daily for 2 weeks on and one-week off. She reports decreased appetite stating that food just doesn't taste good to her. She is eating 3 meals a day but they're very light. As an example she will have juice and toast for breakfast, package of peanut butter nabs and juice for a mid day snack, and some sort of protein and vegetable for dinner.  Her weight is down about 6 pounds.  No nausea. Vomiting, or diarrhea. No chest pain, shortness of breath, or dyspnea. She continues to have excellent quality of life and has regained most activities of daily living.  She lives alone and states she goes to the gym three times a week to help her stay strong. She has not reported any genitourinary or gastrointestinal symptoms. She has not reported any neurological symptoms or cardiovascular symptoms. She has not reported any chest pain difficulty breathing cough or hemoptysis or hematemesis. She has not reported any lymphadenopathy or petechiae. .She tolerated this second procedure without difficulty. The remainder of review of systems was unremarkable.  Medications: I have reviewed the patient's current medications.     Allergies:  Allergies  Allergen Reactions  . Shellfish Allergy Itching and Rash    Feels itching internally.  . Tramadol Hcl Swelling    REACTION: Tongue and lips swell  . Iodine Itching  . Meperidine Hcl Other (See Comments)    REACTION: Hypotension      Physical Exam: Blood pressure 139/69, pulse 70, temperature 98.1 F (36.7 C), temperature source Oral, resp. rate 18, height 5\' 4"  (1.626 m), weight  162 lb 12.8 oz (73.846 kg), SpO2 100 %. ECOG: 1 General appearance: alert awake not in any distress. Appeared younger than stated age. Head: Normocephalic, no lesions. Neck: no adenopathy, no thyromegaly. Lymph nodes: Cervical, supraclavicular, and axillary nodes normal. Heart:regular rate and rhythm, S1, S2 normal, no murmur, click, rub or  gallop.  Lung:chest clear, no wheezing, rales, normal symmetric air entry Abdomen: soft, non-tender, without masses or organomegaly EXT: Slight erythema noted in her palms. No desquamation noted.   Lab Results: Lab Results  Component Value Date   WBC 6.2 07/22/2014   HGB 9.3* 07/22/2014   HCT 28.6* 07/22/2014   MCV 99.0 07/22/2014   PLT 362 07/22/2014     Chemistry      Component Value Date/Time   NA 135* 07/22/2014 1058   NA 136* 03/11/2014 0755   K 4.0 07/22/2014 1058   K 4.3 03/11/2014 0755   CL 101 03/11/2014 0755   CL 102 09/25/2012 0933   CO2 16* 07/22/2014 1058   CO2 20 03/11/2014 0755   BUN 28.5* 07/22/2014 1058   BUN 47* 03/11/2014 0755   CREATININE 2.5* 07/22/2014 1058   CREATININE 2.47* 03/11/2014 0755      Component Value Date/Time   CALCIUM 9.2 07/22/2014 1058   CALCIUM 9.5 03/11/2014 0755   ALKPHOS 185* 07/22/2014 1058   ALKPHOS 90 03/03/2014 1422   AST 50* 07/22/2014 1058   AST 29 03/03/2014 1422   ALT 23 07/22/2014 1058   ALT 10 03/03/2014 1422   BILITOT 0.38 07/22/2014 1058   BILITOT 0.4 03/03/2014 1422      EXAM: NUCLEAR MEDICINE PET SKULL BASE TO THIGH  TECHNIQUE: 8.8 mCi F-18 FDG was injected intravenously. Full-ring PET imaging was performed from the skull base to thigh after the radiotracer. CT data was obtained and used for attenuation correction and anatomic localization.  FASTING BLOOD GLUCOSE: Value: Ninety-eight mg/dl  COMPARISON: PET of 12/18/2013  FINDINGS: NECK  No areas of abnormal hypermetabolism.  CHEST  No areas of abnormal hypermetabolism.  ABDOMEN/PELVIS  A mass centered in the medial segment left lobe of the liver is increased in size, but difficult to measure secondary to ill-defined appearance. Replaces the majority of the medial segment left lobe, measuring up to 7.6 x 7.9 cm. On the order of 5.1 x 5.9 cm on the prior exam (when remeasured). Hypermetabolic, heterogeneously so. Measures a  S.U.V. max of 17.0. On the prior exam, this measured a S.U.V. max of 19.0.  A mass in the central right lobe of the liver measures 3.8 x 4.0 cm and a S.U.V. max of 9.5 on image 97. On the prior exam, this measured 2.5 x 2.1 cm and a S.U.V. max of 12.4.  Hypermetabolism in the porta hepatis is likely nodal. Suboptimally evaluated secondary to beam hardening artifact from embolization coils in this area. Measures a S.U.V. max of 7.7, including on image 105. New.  SKELETON  No abnormal marrow activity.  CT IMAGES PERFORMED FOR ATTENUATION CORRECTION  No cervical adenopathy. Bilateral carotid atherosclerosis.  Aortic and branch vessel atherosclerosis. Mild cardiomegaly. Small hiatal hernia. 3 mm lingular nodule on image 29, not readily apparent on the prior exam.  A subpleural 3 mm left lower lobe nodule on image 34 is also not readily apparent on the prior.  3 mm right upper lobe nodule on image 19 of the similar on the prior.  Aortic stent graft repair. Normal adrenal glands. Right renal cysts. Focus of hyper attenuation in the interpolar right  kidney is likely due to a complex cyst but technically indeterminate. Hepatic cysts. Extensive colonic diverticulosis. Trace cul-de-sac fluid is similar.  IMPRESSION: 1. Both hepatic metastasis demonstrate interval enlargement but decrease in hypermetabolism. 2. New porta hepatis hypermetabolism is likely nodal, but suboptimally evaluated on CT images secondary to beam hardening artifact from embolization coronal. 3. Small pulmonary nodules. Some are new. Indeterminate. Recommend attention on follow-up. Otherwise, no evidence of extra-abdominal metastatic disease. 4. Similar trace cul-de-sac fluid.    Impression and Plan:  81 year old woman with:  1. T3 N0 moderately differentiated adenocarcinoma of the rectum with evidence of LV invasion and positive surgical margins 05/30/2006. She has metastatic disease to the  liver that was discovered in June 2014 and and status post chemotherapy outlined above. She is currently on maintenance Xeloda.  PET scan was reviewed today and did not show any evidence of progression of disease that requires change of treatment. We will continue maintenance Xeloda at this time and switched to intravenous IV chemotherapy with clear signs of progression. I plan on repeating a PET scan in 6 months.  The plan is to continue oral Xeloda at 1000 mg twice a day  For 2 weeks on and 1 week off as she saw no difference in tolerance or symptoms with 500 mg BID.   2. Anemia: S/P Feraheme. Anemia is multifactorial in nature we'll continue to monitor. I will repeat iron studies with the next visit.  3. Liver metastasis: She is status post liver directed therapy on 2 separate occasions. She has repeat imaging studies for her liver in April 2016.  4. HTN: She is on amlodipine and valsartan.   5. Port-A-Cath management: This will be flushed regularly every 6 weeks.  6. Weight loss: I have encouraged her to increase her caloric intake. I have also asked Dory Peru, our dietician  to call her and offer some suggestions/advice for increasing her caloric intake.  7. Followup: In in 6 weeks    Carlton Adam, [place]  4/19/20161:12 PM

## 2014-07-22 NOTE — Patient Instructions (Signed)

## 2014-07-22 NOTE — Telephone Encounter (Signed)
Gave avs & calendar for June. °

## 2014-07-22 NOTE — Patient Instructions (Signed)
Continue Xeloda 500 mg, 2 tablets by mouth twice a day for 2 weeks off for 1 week Follow up in 6 weeks

## 2014-07-23 LAB — CEA: CEA: 162.5 ng/mL — AB (ref 0.0–5.0)

## 2014-07-24 ENCOUNTER — Ambulatory Visit: Payer: Medicare Other | Admitting: Nutrition

## 2014-07-24 NOTE — Progress Notes (Signed)
Contacted patient by phone at provider request.   Patient reports poor appetite and 6 pound weight loss at last visit. Patient states she is interested in trying an oral nutrition supplement that is lactose-free and a clear liquid. Educated patient on strategies for improving oral intake incorporating protein at mealtimes. Encouraged snacks between meals. Educated patient about availability of clear liquid oral nutrition supplements. Will provide samples, fact sheets for patient to pick up on Monday, April 25.   Questions were answered.   Teach back method used.

## 2014-07-31 ENCOUNTER — Telehealth: Payer: Self-pay | Admitting: *Deleted

## 2014-07-31 DIAGNOSIS — D631 Anemia in chronic kidney disease: Secondary | ICD-10-CM

## 2014-07-31 DIAGNOSIS — N189 Chronic kidney disease, unspecified: Secondary | ICD-10-CM

## 2014-07-31 DIAGNOSIS — C20 Malignant neoplasm of rectum: Secondary | ICD-10-CM

## 2014-07-31 NOTE — Telephone Encounter (Signed)
POF sent to scheduling for labs and [facility]. UA and culture ordered per protocol.

## 2014-07-31 NOTE — Telephone Encounter (Signed)
Patient called stating,"I'm concerned about my urine. For the past week, I've had bloody urine. It's not every day and the amount isn't the same each time." Patient denied nausea, vomiting, diarrhea, rectal bleeding, fevers, chills, nose bleeds and cough. Instructed patient to come in tomorrow and see Selena Lesser, NP, in [facility]. Patient verbalized understanding. POF sent to scheduling.

## 2014-07-31 NOTE — Telephone Encounter (Signed)
Patient denied any itching, burning or odor to urine.

## 2014-08-01 ENCOUNTER — Other Ambulatory Visit (HOSPITAL_BASED_OUTPATIENT_CLINIC_OR_DEPARTMENT_OTHER): Payer: Medicare Other

## 2014-08-01 ENCOUNTER — Ambulatory Visit (HOSPITAL_BASED_OUTPATIENT_CLINIC_OR_DEPARTMENT_OTHER): Payer: Medicare Other | Admitting: Nurse Practitioner

## 2014-08-01 VITALS — BP 135/67 | HR 77 | Temp 98.4°F | Resp 18 | Wt 163.9 lb

## 2014-08-01 DIAGNOSIS — N939 Abnormal uterine and vaginal bleeding, unspecified: Secondary | ICD-10-CM

## 2014-08-01 DIAGNOSIS — C787 Secondary malignant neoplasm of liver and intrahepatic bile duct: Secondary | ICD-10-CM | POA: Diagnosis not present

## 2014-08-01 DIAGNOSIS — C20 Malignant neoplasm of rectum: Secondary | ICD-10-CM

## 2014-08-01 DIAGNOSIS — K644 Residual hemorrhoidal skin tags: Secondary | ICD-10-CM | POA: Diagnosis not present

## 2014-08-01 DIAGNOSIS — N189 Chronic kidney disease, unspecified: Secondary | ICD-10-CM

## 2014-08-01 DIAGNOSIS — R319 Hematuria, unspecified: Secondary | ICD-10-CM

## 2014-08-01 DIAGNOSIS — D631 Anemia in chronic kidney disease: Secondary | ICD-10-CM

## 2014-08-01 LAB — URINALYSIS, MICROSCOPIC - CHCC
Bilirubin (Urine): NEGATIVE
Blood: NEGATIVE
Glucose: NEGATIVE mg/dL
Ketones: NEGATIVE mg/dL
LEUKOCYTE ESTERASE: NEGATIVE
NITRITE: NEGATIVE
Protein: 30 mg/dL
Specific Gravity, Urine: 1.01 (ref 1.003–1.035)
UROBILINOGEN UR: 0.2 mg/dL (ref 0.2–1)
pH: 6 (ref 4.6–8.0)

## 2014-08-02 ENCOUNTER — Encounter: Payer: Self-pay | Admitting: Nurse Practitioner

## 2014-08-02 DIAGNOSIS — N939 Abnormal uterine and vaginal bleeding, unspecified: Secondary | ICD-10-CM | POA: Insufficient documentation

## 2014-08-02 NOTE — Progress Notes (Signed)
[facility]   HPI: Ashley Davenport 81 y.o. female diagnosed with rectal cancer; with liver metastasis.  Currently undergoing Xeloda oral therapy.   Pt reports intermittent blood on tissue when she wipes after urination for past week.  She denies any dysuria, hematuria, frequency, or flank pain. She denies any fevers or chills.  She denies any abd pain.   HPI  ROS  Past Medical History  Diagnosis Date  . AAA (abdominal aortic aneurysm) 2011  . Hyperlipidemia   . Hypertension   . Shingles     POST HERPATIC PAIN FROM SHINGLES  . Cancer     Rectal  . History of acute gouty arthritis   . Rectal cancer   . Kidney cysts   . Anemia     HX OF ANEMIA WITH CHEMO 2008  . Hypothyroidism     Past Surgical History  Procedure Laterality Date  . Abdominal hysterectomy  1961    total  . Tonsillectomy  1949  . Bunionectomy Bilateral   . Rectal tumor removed  2008    CANCEROUS  . Abdominal aorta aneurysm repair -stent  2011  . Portacath placement N/A 09/24/2012    Procedure: INSERTION PORT-A-CATH;  Surgeon: Stark Klein, MD;  Location: [facility];  Service: General;  Laterality: N/A;  . Abdominal aortic aneurysm repair    . Hepatic/embolization arteriogram with y-90 radioembolization  10/21/13    has Anemia associated with chronic renal failure; Rectal cancer; Abdominal aneurysm without mention of rupture; Aftercare following surgery of the circulatory system, NEC; and Vaginal bleeding on her problem list.    is allergic to shellfish allergy; tramadol hcl; iodine; and meperidine hcl.    Medication List       This list is accurate as of: 08/01/14 11:59 PM.  Always use your most recent med list.               allopurinol 300 MG tablet  Commonly known as:  ZYLOPRIM  Take 150 mg by mouth every morning.     amLODipine 10 MG tablet  Commonly known as:  NORVASC     Biotin 5000 MCG Caps  Take 5,000 mcg by mouth every morning.     capecitabine 500 MG tablet    Commonly known as:  XELODA  Take 2 tablets by mouth twice daily after meals for 14 days on, then 7 days off.     colchicine 0.6 MG tablet     CoQ-10 200 MG Caps  Take 200 mg by mouth at bedtime.     ferrous sulfate 325 (65 FE) MG tablet  Take 325 mg by mouth daily with breakfast.     furosemide 20 MG tablet  Commonly known as:  LASIX  Take 20 mg by mouth every morning.     irbesartan 300 MG tablet  Commonly known as:  AVAPRO  Take 300 mg by mouth every morning.     levothyroxine 75 MCG tablet  Commonly known as:  SYNTHROID, LEVOTHROID  Take 75 mcg by mouth every morning.     lidocaine-prilocaine cream  Commonly known as:  EMLA  Apply 1 application topically daily as needed (Applies to port-a-cath.).     ondansetron 8 MG tablet  Commonly known as:  ZOFRAN  Take 8 mg by mouth every 8 (eight) hours as needed for nausea or vomiting.     polyvinyl alcohol 1.4 % ophthalmic solution  Commonly known as:  LIQUIFILM TEARS  Place 1 drop into both eyes 4 (  four) times daily.     rosuvastatin 10 MG tablet  Commonly known as:  CRESTOR  Take 10 mg by mouth every evening.         PHYSICAL EXAMINATION  Oncology Vitals 08/01/2014 07/22/2014 06/04/2014 05/30/2014 04/29/2014 03/18/2014 03/11/2014  Height - 163 cm 163 cm - 163 cm 163 cm -  Weight 74.345 kg 73.846 kg 76.34 kg - 76.839 kg 78.019 kg -  Weight (lbs) 163 lbs 14 oz 162 lbs 13 oz 168 lbs 5 oz - 169 lbs 6 oz 172 lbs -  BMI (kg/m2) - 27.94 kg/m2 28.89 kg/m2 - 29.08 kg/m2 29.52 kg/m2 -  Temp 98.4 98.1 98 97.8 98.1 98.8 -  Pulse 77 70 68 74 90 66 71  Resp 18 18 18  - 18 18 18   Resp (Historical as of 11/03/11) - - - - - - -  SpO2 100 100 99 - 100 - 100  BSA (m2) - 1.83 m2 1.86 m2 - 1.86 m2 1.88 m2 -   BP Readings from Last 3 Encounters:  08/01/14 135/67  07/22/14 139/69  06/04/14 134/61    Physical Exam  Constitutional: She is oriented to person, place, and time and well-developed, well-nourished, and in no distress.  HENT:   Head: Normocephalic and atraumatic.  Mouth/Throat: Oropharynx is clear and moist.  Eyes: Conjunctivae and EOM are normal. Pupils are equal, round, and reactive to light. Right eye exhibits no discharge. Left eye exhibits no discharge. No scleral icterus.  Neck: Normal range of motion. Neck supple. No JVD present. No tracheal deviation present. No thyromegaly present.  Cardiovascular: Normal rate, regular rhythm, normal heart sounds and intact distal pulses.   Pulmonary/Chest: Effort normal and breath sounds normal. No respiratory distress. She has no wheezes. She has no rales. She exhibits no tenderness.  Abdominal: Soft. Bowel sounds are normal. She exhibits no distension and no mass. There is no tenderness. There is no rebound and no guarding.  RUQ with palpable edge of liver noted. Abdomen nontender on exam.   Genitourinary: Vagina normal. No vaginal discharge found.  Tiny external hemorrhoid.  No rectal bleeding noted.   Musculoskeletal: Normal range of motion. She exhibits no edema or tenderness.  Lymphadenopathy:    She has no cervical adenopathy.  Neurological: She is alert and oriented to person, place, and time. Gait normal.  Skin: Skin is warm and dry. No rash noted. No erythema. No pallor.  Psychiatric: Affect normal.  Nursing note and vitals reviewed.   LABORATORY DATA:. Appointment on 08/01/2014  Component Date Value Ref Range Status  . Glucose 08/01/2014 Negative  Negative mg/dL Final  . Bilirubin (Urine) 08/01/2014 Negative  Negative Final  . Ketones 08/01/2014 Negative  Negative mg/dL Final  . Specific Gravity, Urine 08/01/2014 1.010  1.003 - 1.035 Final  . Blood 08/01/2014 Negative  Negative Final  . pH 08/01/2014 6.0  4.6 - 8.0 Final  . Protein 08/01/2014 30  Negative- <30 mg/dL Final  . Urobilinogen, UR 08/01/2014 0.2  0.2 - 1 mg/dL Final  . Nitrite 08/01/2014 Negative  Negative Final  . Leukocyte Esterase 08/01/2014 Negative  Negative Final  . RBC / HPF  08/01/2014 0-2  0 - 2 Final  . WBC, UA 08/01/2014 3-6  0 - 2 Final  . Bacteria, UA 08/01/2014 Few  Negative- Trace Final  . Epithelial Cells 08/01/2014 Few  Negative- Few Final     RADIOGRAPHIC STUDIES: No results found.  ASSESSMENT/PLAN:    Rectal cancer Pt continues with Xeloda oral  therapy as directed. She is scheduled to return on 68/16 for labs and a follow up visit.     Vaginal bleeding Pt reports intermittent blood on tissue when she wipes after urination for past week.  She denies any dysuria, hematuria, frequency, or flank pain. She denies any fevers or chills.  She denies any abd pain.   On exam- no vaginal discharge, no odor, no bleeding from vagina. No obvious lesions to vagina. Pt does have 1 very small external hemorrhoid that does not appear inflamed.   Urinalysis obtained today revealed no hematuria; and no obvious infection. Will await urine culture results.   Was able to obtain a GYN appt for pt for this coming Tuesday 08/05/14 with her GYN Dr. Phineas Real at 1030 for further eval and management of any GYN issues.    Will also review all findings with Dr. Alen Blew as well.        Patient stated understanding of all instructions; and was in agreement with this plan of care. The patient knows to call the clinic with any problems, questions or concerns.   Review/collaboration with Dr. Alen Blew regarding all aspects of patient's visit today.   Total time spent with patient was 40 minutes;  with greater than 75 percent of that time spent in face to face counseling regarding patient's symptoms,  and coordination of care and follow up.  Disclaimer: This note was dictated with voice recognition software. Similar sounding words can inadvertently be transcribed and may not be corrected upon review.   Drue Second, NP 08/02/2014

## 2014-08-02 NOTE — Assessment & Plan Note (Signed)
Pt continues with Xeloda oral therapy as directed. She is scheduled to return on 68/16 for labs and a follow up visit.

## 2014-08-02 NOTE — Assessment & Plan Note (Signed)
Pt reports intermittent blood on tissue when she wipes after urination for past week.  She denies any dysuria, hematuria, frequency, or flank pain. She denies any fevers or chills.  She denies any abd pain.   On exam- no vaginal discharge, no odor, no bleeding from vagina. No obvious lesions to vagina. Pt does have 1 very small external hemorrhoid that does not appear inflamed.   Urinalysis obtained today revealed no hematuria; and no obvious infection. Will await urine culture results.   Was able to obtain a GYN appt for pt for this coming Tuesday 08/05/14 with her GYN Dr. Phineas Real at 1030 for further eval and management of any GYN issues.    Will also review all findings with Dr. Alen Blew as well.

## 2014-08-03 LAB — URINE CULTURE

## 2014-08-05 ENCOUNTER — Other Ambulatory Visit (HOSPITAL_COMMUNITY)
Admission: RE | Admit: 2014-08-05 | Discharge: 2014-08-05 | Disposition: A | Discharge status: Home / Self Care | Payer: Medicare Other | Source: Ambulatory Visit | Attending: Gynecology | Admitting: Gynecology

## 2014-08-05 ENCOUNTER — Encounter: Payer: Self-pay | Admitting: Gynecology

## 2014-08-05 ENCOUNTER — Ambulatory Visit (INDEPENDENT_AMBULATORY_CARE_PROVIDER_SITE_OTHER): Payer: Medicare Other | Admitting: Gynecology

## 2014-08-05 VITALS — BP 120/78 | Ht 64.0 in | Wt 162.0 lb

## 2014-08-05 DIAGNOSIS — Z1272 Encounter for screening for malignant neoplasm of vagina: Secondary | ICD-10-CM

## 2014-08-05 DIAGNOSIS — Z124 Encounter for screening for malignant neoplasm of cervix: Secondary | ICD-10-CM | POA: Diagnosis present

## 2014-08-05 DIAGNOSIS — N952 Postmenopausal atrophic vaginitis: Secondary | ICD-10-CM | POA: Diagnosis not present

## 2014-08-05 DIAGNOSIS — N898 Other specified noninflammatory disorders of vagina: Secondary | ICD-10-CM

## 2014-08-05 DIAGNOSIS — N950 Postmenopausal bleeding: Secondary | ICD-10-CM

## 2014-08-05 NOTE — Addendum Note (Signed)
Addended by: Nelva Nay on: 08/05/2014 11:34 AM   Modules accepted: Orders

## 2014-08-05 NOTE — Patient Instructions (Signed)
Office will call you with biopsy results 

## 2014-08-05 NOTE — Progress Notes (Signed)
Ashley Davenport 1931-01-22 [id number]        81 y.o.  G3P3 Presents having had vaginal spotting on and off over the last several weeks.  History of both rectal cancer years ago and current liver cancer. History of radiation therapy for rectal cancer as well as chemotherapy and radiation seeds for her liver. Saw her oncologist with a negative urinalysis for blood and was referred for evaluation. She has a history of TAH/BSO in the past for bleeding. Is having no symptoms such as pelvic pain/discomfort, frequency, urgency or dysuria. No rectal complaints as far as issues with bowel movement, discomfort, constipation, diarrhea.  Past medical history,surgical history, problem list, medications, allergies, family history and social history were all reviewed and documented in the EPIC chart.  Directed ROS with pertinent positives and negatives documented in the history of present illness/assessment and plan.  Exam: Kim assistant Filed Vitals:   08/05/14 1013  BP: 120/78  Height: 5\' 4"  (1.626 m)  Weight: 162 lb (73.483 kg)   General appearance:  Normal Abdomen:  Soft nontender without masses guarding rebound. No inguinal adenopathy. Pelvic: External BUS vagina with atrophic changes. Small raised dark papule upper left vaginal cuff spotting with Q-tip manipulation. Multiple bleb-like red friable areas throughout the vagina from the vaginal cuff through the introitus. Bimanual exam without masses or tenderness. Rectal/vaginal exam normal. Old small external hemorrhoid. No evidence of gross fissures or rectal bleeding.  Colposcopy performed after acetic acid cleanse with small dark bluish papillary lesion upper left vaginal cuff. Bleb-like red friable areas throughout the vagina. No acetowhite/atypical vasculature noted along the mucosa. Biopsy of the left upper vaginal papillary area taken labeled #1. Representative biopsy of the red papules taken left vaginal sidewall 1-2 fingerbreadths within the  introital opening labeled #2. Monsel solution applied to the upper vaginal cuff biopsy site. Good hemostasis noted. Patient tolerated well.  Assessment/Plan:  81 y.o. G3P3 with above history and exam. Differential to include cancer versus possible radiation changes/atrophic changes discussed. Will follow up for the biopsy results in several days. If negative will consider possible vaginal estrogen support for now. If positive then we will discuss with oncology.    Anastasio Auerbach MD, 11:12 AM 08/05/2014

## 2014-08-07 LAB — CYTOLOGY - PAP

## 2014-08-08 ENCOUNTER — Telehealth: Payer: Self-pay

## 2014-08-08 NOTE — Telephone Encounter (Signed)
Error, pt not called

## 2014-08-12 ENCOUNTER — Other Ambulatory Visit: Payer: Self-pay | Admitting: *Deleted

## 2014-08-12 DIAGNOSIS — C20 Malignant neoplasm of rectum: Secondary | ICD-10-CM

## 2014-08-12 DIAGNOSIS — C787 Secondary malignant neoplasm of liver and intrahepatic bile duct: Secondary | ICD-10-CM

## 2014-08-12 DIAGNOSIS — C189 Malignant neoplasm of colon, unspecified: Secondary | ICD-10-CM

## 2014-08-12 MED ORDER — CAPECITABINE 500 MG PO TABS: ORAL_TABLET | ORAL | Status: DC

## 2014-09-02 ENCOUNTER — Other Ambulatory Visit: Payer: Self-pay | Admitting: *Deleted

## 2014-09-02 DIAGNOSIS — C189 Malignant neoplasm of colon, unspecified: Secondary | ICD-10-CM

## 2014-09-02 DIAGNOSIS — C20 Malignant neoplasm of rectum: Secondary | ICD-10-CM

## 2014-09-02 DIAGNOSIS — C787 Secondary malignant neoplasm of liver and intrahepatic bile duct: Secondary | ICD-10-CM

## 2014-09-02 MED ORDER — CAPECITABINE 500 MG PO TABS: ORAL_TABLET | ORAL | Status: DC

## 2014-09-08 ENCOUNTER — Ambulatory Visit: Payer: Medicare Other | Admitting: Gynecology

## 2014-09-08 ENCOUNTER — Encounter: Payer: Self-pay | Admitting: Gynecology

## 2014-09-08 ENCOUNTER — Ambulatory Visit (INDEPENDENT_AMBULATORY_CARE_PROVIDER_SITE_OTHER): Payer: Medicare Other | Admitting: Gynecology

## 2014-09-08 DIAGNOSIS — N952 Postmenopausal atrophic vaginitis: Secondary | ICD-10-CM

## 2014-09-08 DIAGNOSIS — N950 Postmenopausal bleeding: Secondary | ICD-10-CM

## 2014-09-08 NOTE — Patient Instructions (Signed)
Insert the vaginal estrogen cream twice weekly Follow up in 6 weeks for reexamination

## 2014-09-08 NOTE — Progress Notes (Signed)
Ashley Davenport September 12, 1930 [id number]        81 y.o.  G3P3 Presents with ongoing vaginal spotting daily.  Was evaluated 08/05/2014 with red papillary lesion noted upper vagina which was biopsied and more flat small red lesions along the vagina which were also biopsied. Both returned proliferative vascular areas but no atypia or malignancy. I suspect this is due to her radiation treatment for her rectal cancer.  She is continuing to bleed daily with spotting. No pain.  Past medical history,surgical history, problem list, medications, allergies, family history and social history were all reviewed and documented in the EPIC chart.  Directed ROS with pertinent positives and negatives documented in the history of present illness/assessment and plan.  Exam: Anderson [place] assistant There were no vitals filed for this visit. General appearance:  Normal Abdomen soft nontender without masses guarding rebound Pelvic external be less vagina with atrophic changes. Very friable vaginal mucosa noted with papillary red lesion outer lateral vaginal wall within fingerbreadth of introitus tender 11:00 position. Bimanual without masses or tenderness.  Procedure: The papillary red area was biopsied off to the level of the surrounding vaginal mucosa and sent to pathology. Silver nitrate applied afterwards. Patient tolerated well.  Assessment/Plan:  81 y.o. G3P3 with ongoing bleeding which I think is secondary to atrophic/radiation changes. Follow up for the pathology which I think will probably be again a proliferative vascular type area. Recommended trial of vaginal estrogen twice weekly. Hopefully this will help to stabilize mucosa. Risks to include absorption with systemic side effects such as increased risk of thrombosis reviewed. Patient's comfortable trying this and will go ahead and start this and then follow up with me regardless in 6 weeks for reexamination.    Anastasio Auerbach MD, 12:34 PM  09/08/2014

## 2014-09-09 ENCOUNTER — Telehealth: Payer: Self-pay | Admitting: *Deleted

## 2014-09-09 MED ORDER — NONFORMULARY OR COMPOUNDED ITEM: Status: DC

## 2014-09-09 NOTE — Telephone Encounter (Signed)
-----   Message from Anastasio Auerbach, MD sent at 09/08/2014 12:38 PM EDT ----- [facility]radiol prefilled syringes, 3 months supply, one applicator twice weekly intravaginal

## 2014-09-09 NOTE — Telephone Encounter (Signed)
Rx called in 

## 2014-09-10 ENCOUNTER — Telehealth: Payer: Self-pay | Admitting: Oncology

## 2014-09-10 ENCOUNTER — Other Ambulatory Visit (HOSPITAL_BASED_OUTPATIENT_CLINIC_OR_DEPARTMENT_OTHER): Payer: Medicare Other

## 2014-09-10 ENCOUNTER — Ambulatory Visit (HOSPITAL_BASED_OUTPATIENT_CLINIC_OR_DEPARTMENT_OTHER): Payer: Medicare Other | Admitting: Oncology

## 2014-09-10 ENCOUNTER — Other Ambulatory Visit: Payer: Self-pay | Admitting: Oncology

## 2014-09-10 ENCOUNTER — Ambulatory Visit: Payer: Medicare Other

## 2014-09-10 VITALS — BP 141/70 | HR 78 | Temp 97.8°F | Resp 18 | Ht 64.0 in | Wt 159.3 lb

## 2014-09-10 DIAGNOSIS — L271 Localized skin eruption due to drugs and medicaments taken internally: Secondary | ICD-10-CM | POA: Diagnosis not present

## 2014-09-10 DIAGNOSIS — N189 Chronic kidney disease, unspecified: Secondary | ICD-10-CM

## 2014-09-10 DIAGNOSIS — D630 Anemia in neoplastic disease: Secondary | ICD-10-CM | POA: Diagnosis not present

## 2014-09-10 DIAGNOSIS — D509 Iron deficiency anemia, unspecified: Secondary | ICD-10-CM | POA: Diagnosis not present

## 2014-09-10 DIAGNOSIS — C787 Secondary malignant neoplasm of liver and intrahepatic bile duct: Secondary | ICD-10-CM | POA: Diagnosis not present

## 2014-09-10 DIAGNOSIS — C20 Malignant neoplasm of rectum: Secondary | ICD-10-CM | POA: Diagnosis present

## 2014-09-10 DIAGNOSIS — I10 Essential (primary) hypertension: Secondary | ICD-10-CM

## 2014-09-10 DIAGNOSIS — Z95828 Presence of other vascular implants and grafts: Secondary | ICD-10-CM

## 2014-09-10 DIAGNOSIS — D631 Anemia in chronic kidney disease: Secondary | ICD-10-CM

## 2014-09-10 DIAGNOSIS — R634 Abnormal weight loss: Secondary | ICD-10-CM | POA: Diagnosis not present

## 2014-09-10 DIAGNOSIS — D638 Anemia in other chronic diseases classified elsewhere: Secondary | ICD-10-CM

## 2014-09-10 LAB — CBC WITH DIFFERENTIAL/PLATELET
BASO%: 0.1 % (ref 0.0–2.0)
BASOS ABS: 0 10*3/uL (ref 0.0–0.1)
EOS ABS: 0.1 10*3/uL (ref 0.0–0.5)
EOS%: 1.4 % (ref 0.0–7.0)
HCT: 26.6 % — ABNORMAL LOW (ref 34.8–46.6)
HGB: 8.7 g/dL — ABNORMAL LOW (ref 11.6–15.9)
LYMPH%: 6.2 % — AB (ref 14.0–49.7)
MCH: 32.5 pg (ref 25.1–34.0)
MCHC: 32.7 g/dL (ref 31.5–36.0)
MCV: 99.3 fL (ref 79.5–101.0)
MONO#: 0.6 10*3/uL (ref 0.1–0.9)
MONO%: 8.3 % (ref 0.0–14.0)
NEUT#: 6.1 10*3/uL (ref 1.5–6.5)
NEUT%: 84 % — ABNORMAL HIGH (ref 38.4–76.8)
Platelets: 232 10*3/uL (ref 145–400)
RBC: 2.68 10*6/uL — AB (ref 3.70–5.45)
RDW: 20.8 % — ABNORMAL HIGH (ref 11.2–14.5)
WBC: 7.3 10*3/uL (ref 3.9–10.3)
lymph#: 0.5 10*3/uL — ABNORMAL LOW (ref 0.9–3.3)

## 2014-09-10 LAB — COMPREHENSIVE METABOLIC PANEL (CC13)
ALK PHOS: 213 U/L — AB (ref 40–150)
ALT: 31 U/L (ref 0–55)
AST: 130 U/L — ABNORMAL HIGH (ref 5–34)
Albumin: 2.5 g/dL — ABNORMAL LOW (ref 3.5–5.0)
Anion Gap: 9 mEq/L (ref 3–11)
BILIRUBIN TOTAL: 1.45 mg/dL — AB (ref 0.20–1.20)
BUN: 33 mg/dL — AB (ref 7.0–26.0)
CALCIUM: 8.6 mg/dL (ref 8.4–10.4)
CHLORIDE: 107 meq/L (ref 98–109)
CO2: 17 meq/L — AB (ref 22–29)
CREATININE: 2 mg/dL — AB (ref 0.6–1.1)
EGFR: 26 mL/min/{1.73_m2} — AB (ref >90)
Glucose: 177 mg/dl — ABNORMAL HIGH (ref 70–140)
Potassium: 3.6 mEq/L (ref 3.5–5.1)
Sodium: 134 mEq/L — ABNORMAL LOW (ref 136–145)
Total Protein: 6.2 g/dL — ABNORMAL LOW (ref 6.4–8.3)

## 2014-09-10 LAB — CEA: CEA: 568.5 ng/mL — ABNORMAL HIGH (ref 0.0–5.0)

## 2014-09-10 MED ORDER — FEXOFENADINE-PSEUDOEPHED ER 180-240 MG PO TB24: 1.0000 | ORAL_TABLET | Freq: Every day | ORAL | Status: DC

## 2014-09-10 MED ORDER — SODIUM CHLORIDE 0.9 % IJ SOLN
10.0000 mL | INTRAMUSCULAR | Status: DC | PRN
  Administered 2014-09-10: 10 mL via INTRAVENOUS
  Filled 2014-09-10: qty 10

## 2014-09-10 MED ORDER — HEPARIN SOD (PORK) LOCK FLUSH 100 UNIT/ML IV SOLN
500.0000 [IU] | Freq: Once | INTRAVENOUS | Status: AC
  Administered 2014-09-10: 500 [IU] via INTRAVENOUS
  Filled 2014-09-10: qty 5

## 2014-09-10 NOTE — Progress Notes (Signed)
Spoke with nurse Almyra Free at [facility], per dr Alen Blew, hold xeloda for now, d/t side effects.

## 2014-09-10 NOTE — Patient Instructions (Signed)

## 2014-09-10 NOTE — Telephone Encounter (Signed)
Gave and printed appt sched and avs for pt Aug  °

## 2014-09-10 NOTE — Progress Notes (Signed)
Hematology and Oncology Follow Up Visit  Ashley Davenport [id number] November 20, 1930 81 y.o. 09/10/2014 10:28 AM Ashley Davenport, MDClark, Don Broach, MD   Principle Diagnosis: 81 year old woman with rectal cancer diagnosed  on  09/01/2006. She hadT3 N0 moderately differentiated adenocarcinoma of the rectum with evidence of LV invasion. Now she has stage IV disease with liver mets.    Prior Therapy: She is status post neoadjuvant continuous infusion 5-FU with external radiation therapy between July 10, 2006 and Sep 01, 2006.  She declined abdominoperineal resection. She went on to receive additional 3 cycles of Xeloda, but developed hand-foot syndrome and declined further therapy.  She received FOLFIRI + Avastin. First cycle given on 09/28/12 with FOLFIRI alone. Cycle 4 was delayed due to grade 2 fatigue and grade 2-3 diarrhea. She is status post 12 cycles completed in December of 2014.  She is status post liver directed therapy in the form of Y-90 SIR sphere radioembolization. This was done on 10/22/2013. She is status post liver directed therapy as well as in December 2015.  Current therapy: She started maintenance chemotherapy with oral Xeloda in 05/2013. She receives 500 mg BID 2 weeks on and 1 week off. Since the last office visit patient resumed 1000 mg BID 2 weeks on and 1 week off  Interim History: [name] presents today for a follow up visit. Since the last visit, she doing slightly worse than previously. She is reporting generalized pruritus as well as allergy symptoms. She is reporting rhinorrhea, redness and excessive tearing. She also reported some congestion associated with it. She has reported worsening hand and foot syndrome on Xeloda at this time. She will also have reported decreased appetite and overall generalized weakness. Her appetite was reasonable but recently declined as well.  She does not report any nausea. Vomiting, or diarrhea. No chest pain, shortness of breath, or  dyspnea. She has not reported any genitourinary or gastrointestinal symptoms. She has not reported any neurological symptoms or cardiovascular symptoms. She has not reported any chest pain difficulty breathing cough or hemoptysis or hematemesis. She has not reported any lymphadenopathy or petechiae. .She tolerated this second procedure without difficulty. The remainder of review of systems was unremarkable.  Medications: I have reviewed the patient's current medications.     Allergies:  Allergies  Allergen Reactions  . Shellfish Allergy Itching and Rash    Feels itching internally.  . Tramadol Hcl Swelling    REACTION: Tongue and lips swell  . Iodine Itching  . Meperidine Hcl Other (See Comments)    REACTION: Hypotension      Physical Exam: Blood pressure 141/70, pulse 78, temperature 97.8 F (36.6 C), temperature source Oral, resp. rate 18, height 5\' 4"  (1.626 m), weight 159 lb 4.8 oz (72.258 kg), SpO2 100 %. ECOG: 1 General appearance: alert awake not in any distress.  Head: Normocephalic, no lesions. Erythema noted around her eyes bilaterally. Neck: no adenopathy, no thyromegaly. Lymph nodes: Cervical, supraclavicular, and axillary nodes normal. Heart:regular rate and rhythm, S1, S2 normal, no murmur, click, rub or gallop.  Lung:chest clear, no wheezing, rales, normal symmetric air entry Abdomen: soft, non-tender, without masses or organomegaly EXT: Slight erythema noted in her palms. No desquamation noted. Skin: No rashes or lesions. Increased erythema in the soles of her feet. No desquamation noted.  Lab Results: Lab Results  Component Value Date   WBC 7.3 09/10/2014   HGB 8.7* 09/10/2014   HCT 26.6* 09/10/2014   MCV 99.3 09/10/2014   PLT  232 09/10/2014     Chemistry      Component Value Date/Time   NA 135* 07/22/2014 1058   NA 136* 03/11/2014 0755   K 4.0 07/22/2014 1058   K 4.3 03/11/2014 0755   CL 101 03/11/2014 0755   CL 102 09/25/2012 0933   CO2 16*  07/22/2014 1058   CO2 20 03/11/2014 0755   BUN 28.5* 07/22/2014 1058   BUN 47* 03/11/2014 0755   CREATININE 2.5* 07/22/2014 1058   CREATININE 2.47* 03/11/2014 0755      Component Value Date/Time   CALCIUM 9.2 07/22/2014 1058   CALCIUM 9.5 03/11/2014 0755   ALKPHOS 185* 07/22/2014 1058   ALKPHOS 90 03/03/2014 1422   AST 50* 07/22/2014 1058   AST 29 03/03/2014 1422   ALT 23 07/22/2014 1058   ALT 10 03/03/2014 1422   BILITOT 0.38 07/22/2014 1058   BILITOT 0.4 03/03/2014 1422         Impression and Plan:  81 year old woman with:  1. T3 N0 moderately differentiated adenocarcinoma of the rectum with evidence of LV invasion and positive surgical margins 05/30/2006. She has metastatic disease to the liver that was discovered in June 2014 and and status post chemotherapy outlined above. She is currently on maintenance Xeloda.  PET scan on 05/30/2014 showed reasonably controlled disease especially in the liver. The plan is to continue with active surveillance and repeat imaging studies in 2 months from now.  She is reporting more complications related to Xeloda including worsening fatigue, tiredness, and foot syndrome. I have instructed her to stop Xeloda for the time being and we will assess whether she needs any therapy based on the next imaging study.   2. Anemia: S/P Feraheme. Anemia is multifactorial in nature with element of iron deficiency, malignancy and chronic disease. She also have element of iron deficiency. I'll plan on repeating iron studies with the next visit and consider IV iron and/or growth factor support  3. Liver metastasis: She is status post liver directed therapy on 2 separate occasions. She has repeat imaging studies in August 2016.  4. HTN: She is on amlodipine and valsartan.   5. Port-A-Cath management: This will be flushed regularly every 8 weeks.  6. Weight loss: I have encouraged her to increase her caloric intake. I feel stopping Xeloda will help her  symptoms.  7. Followup: In in 8 weeks    YJEHUD,JSHFW, PA-C  6/8/201610:28 AM

## 2014-09-12 ENCOUNTER — Telehealth: Payer: Self-pay

## 2014-09-12 NOTE — Telephone Encounter (Signed)
-----   Message from Anastasio Auerbach, MD sent at 09/12/2014  7:53 AM EDT ----- Tell patient the biopsies were benign. She should be using the estrogen cream and has a follow up appointment to see me

## 2014-09-12 NOTE — Telephone Encounter (Signed)
Should not be associated with cramping. She does not have a uterus and it should not affect the intestines

## 2014-09-12 NOTE — Telephone Encounter (Signed)
Patient asked if the estrogen cream you prescribed could cause cramping?  She said after using it on Weds for about an hour or two after she had terrible cramping like menstrual cramps but worse.  She is scheduled to use it again tomorrow.

## 2014-09-12 NOTE — Telephone Encounter (Signed)
Left detailed message with answer on home ans machine per DPR access note. Told her to call if any other questions.

## 2014-09-29 ENCOUNTER — Other Ambulatory Visit: Payer: Self-pay

## 2014-10-05 ENCOUNTER — Emergency Department (HOSPITAL_COMMUNITY): Payer: Medicare Other

## 2014-10-05 ENCOUNTER — Encounter (HOSPITAL_COMMUNITY): Payer: Self-pay | Admitting: Emergency Medicine

## 2014-10-05 ENCOUNTER — Inpatient Hospital Stay (HOSPITAL_COMMUNITY)
Admission: EM | Admit: 2014-10-05 | Discharge: 2014-10-13 | DRG: 435 | Disposition: A | Discharge status: Hospice | Payer: Medicare Other | Attending: Internal Medicine | Admitting: Internal Medicine

## 2014-10-05 DIAGNOSIS — E860 Dehydration: Secondary | ICD-10-CM | POA: Diagnosis present

## 2014-10-05 DIAGNOSIS — Z66 Do not resuscitate: Secondary | ICD-10-CM | POA: Diagnosis present

## 2014-10-05 DIAGNOSIS — Z809 Family history of malignant neoplasm, unspecified: Secondary | ICD-10-CM

## 2014-10-05 DIAGNOSIS — G9340 Encephalopathy, unspecified: Secondary | ICD-10-CM | POA: Diagnosis not present

## 2014-10-05 DIAGNOSIS — C801 Malignant (primary) neoplasm, unspecified: Secondary | ICD-10-CM | POA: Diagnosis present

## 2014-10-05 DIAGNOSIS — D72829 Elevated white blood cell count, unspecified: Secondary | ICD-10-CM | POA: Diagnosis present

## 2014-10-05 DIAGNOSIS — E875 Hyperkalemia: Secondary | ICD-10-CM | POA: Diagnosis present

## 2014-10-05 DIAGNOSIS — R112 Nausea with vomiting, unspecified: Secondary | ICD-10-CM | POA: Diagnosis not present

## 2014-10-05 DIAGNOSIS — Z87891 Personal history of nicotine dependence: Secondary | ICD-10-CM

## 2014-10-05 DIAGNOSIS — I129 Hypertensive chronic kidney disease with stage 1 through stage 4 chronic kidney disease, or unspecified chronic kidney disease: Secondary | ICD-10-CM | POA: Diagnosis present

## 2014-10-05 DIAGNOSIS — Z8249 Family history of ischemic heart disease and other diseases of the circulatory system: Secondary | ICD-10-CM

## 2014-10-05 DIAGNOSIS — R627 Adult failure to thrive: Secondary | ICD-10-CM | POA: Diagnosis present

## 2014-10-05 DIAGNOSIS — Z515 Encounter for palliative care: Secondary | ICD-10-CM

## 2014-10-05 DIAGNOSIS — C787 Secondary malignant neoplasm of liver and intrahepatic bile duct: Principal | ICD-10-CM | POA: Diagnosis present

## 2014-10-05 DIAGNOSIS — R531 Weakness: Secondary | ICD-10-CM

## 2014-10-05 DIAGNOSIS — Z90710 Acquired absence of both cervix and uterus: Secondary | ICD-10-CM

## 2014-10-05 DIAGNOSIS — N19 Unspecified kidney failure: Secondary | ICD-10-CM

## 2014-10-05 DIAGNOSIS — E039 Hypothyroidism, unspecified: Secondary | ICD-10-CM | POA: Diagnosis present

## 2014-10-05 DIAGNOSIS — N183 Chronic kidney disease, stage 3 (moderate): Secondary | ICD-10-CM | POA: Diagnosis present

## 2014-10-05 DIAGNOSIS — Z91013 Allergy to seafood: Secondary | ICD-10-CM

## 2014-10-05 DIAGNOSIS — K831 Obstruction of bile duct: Secondary | ICD-10-CM | POA: Diagnosis present

## 2014-10-05 DIAGNOSIS — Z9221 Personal history of antineoplastic chemotherapy: Secondary | ICD-10-CM

## 2014-10-05 DIAGNOSIS — N179 Acute kidney failure, unspecified: Secondary | ICD-10-CM | POA: Diagnosis present

## 2014-10-05 DIAGNOSIS — C20 Malignant neoplasm of rectum: Secondary | ICD-10-CM | POA: Diagnosis present

## 2014-10-05 DIAGNOSIS — E785 Hyperlipidemia, unspecified: Secondary | ICD-10-CM | POA: Diagnosis present

## 2014-10-05 DIAGNOSIS — Z833 Family history of diabetes mellitus: Secondary | ICD-10-CM

## 2014-10-05 DIAGNOSIS — R17 Unspecified jaundice: Secondary | ICD-10-CM

## 2014-10-05 LAB — URINE MICROSCOPIC-ADD ON

## 2014-10-05 LAB — URINALYSIS, ROUTINE W REFLEX MICROSCOPIC
Glucose, UA: NEGATIVE mg/dL
Hgb urine dipstick: NEGATIVE
Ketones, ur: NEGATIVE mg/dL
Nitrite: NEGATIVE
Protein, ur: NEGATIVE mg/dL
Specific Gravity, Urine: 1.015 (ref 1.005–1.030)
UROBILINOGEN UA: 1 mg/dL (ref 0.0–1.0)
pH: 5 (ref 5.0–8.0)

## 2014-10-05 LAB — CBC
HCT: 30.6 % — ABNORMAL LOW (ref 36.0–46.0)
Hemoglobin: 10.1 g/dL — ABNORMAL LOW (ref 12.0–15.0)
MCH: 31.9 pg (ref 26.0–34.0)
MCHC: 33 g/dL (ref 30.0–36.0)
MCV: 96.5 fL (ref 78.0–100.0)
PLATELETS: 308 10*3/uL (ref 150–400)
RBC: 3.17 MIL/uL — ABNORMAL LOW (ref 3.87–5.11)
RDW: 21.7 % — ABNORMAL HIGH (ref 11.5–15.5)
WBC: 11.1 10*3/uL — ABNORMAL HIGH (ref 4.0–10.5)

## 2014-10-05 LAB — COMPREHENSIVE METABOLIC PANEL
ALBUMIN: 2.6 g/dL — AB (ref 3.5–5.0)
ALT: 45 U/L (ref 14–54)
AST: 191 U/L — AB (ref 15–41)
Alkaline Phosphatase: 245 U/L — ABNORMAL HIGH (ref 38–126)
Anion gap: 14 (ref 5–15)
BILIRUBIN TOTAL: 7.2 mg/dL — AB (ref 0.3–1.2)
BUN: 68 mg/dL — ABNORMAL HIGH (ref 6–20)
CHLORIDE: 104 mmol/L (ref 101–111)
CO2: 15 mmol/L — ABNORMAL LOW (ref 22–32)
Calcium: 8.9 mg/dL (ref 8.9–10.3)
Creatinine, Ser: 3.92 mg/dL — ABNORMAL HIGH (ref 0.44–1.00)
GFR calc Af Amer: 11 mL/min — ABNORMAL LOW (ref >60)
GFR calc non Af Amer: 10 mL/min — ABNORMAL LOW (ref >60)
Glucose, Bld: 122 mg/dL — ABNORMAL HIGH (ref 65–99)
Potassium: 4.5 mmol/L (ref 3.5–5.1)
Sodium: 133 mmol/L — ABNORMAL LOW (ref 135–145)
Total Protein: 6.4 g/dL — ABNORMAL LOW (ref 6.5–8.1)

## 2014-10-05 LAB — TROPONIN I: Troponin I: 0.03 ng/mL (ref <0.031)

## 2014-10-05 MED ORDER — SODIUM CHLORIDE 0.9 % IV BOLUS (SEPSIS)
1000.0000 mL | Freq: Once | INTRAVENOUS | Status: AC
  Administered 2014-10-05: 1000 mL via INTRAVENOUS

## 2014-10-05 MED ORDER — ONDANSETRON HCL 4 MG/2ML IJ SOLN
4.0000 mg | Freq: Once | INTRAMUSCULAR | Status: AC
  Administered 2014-10-05: 4 mg via INTRAVENOUS
  Filled 2014-10-05: qty 2

## 2014-10-05 MED ORDER — SODIUM CHLORIDE 0.9 % IV BOLUS (SEPSIS)
500.0000 mL | Freq: Once | INTRAVENOUS | Status: AC
  Administered 2014-10-05: 500 mL via INTRAVENOUS

## 2014-10-05 MED ORDER — DOXEPIN HCL 10 MG PO CAPS
10.0000 mg | ORAL_CAPSULE | Freq: Once | ORAL | Status: AC
  Administered 2014-10-05: 10 mg via ORAL
  Filled 2014-10-05: qty 1

## 2014-10-05 NOTE — ED Provider Notes (Signed)
CSN: [id number]     Arrival date & time 10/05/14  2038 History   First MD Initiated Contact with Patient 10/05/14 2108     Chief Complaint  Patient presents with  . Fatigue     (Consider location/radiation/quality/duration/timing/severity/associated sxs/prior Treatment) HPI  Pt presenting with increasing fatigue, vomiting and skin itching.  She states symptoms have been present and consistently worsening for the past month.  Today she had 3 episodes of emesis. No diarrhea.  No abdominal pain, no difficulty breathing.  No fever/chills.  She has had poor appetite for several weeks, but over the past few days is not able to keep anything down by mouth due to vomiting.  She is currently on break from chemo for 6 months.  Follows with cancer center.  There are no other associated systemic symptoms, there are no other alleviating or modifying factors.   She has tried benadryl and zyrtec for her itching without relief.    Past Medical History  Diagnosis Date  . AAA (abdominal aortic aneurysm) 2011  . Hyperlipidemia   . Hypertension   . Shingles     POST HERPATIC PAIN FROM SHINGLES  . History of acute gouty arthritis   . Kidney cysts   . Anemia     HX OF ANEMIA WITH CHEMO 2008  . Hypothyroidism   . Cancer     Rectal and liver  . Rectal cancer    Past Surgical History  Procedure Laterality Date  . Abdominal hysterectomy  1961    total  . Tonsillectomy  1949  . Bunionectomy Bilateral   . Rectal tumor removed  2008    CANCEROUS  . Abdominal aorta aneurysm repair -stent  2011  . Portacath placement N/A 09/24/2012    Procedure: INSERTION PORT-A-CATH;  Surgeon: Stark Klein, MD;  Location: [facility];  Service: General;  Laterality: N/A;  . Abdominal aortic aneurysm repair    . Hepatic/embolization arteriogram with y-90 radioembolization  10/21/13   Family History  Problem Relation Age of Onset  . Hypertension Mother   . Heart disease Mother   . Heart attack Mother   . Hypertension Father    . Heart disease Father   . Diabetes Father   . Heart attack Father   . Cancer Sister     ovarian  . Diabetes Sister   . Hyperlipidemia Sister   . Hypertension Sister   . Kidney disease Sister   . Cancer Cousin     breast  . Cancer Cousin     breast  . Diabetes Daughter   . Hyperlipidemia Daughter   . Hypertension Daughter    History  Substance Use Topics  . Smoking status: Former Smoker -- 1.00 packs/day for 20 years    Types: Cigarettes    Quit date: 04/04/1988  . Smokeless tobacco: Never Used  . Alcohol Use: 0.0 oz/week    0 Standard drinks or equivalent per week     Comment: RARE   OB History    Gravida Para Term Preterm AB TAB SAB Ectopic Multiple Living   3 3        2      Review of Systems  ROS reviewed and all otherwise negative except for mentioned in HPI    Allergies  Shellfish allergy; Tramadol hcl; Iodine; and Meperidine hcl  Home Medications   Prior to Admission medications   Medication Sig Start Date End Date Taking? Authorizing Provider  cetirizine (ZYRTEC) 10 MG tablet Take 10 mg by mouth 2 (  two) times daily.   Yes Historical Provider, MD  diphenhydrAMINE-zinc acetate (ANTI-ITCH) cream Apply 1 application topically 3 (three) times daily as needed for itching.   Yes Historical Provider, MD  furosemide (LASIX) 20 MG tablet Take 20 mg by mouth every morning.    Yes Historical Provider, MD  irbesartan (AVAPRO) 300 MG tablet Take 300 mg by mouth daily as needed. Pt only take when bp is over 117   Yes Historical Provider, MD  levothyroxine (SYNTHROID, LEVOTHROID) 88 MCG tablet Take 88 mcg by mouth daily before breakfast.   Yes Historical Provider, MD  NONFORMULARY OR COMPOUNDED ITEM Estradiol 0.02% vaginal cream apply one applicator twice weekly intravaginal 09/09/14  Yes Timothy P Fontaine, MD  omeprazole (PRILOSEC) 40 MG capsule Take 40 mg by mouth daily.   Yes Historical Provider, MD  polyvinyl alcohol (LIQUIFILM TEARS) 1.4 % ophthalmic solution Place 1  drop into both eyes 4 (four) times daily as needed for dry eyes.    Yes Historical Provider, MD  prochlorperazine (COMPAZINE) 25 MG suppository Place 25 mg rectally every 12 (twelve) hours as needed for nausea or vomiting.   Yes Historical Provider, MD  capecitabine (XELODA) 500 MG tablet Take 2 tablets by mouth twice daily after meals for 14 days on, then 7 days off. Patient not taking: Reported on 09/10/2014 09/02/14   Wyatt Portela, MD  fexofenadine-pseudoephedrine (ALLEGRA-D ALLERGY & CONGESTION) 180-240 MG per 24 hr tablet Take 1 tablet by mouth daily. Patient not taking: Reported on 10/05/2014 09/10/14   Wyatt Portela, MD  lidocaine-prilocaine (EMLA) cream Apply 1 application topically daily as needed (Applies to port-a-cath.).    Historical Provider, MD   BP 133/71 mmHg  Pulse 98  Temp(Src) 98 F (36.7 C) (Oral)  Resp 18  Ht 5\' 4"  (1.626 m)  Wt 156 lb (70.761 kg)  BMI 26.76 kg/m2  SpO2 96%  Vitals reviewed Physical Exam  Physical Examination: General appearance - alert, well appearing, and in no distress Mental status - alert, oriented to person, place, and time Eyes - no conjunctival injection, positive scleral icterus Mouth - mucous membranes moist, pharynx normal without lesions Chest - clear to auscultation, no wheezes, rales or rhonchi, symmetric air entry Heart - normal rate, regular rhythm, normal S1, S2, no murmurs, rubs, clicks or gallops Abdomen - soft, nontender, nondistended, no masses or organomegaly Neurological - alert, oriented Extremities - peripheral pulses normal, no pedal edema, no clubbing or cyanosis Skin - normal coloration and turgor, no rashes  ED Course  Procedures (including critical care time) Labs Review Labs Reviewed  CBC - Abnormal; Notable for the following:    WBC 11.1 (*)    RBC 3.17 (*)    Hemoglobin 10.1 (*)    HCT 30.6 (*)    RDW 21.7 (*)    All other components within normal limits  COMPREHENSIVE METABOLIC PANEL - Abnormal; Notable for  the following:    Sodium 133 (*)    CO2 15 (*)    Glucose, Bld 122 (*)    BUN 68 (*)    Creatinine, Ser 3.92 (*)    Total Protein 6.4 (*)    Albumin 2.6 (*)    AST 191 (*)    Alkaline Phosphatase 245 (*)    Total Bilirubin 7.2 (*)    GFR calc non Af Amer 10 (*)    GFR calc Af Amer 11 (*)    All other components within normal limits  TROPONIN I  URINALYSIS, ROUTINE W  REFLEX MICROSCOPIC (NOT AT [facility])    Imaging Review Dg Chest 2 View  10/05/2014   CLINICAL DATA:  Acute onset of generalized weakness, rash and vomiting. Current history of stage IV rectal cancer with metastases. Right shoulder pain for 1 week. Initial encounter.  EXAM: CHEST  2 VIEW  COMPARISON:  PET/CT performed 05/30/2014, and chest radiograph performed 09/24/2012  FINDINGS: The lungs are well-aerated. Mild bibasilar atelectasis is noted. There is no evidence of focal opacification, pleural effusion or pneumothorax.  The heart is normal in size; the mediastinal contour is within normal limits. No acute osseous abnormalities are seen. A right-sided chest port is noted ending about the distal SVC. An IVC filter is partially imaged.  IMPRESSION: Mild bibasilar atelectasis noted; lungs otherwise clear.   Electronically Signed   By: Garald Balding M.D.   On: 10/05/2014 22:34     EKG Interpretation   Date/Time:  Sunday October 05 2014 20:48:03 EDT Ventricular Rate:  98 PR Interval:  170 QRS Duration: 114 QT Interval:  355 QTC Calculation: 453 R Axis:   124 Text Interpretation:  Sinus rhythm Probable anteroseptal infarct, old  Nonspecific T abnormalities, inferior leads Since previous tracing t wave  abnormalities are new compared to 2014 Confirmed by [facility]  MD, [place]  [phone]) on 10/05/2014 9:26:56 PM      MDM   Final diagnoses:  Weakness  Renal failure  Elevated bilirubin  Non-intractable vomiting with nausea, vomiting of unspecified type    Pt presenting with c/o vomiting, increased fatigue, also severe itching  under skin.  Pt has liver mets from primary rectal cancer.  Labs reveal acute renal failure- potassium is normal, elevated LFTs, elevated bilirubin which is likely cause of pruritis.  Pt treated with IV fluids.  Mild leukocytosis, CXR reassuring, awaiting urinalysis.  CT abd/pelvis ordered to further evaluate masses/mets.  Pt signed out to Dr. Randal Buba pending urine and abd CT scan.  Will need admission, pt is aware of this plan and agreeable.    Alfonzo Beers, MD 10/05/14 [phone]

## 2014-10-05 NOTE — ED Notes (Signed)
Pt was encouraged to use abthroom for urine sample--- states, "I can't right now".  Pt refused in and out cath---- states, "Give me 15 or 20 minutes".  Pt was given soda to drink.

## 2014-10-05 NOTE — ED Notes (Addendum)
Pt presents with c/o generalized weakness, pruritis and emesis x 3 today. Pt is on a break from oral chemo, stage IV rectal with mets. Pt c/o R shoulder pain x 1 week. Pt does have power port in place.

## 2014-10-06 ENCOUNTER — Emergency Department (HOSPITAL_COMMUNITY): Payer: Medicare Other

## 2014-10-06 ENCOUNTER — Encounter (HOSPITAL_COMMUNITY): Payer: Self-pay | Admitting: Rehabilitation

## 2014-10-06 DIAGNOSIS — R17 Unspecified jaundice: Secondary | ICD-10-CM | POA: Diagnosis not present

## 2014-10-06 DIAGNOSIS — Z515 Encounter for palliative care: Secondary | ICD-10-CM | POA: Diagnosis not present

## 2014-10-06 DIAGNOSIS — Z91013 Allergy to seafood: Secondary | ICD-10-CM | POA: Diagnosis not present

## 2014-10-06 DIAGNOSIS — K831 Obstruction of bile duct: Secondary | ICD-10-CM | POA: Diagnosis present

## 2014-10-06 DIAGNOSIS — N179 Acute kidney failure, unspecified: Secondary | ICD-10-CM | POA: Diagnosis present

## 2014-10-06 DIAGNOSIS — K838 Other specified diseases of biliary tract: Secondary | ICD-10-CM | POA: Diagnosis not present

## 2014-10-06 DIAGNOSIS — D72829 Elevated white blood cell count, unspecified: Secondary | ICD-10-CM | POA: Diagnosis present

## 2014-10-06 DIAGNOSIS — Z90710 Acquired absence of both cervix and uterus: Secondary | ICD-10-CM | POA: Diagnosis not present

## 2014-10-06 DIAGNOSIS — R531 Weakness: Secondary | ICD-10-CM | POA: Diagnosis not present

## 2014-10-06 DIAGNOSIS — R112 Nausea with vomiting, unspecified: Secondary | ICD-10-CM | POA: Insufficient documentation

## 2014-10-06 DIAGNOSIS — E039 Hypothyroidism, unspecified: Secondary | ICD-10-CM | POA: Diagnosis present

## 2014-10-06 DIAGNOSIS — Z87891 Personal history of nicotine dependence: Secondary | ICD-10-CM | POA: Diagnosis not present

## 2014-10-06 DIAGNOSIS — C801 Malignant (primary) neoplasm, unspecified: Secondary | ICD-10-CM | POA: Diagnosis present

## 2014-10-06 DIAGNOSIS — C787 Secondary malignant neoplasm of liver and intrahepatic bile duct: Secondary | ICD-10-CM

## 2014-10-06 DIAGNOSIS — C20 Malignant neoplasm of rectum: Secondary | ICD-10-CM | POA: Diagnosis present

## 2014-10-06 DIAGNOSIS — Z66 Do not resuscitate: Secondary | ICD-10-CM | POA: Diagnosis present

## 2014-10-06 DIAGNOSIS — E785 Hyperlipidemia, unspecified: Secondary | ICD-10-CM | POA: Diagnosis present

## 2014-10-06 DIAGNOSIS — Z8249 Family history of ischemic heart disease and other diseases of the circulatory system: Secondary | ICD-10-CM | POA: Diagnosis not present

## 2014-10-06 DIAGNOSIS — Z809 Family history of malignant neoplasm, unspecified: Secondary | ICD-10-CM | POA: Diagnosis not present

## 2014-10-06 DIAGNOSIS — E875 Hyperkalemia: Secondary | ICD-10-CM | POA: Diagnosis present

## 2014-10-06 DIAGNOSIS — E860 Dehydration: Secondary | ICD-10-CM | POA: Diagnosis present

## 2014-10-06 DIAGNOSIS — G9340 Encephalopathy, unspecified: Secondary | ICD-10-CM | POA: Diagnosis not present

## 2014-10-06 DIAGNOSIS — Z833 Family history of diabetes mellitus: Secondary | ICD-10-CM | POA: Diagnosis not present

## 2014-10-06 DIAGNOSIS — G893 Neoplasm related pain (acute) (chronic): Secondary | ICD-10-CM | POA: Diagnosis not present

## 2014-10-06 DIAGNOSIS — I129 Hypertensive chronic kidney disease with stage 1 through stage 4 chronic kidney disease, or unspecified chronic kidney disease: Secondary | ICD-10-CM | POA: Diagnosis present

## 2014-10-06 DIAGNOSIS — N19 Unspecified kidney failure: Secondary | ICD-10-CM | POA: Diagnosis not present

## 2014-10-06 DIAGNOSIS — N183 Chronic kidney disease, stage 3 (moderate): Secondary | ICD-10-CM | POA: Diagnosis present

## 2014-10-06 DIAGNOSIS — Z9221 Personal history of antineoplastic chemotherapy: Secondary | ICD-10-CM | POA: Diagnosis not present

## 2014-10-06 DIAGNOSIS — R627 Adult failure to thrive: Secondary | ICD-10-CM | POA: Diagnosis present

## 2014-10-06 LAB — BASIC METABOLIC PANEL
Anion gap: 12 (ref 5–15)
BUN: 64 mg/dL — ABNORMAL HIGH (ref 6–20)
CHLORIDE: 108 mmol/L (ref 101–111)
CO2: 15 mmol/L — AB (ref 22–32)
Calcium: 8.7 mg/dL — ABNORMAL LOW (ref 8.9–10.3)
Creatinine, Ser: 3.78 mg/dL — ABNORMAL HIGH (ref 0.44–1.00)
GFR calc Af Amer: 12 mL/min — ABNORMAL LOW (ref >60)
GFR calc non Af Amer: 10 mL/min — ABNORMAL LOW (ref >60)
Glucose, Bld: 115 mg/dL — ABNORMAL HIGH (ref 65–99)
POTASSIUM: 4.5 mmol/L (ref 3.5–5.1)
Sodium: 135 mmol/L (ref 135–145)

## 2014-10-06 LAB — CBC
HEMATOCRIT: 27.6 % — AB (ref 36.0–46.0)
Hemoglobin: 9.2 g/dL — ABNORMAL LOW (ref 12.0–15.0)
MCH: 32.2 pg (ref 26.0–34.0)
MCHC: 33.3 g/dL (ref 30.0–36.0)
MCV: 96.5 fL (ref 78.0–100.0)
Platelets: 318 10*3/uL (ref 150–400)
RBC: 2.86 MIL/uL — AB (ref 3.87–5.11)
RDW: 21.8 % — ABNORMAL HIGH (ref 11.5–15.5)
WBC: 11.1 10*3/uL — ABNORMAL HIGH (ref 4.0–10.5)

## 2014-10-06 LAB — PROTIME-INR
INR: 1.37 (ref 0.00–1.49)
Prothrombin Time: 17 seconds — ABNORMAL HIGH (ref 11.6–15.2)

## 2014-10-06 MED ORDER — PANTOPRAZOLE SODIUM 40 MG PO TBEC
40.0000 mg | DELAYED_RELEASE_TABLET | Freq: Every day | ORAL | Status: DC
  Administered 2014-10-06 – 2014-10-10 (×5): 40 mg via ORAL
  Filled 2014-10-06 (×7): qty 1

## 2014-10-06 MED ORDER — DEXTROSE 5 % IV SOLN
1.0000 g | Freq: Once | INTRAVENOUS | Status: AC
  Administered 2014-10-06: 1 g via INTRAVENOUS
  Filled 2014-10-06: qty 10

## 2014-10-06 MED ORDER — ONDANSETRON HCL 4 MG/2ML IJ SOLN: 4.0000 mg | Freq: Four times a day (QID) | INTRAMUSCULAR | Status: DC | PRN

## 2014-10-06 MED ORDER — CHOLESTYRAMINE LIGHT 4 G PO PACK
4.0000 g | PACK | ORAL | Status: DC
Start: 2014-10-06 — End: 2014-10-07
  Administered 2014-10-06 – 2014-10-07 (×2): 4 g via ORAL
  Filled 2014-10-06 (×3): qty 1

## 2014-10-06 MED ORDER — LEVOTHYROXINE SODIUM 88 MCG PO TABS
88.0000 ug | ORAL_TABLET | Freq: Every day | ORAL | Status: DC
  Administered 2014-10-06 – 2014-10-10 (×5): 88 ug via ORAL
  Filled 2014-10-06 (×8): qty 1

## 2014-10-06 MED ORDER — SODIUM CHLORIDE 0.9 % IJ SOLN: 10.0000 mL | INTRAMUSCULAR | Status: DC | PRN

## 2014-10-06 MED ORDER — PROCHLORPERAZINE 25 MG RE SUPP
25.0000 mg | Freq: Two times a day (BID) | RECTAL | Status: DC | PRN
  Filled 2014-10-06: qty 1

## 2014-10-06 MED ORDER — SODIUM CHLORIDE 0.9 % IV SOLN
INTRAVENOUS | Status: DC
  Administered 2014-10-06 – 2014-10-09 (×11): via INTRAVENOUS

## 2014-10-06 MED ORDER — PANTOPRAZOLE SODIUM 40 MG PO TBEC: 80.0000 mg | DELAYED_RELEASE_TABLET | Freq: Every day | ORAL | Status: DC

## 2014-10-06 MED ORDER — ONDANSETRON HCL 4 MG/2ML IJ SOLN
4.0000 mg | Freq: Four times a day (QID) | INTRAMUSCULAR | Status: AC
  Administered 2014-10-06 – 2014-10-07 (×4): 4 mg via INTRAVENOUS
  Filled 2014-10-06 (×4): qty 2

## 2014-10-06 MED ORDER — DOXEPIN HCL 10 MG PO CAPS
10.0000 mg | ORAL_CAPSULE | Freq: Every day | ORAL | Status: DC
  Administered 2014-10-06 – 2014-10-09 (×4): 10 mg via ORAL
  Filled 2014-10-06 (×7): qty 1

## 2014-10-06 MED ORDER — SODIUM CHLORIDE 0.9 % IJ SOLN
10.0000 mL | INTRAMUSCULAR | Status: DC | PRN
  Administered 2014-10-07 – 2014-10-10 (×2): 10 mL
  Administered 2014-10-12: 20 mL
  Filled 2014-10-06 (×3): qty 40

## 2014-10-06 MED ORDER — MORPHINE SULFATE 2 MG/ML IJ SOLN
2.0000 mg | INTRAMUSCULAR | Status: DC | PRN
  Administered 2014-10-10 – 2014-10-13 (×3): 4 mg via INTRAVENOUS
  Filled 2014-10-06 (×3): qty 2

## 2014-10-06 MED ORDER — HEPARIN SODIUM (PORCINE) 5000 UNIT/ML IJ SOLN
5000.0000 [IU] | Freq: Three times a day (TID) | INTRAMUSCULAR | Status: DC
  Administered 2014-10-06 – 2014-10-11 (×17): 5000 [IU] via SUBCUTANEOUS
  Filled 2014-10-06 (×22): qty 1

## 2014-10-06 NOTE — Progress Notes (Signed)
Utilization review completed.  

## 2014-10-06 NOTE — Progress Notes (Addendum)
[facility]  Ashley Davenport is an 81 year old female with rectal cancer with liver metastasis who has been having vomiting and epigastric pain for a good 3-4 weeks now. This is caused her to be quite fatigued. She is unable to tolerate liquids or solids and has been chewing on ice. She is also had intense itching which has not improved with Zyrtec she was prescribed as outpatient.  Principal Problem:   Rectal adenocarcinoma metastatic to liver -Off Xeloda for 4 weeks now due to side effects -Plan was to repeat scans next month to see extent of cancer -CT abdomen pelvis performed here reveals increasing cancer burden-she has increased number of hepatic metastasis-majority of the hepatic parenchyma is replaced by the tumor and there is bilateral ductal biliary enlargement-there is a right adrenal nodule as well -palliative care has been consulted  Active Problems:    Obstructive jaundice due to cancer/itching/hyper bilirubinemia -Zyrtec not helping-will discontinue -Doxepin was given last night and helps her -May consider colestipol if itching does not completely resolved but then will need to be watched for worsening of nausea and vomiting, development of constipation and abdominal cramps   Vomiting -Compazine suppositories have not been helping -Zofran given in the hospital is helping-we will continue-advance diet to full liquids as she is tolerating clears  Acute renal failure -Secondary to furosemide, Cozaar and poor oral intake -Furosemide being given for ankle edema-I have told her to use teds instead of furosemide-we'll discontinue  Positive UA -No complaints of dysuria and therefore no need to treat this-she received one dose of Rocephin by the ER which has not been continued  Have asked palliative care to help assist with management of itching and vomiting in hospital and at home  Debbe Odea, MD [phone]

## 2014-10-06 NOTE — H&P (Addendum)
[facility] History and Physical  Ashley Davenport TMH:[id number] DOB: [birth year] DOA: 10/05/2014  Referring physician: EDP PCP: [name], MD   Chief Complaint: N/V, fatigue   HPI: Ashley Davenport is a 81 y.o. female h/o rectal cancer with liver mets, she presents to the ED with c/o epigastric abdominal pain, N/V, and fatigue for the past 4 weeks.  She has had 3 episodes of vomiting today.  Poor appetitive for weeks.  Currently "on break from chemo for 6 months".  Follows with cancer center.  No fevers nor chills, no diarrhea now though has had diarrhea during these 4 weeks.  Review of Systems: Systems reviewed.  As above, otherwise negative  Past Medical History  Diagnosis Date  . AAA (abdominal aortic aneurysm) 2011  . Hyperlipidemia   . Hypertension   . Shingles     POST HERPATIC PAIN FROM SHINGLES  . History of acute gouty arthritis   . Kidney cysts   . Anemia     HX OF ANEMIA WITH CHEMO 2008  . Hypothyroidism   . Cancer     Rectal and liver  . Rectal cancer    Past Surgical History  Procedure Laterality Date  . Abdominal hysterectomy  1961    total  . Tonsillectomy  1949  . Bunionectomy Bilateral   . Rectal tumor removed  2008    CANCEROUS  . Abdominal aorta aneurysm repair -stent  2011  . Portacath placement N/A 09/24/2012    Procedure: INSERTION PORT-A-CATH;  Surgeon: Stark Klein, MD;  Location: [facility];  Service: General;  Laterality: N/A;  . Abdominal aortic aneurysm repair    . Hepatic/embolization arteriogram with y-90 radioembolization  10/21/13   Social History:  reports that she quit smoking about 26 years ago. Her smoking use included Cigarettes. She has a 20 pack-year smoking history. She has never used smokeless tobacco. She reports that she drinks alcohol. She reports that she does not use illicit drugs.  Allergies  Allergen Reactions  . Shellfish Allergy Itching and Rash    Feels itching internally.  . Tramadol Hcl Swelling     REACTION: Tongue and lips swell  . Iodine Itching  . Meperidine Hcl Other (See Comments)    REACTION: Hypotension    Family History  Problem Relation Age of Onset  . Hypertension Mother   . Heart disease Mother   . Heart attack Mother   . Hypertension Father   . Heart disease Father   . Diabetes Father   . Heart attack Father   . Cancer Sister     ovarian  . Diabetes Sister   . Hyperlipidemia Sister   . Hypertension Sister   . Kidney disease Sister   . Cancer Cousin     breast  . Cancer Cousin     breast  . Diabetes Daughter   . Hyperlipidemia Daughter   . Hypertension Daughter      Prior to Admission medications   Medication Sig Start Date End Date Taking? Authorizing Provider  cetirizine (ZYRTEC) 10 MG tablet Take 10 mg by mouth 2 (two) times daily.   Yes Historical Provider, MD  diphenhydrAMINE-zinc acetate (ANTI-ITCH) cream Apply 1 application topically 3 (three) times daily as needed for itching.   Yes Historical Provider, MD  furosemide (LASIX) 20 MG tablet Take 20 mg by mouth every morning.    Yes Historical Provider, MD  irbesartan (AVAPRO) 300 MG tablet Take 300 mg by mouth daily as needed. Pt only take when bp  is over 117   Yes Historical Provider, MD  levothyroxine (SYNTHROID, LEVOTHROID) 88 MCG tablet Take 88 mcg by mouth daily before breakfast.   Yes Historical Provider, MD  NONFORMULARY OR COMPOUNDED ITEM Estradiol 0.02% vaginal cream apply one applicator twice weekly intravaginal 09/09/14  Yes Timothy P Fontaine, MD  omeprazole (PRILOSEC) 40 MG capsule Take 40 mg by mouth daily.   Yes Historical Provider, MD  polyvinyl alcohol (LIQUIFILM TEARS) 1.4 % ophthalmic solution Place 1 drop into both eyes 4 (four) times daily as needed for dry eyes.    Yes Historical Provider, MD  prochlorperazine (COMPAZINE) 25 MG suppository Place 25 mg rectally every 12 (twelve) hours as needed for nausea or vomiting.   Yes Historical Provider, MD  lidocaine-prilocaine (EMLA) cream  Apply 1 application topically daily as needed (Applies to port-a-cath.).    Historical Provider, MD   Physical Exam: Filed Vitals:   10/06/14 0054  BP: 133/76  Pulse: 66  Temp: 97.8 F (36.6 C)  Resp: 18    BP 133/76 mmHg  Pulse 66  Temp(Src) 97.8 F (36.6 C) (Oral)  Resp 18  Ht 5\' 4"  (1.626 m)  Wt 70.761 kg (156 lb)  BMI 26.76 kg/m2  SpO2 98%  General Appearance:    Alert, oriented, no distress, appears stated age  Head:    Normocephalic, atraumatic  Eyes:    PERRL, EOMI, sclera non-icteric        Nose:   Nares without drainage or epistaxis. Mucosa, turbinates normal  Throat:   Moist mucous membranes. Oropharynx without erythema or exudate.  Neck:   Supple. No carotid bruits.  No thyromegaly.  No lymphadenopathy.   Back:     No CVA tenderness, no spinal tenderness  Lungs:     Clear to auscultation bilaterally, without wheezes, rhonchi or rales  Chest wall:    No tenderness to palpitation  Heart:    Regular rate and rhythm without murmurs, gallops, rubs  Abdomen:     Soft, mild epigastric tenderness, nondistended, normal bowel sounds, no organomegaly  Genitalia:    deferred  Rectal:    deferred  Extremities:   No clubbing, cyanosis or edema.  Pulses:   2+ and symmetric all extremities  Skin:   Skin color, texture, turgor normal, no rashes or lesions  Lymph nodes:   Cervical, supraclavicular, and axillary nodes normal  Neurologic:   CNII-XII intact. Normal strength, sensation and reflexes      throughout    Labs on Admission:  Basic Metabolic Panel:  Recent Labs Lab 10/05/14 2141  NA 133*  K 4.5  CL 104  CO2 15*  GLUCOSE 122*  BUN 68*  CREATININE 3.92*  CALCIUM 8.9   Liver Function Tests:  Recent Labs Lab 10/05/14 2141  AST 191*  ALT 45  ALKPHOS 245*  BILITOT 7.2*  PROT 6.4*  ALBUMIN 2.6*   No results for input(s): LIPASE, AMYLASE in the last 168 hours. No results for input(s): AMMONIA in the last 168 hours. CBC:  Recent Labs Lab  10/05/14 2141  WBC 11.1*  HGB 10.1*  HCT 30.6*  MCV 96.5  PLT 308   Cardiac Enzymes:  Recent Labs Lab 10/05/14 2141  TROPONINI 0.03    BNP (last 3 results) No results for input(s): PROBNP in the last 8760 hours. CBG: No results for input(s): GLUCAP in the last 168 hours.  Radiological Exams on Admission: Dg Chest 2 View  10/05/2014   CLINICAL DATA:  Acute onset of generalized weakness, rash  and vomiting. Current history of stage IV rectal cancer with metastases. Right shoulder pain for 1 week. Initial encounter.  EXAM: CHEST  2 VIEW  COMPARISON:  PET/CT performed 05/30/2014, and chest radiograph performed 09/24/2012  FINDINGS: The lungs are well-aerated. Mild bibasilar atelectasis is noted. There is no evidence of focal opacification, pleural effusion or pneumothorax.  The heart is normal in size; the mediastinal contour is within normal limits. No acute osseous abnormalities are seen. A right-sided chest port is noted ending about the distal SVC. An IVC filter is partially imaged.  IMPRESSION: Mild bibasilar atelectasis noted; lungs otherwise clear.   Electronically Signed   By: Garald Balding M.D.   On: 10/05/2014 22:34    EKG: Independently reviewed.  Assessment/Plan Principal Problem:   Rectal adenocarcinoma metastatic to liver Active Problems:   Rectal cancer   Acute renal failure (ARF)   Hyperbilirubinemia   Obstructive jaundice due to cancer   1. Metastatic disease to liver - 1. extensive progression of metastatic disease, this is causing obstructive jaundice, N/V leading to dehydration and ARF 2. IP consult to oncology, patient of Dr. Hazeline Junker 3. Nausea meds 4. Clear liquid diet 5. IVF 6. Pain control with morphine 7. Feel that at this point would be wise to get palliative care involved in this patients care, as she is certainly going down hill over the past month, her metastatic disease in the liver alone is very extensive on CT scan and per radiologist has  progressed.  Majority of her liver has now been replaced by cancer. 2. ARF - 1. hydrate patient with IVF 2. hold lasix and avapro 3. recheck BMP in AM    Code Status: Full Code  Family Communication: Family at bedside Disposition Plan: Admit to inpatient  Time spent: 71 min  Asharia Lotter M. [facility] Pager [phone]  If 7AM-7PM, please contact the day team taking care of the patient [email or web address] Password [email or web address] 10/06/2014, 1:06 AM

## 2014-10-06 NOTE — Consult Note (Signed)
Consultation Note Date: 10/06/2014   Patient Name: Ashley Davenport  DOB: 1931-01-20  MRN: [id number]  Age / Sex: 81 y.o., female   PCP: [name], MD Referring Physician: Debbe Odea, MD  Reason for Consultation: Establishing goals of care  Palliative Care Assessment and Plan Summary of Established Goals of Care and Medical Treatment Preferences   [place] is a 81 y.o. female h/o rectal cancer ( diagnosed in 2008). Her oncologist is Dr Alen Blew. She received 5 FU as well as external radiation therapy. She declined abdominoperitoneal resection, received 3 cycles of Xeloda following which she developed hand-foot syndrome and declined further therapy. She then received FOLFIRI+ Avastin, completed 12 cycles in Dec 2014. Also s/p liver directed therapy in Dec 2015. Since feb 2015, she has been maintained on oral Xeloda. During her last follow up with her oncologist in June 2016, her Xeloda was D/c for side effects of fatigue, foot syndrome. She was to have repeat imaging studies in Aug 2016.  She is now known to have stage IV disease with liver mets.   She presented to the ED on 10-05-14 with c/o epigastric abdominal pain, N/V, and fatigue for the past 4 weeks. She has had 3 episodes of vomiting on day of admission. Poor appetitive for weeks. No fevers nor chills, no diarrhea now though has had diarrhea during these 4 weeks.  Abdominal imaging in this admission shows extensive progression of metastatic disease, this is causing obstructive jaundice, she has been diagnosed with N/V leading to dehydration and ARF. Dr Alen Blew has been consulted, palliative care has been consulted for goals of care discussions.   The patient is resting in bed. 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 Patient complains of itching from her jaundice. Complaints of nausea and vomiting. She complains of not being able to keep food down. She denies any pain. She is having  bowel movements. The patient states she has a living will and healthcare power of attorney agent. She has a son and her daughter. Her husband is deceased. Her daughter is her healthcare agent. Her son has been diagnosed with lung cancer.  Discussed with patient as well as Dr. Wynelle Cleveland. Recommend short term trial 2-3 days of Questran to control the itching coming from hyperbilirubinemia. Could also recommend placement of biliary stent/interventional radiology consultation to help relieve symptoms. Additional input from oncology will be helpful as she is now off Xeloda and has worsening symptoms and imaging evidence of progression. Will also try scheduled Zofran for at least 24 hours for relief from nausea. She has when necessary Zofran available.   Contacts/Participants in Discussion: Primary Decision Maker:    Patient herself. She states I make my own decisions. HCPOA: yes  Patient has designated her daughter Ashley Davenport at [phone] as her healthcare agent. Patient's daughter is currently not present at the bedside.  Code Status/Advance Care Planning:   DO NOT RESUSCITATE/DO NOT INTUBATE discussed with the patient. She clearly states she would not want to be on a ventilator. She does not want shocks does not want chest compressions. She states she has made a living will and these are her above stated wishes. She states her daughter is well aware of her wishes.  Symptom Management:   Questran short trial. Scheduled Zofran for 24 hours.  Palliative Prophylaxis: Yes  Additional Recommendations (Limitations, Scope, Preferences):  Consider addition of hospice services after oncology input. Psycho-social/Spiritual:   Support System: Patient herself  is surprisingly quite independent/functional. She lives with a friend.  Desire for further Chaplaincy support:no  Prognosis: Months  Discharge Planning:  Possibly home likely with hospice or should at least consider inclusion of hospice  services at this stage the cause of increasing symptom burden.    Values: To remain as independent as possible. Patient states she is very realistic about her disease. She wants open/honest conversation. Goals are not currently comfort only. Life limiting illness: Stage IV rectal cancer with liver metastases      Chief Complaint/History of Present Illness: Nausea vomiting  Primary Diagnoses  Present on Admission:  . Acute renal failure (ARF) . Rectal cancer . Hyperbilirubinemia . Obstructive jaundice due to cancer . Rectal adenocarcinoma metastatic to liver  Palliative Review of Systems: noted I have reviewed the medical record, interviewed the patient and family, and examined the patient. The following aspects are pertinent.  Past Medical History  Diagnosis Date  . AAA (abdominal aortic aneurysm) 2011  . Hyperlipidemia   . Hypertension   . Shingles     POST HERPATIC PAIN FROM SHINGLES  . History of acute gouty arthritis   . Kidney cysts   . Anemia     HX OF ANEMIA WITH CHEMO 2008  . Hypothyroidism   . Cancer     Rectal and liver  . Rectal cancer    History   Social History  . Marital Status: Single    Spouse Name: N/A  . Number of Children: N/A  . Years of Education: N/A   Social History Main Topics  . Smoking status: Former Smoker -- 1.00 packs/day for 20 years    Types: Cigarettes    Quit date: 04/04/1988  . Smokeless tobacco: Never Used  . Alcohol Use: 0.0 oz/week    0 Standard drinks or equivalent per week     Comment: RARE  . Drug Use: No  . Sexual Activity: Not Currently     Comment: 1st intercourse 81 yo-Fewer than 5 partners   Other Topics Concern  . None   Social History Narrative   Family History  Problem Relation Age of Onset  . Hypertension Mother   . Heart disease Mother   . Heart attack Mother   . Hypertension Father   . Heart disease Father   . Diabetes Father   . Heart attack Father   . Cancer Sister     ovarian  . Diabetes  Sister   . Hyperlipidemia Sister   . Hypertension Sister   . Kidney disease Sister   . Cancer Cousin     breast  . Cancer Cousin     breast  . Diabetes Daughter   . Hyperlipidemia Daughter   . Hypertension Daughter    Scheduled Meds: . heparin  5,000 Units Subcutaneous 3 times per day  . levothyroxine  88 mcg Oral QAC breakfast  . pantoprazole  40 mg Oral Daily   Continuous Infusions: . sodium chloride 125 mL/hr at 10/06/14 0152   PRN Meds:.morphine injection, ondansetron (ZOFRAN) IV, prochlorperazine, sodium chloride, sodium chloride Medications Prior to Admission:  Prior to Admission medications   Medication Sig Start Date End Date Taking? Authorizing Provider  cetirizine (ZYRTEC) 10 MG tablet Take 10 mg by mouth 2 (two) times daily.   Yes Historical Provider, MD  diphenhydrAMINE-zinc acetate (ANTI-ITCH) cream Apply 1 application topically 3 (three) times daily as needed for itching.   Yes Historical Provider, MD  furosemide (LASIX) 20 MG tablet Take 20 mg by mouth every  morning.    Yes Historical Provider, MD  irbesartan (AVAPRO) 300 MG tablet Take 300 mg by mouth daily as needed. Pt only take when bp is over 117   Yes Historical Provider, MD  levothyroxine (SYNTHROID, LEVOTHROID) 88 MCG tablet Take 88 mcg by mouth daily before breakfast.   Yes Historical Provider, MD  NONFORMULARY OR COMPOUNDED ITEM Estradiol 0.02% vaginal cream apply one applicator twice weekly intravaginal 09/09/14  Yes Timothy P Fontaine, MD  omeprazole (PRILOSEC) 40 MG capsule Take 40 mg by mouth daily.   Yes Historical Provider, MD  polyvinyl alcohol (LIQUIFILM TEARS) 1.4 % ophthalmic solution Place 1 drop into both eyes 4 (four) times daily as needed for dry eyes.    Yes Historical Provider, MD  prochlorperazine (COMPAZINE) 25 MG suppository Place 25 mg rectally every 12 (twelve) hours as needed for nausea or vomiting.   Yes Historical Provider, MD  lidocaine-prilocaine (EMLA) cream Apply 1 application  topically daily as needed (Applies to port-a-cath.).    Historical Provider, MD   Allergies  Allergen Reactions  . Shellfish Allergy Itching and Rash    Feels itching internally.  . Tramadol Hcl Swelling    REACTION: Tongue and lips swell  . Iodine Itching  . Meperidine Hcl Other (See Comments)    REACTION: Hypotension   CBC:    Component Value Date/Time   WBC 11.1* 10/05/2014 2141   WBC 7.3 09/10/2014 0953   HGB 10.1* 10/05/2014 2141   HGB 8.7* 09/10/2014 0953   HCT 30.6* 10/05/2014 2141   HCT 26.6* 09/10/2014 0953   PLT 308 10/05/2014 2141   PLT 232 09/10/2014 0953   MCV 96.5 10/05/2014 2141   MCV 99.3 09/10/2014 0953   NEUTROABS 6.1 09/10/2014 0953   NEUTROABS 5.4 10/21/2013 1146   LYMPHSABS 0.5* 09/10/2014 0953   LYMPHSABS 0.8 10/21/2013 1146   MONOABS 0.6 09/10/2014 0953   MONOABS 0.3 10/21/2013 1146   EOSABS 0.1 09/10/2014 0953   EOSABS 0.2 10/21/2013 1146   BASOSABS 0.0 09/10/2014 0953   BASOSABS 0.0 10/21/2013 1146   Comprehensive Metabolic Panel:    Component Value Date/Time   NA 133* 10/05/2014 2141   NA 134* 09/10/2014 0954   K 4.5 10/05/2014 2141   K 3.6 09/10/2014 0954   CL 104 10/05/2014 2141   CL 102 09/25/2012 0933   CO2 15* 10/05/2014 2141   CO2 17* 09/10/2014 0954   BUN 68* 10/05/2014 2141   BUN 33.0* 09/10/2014 0954   CREATININE 3.92* 10/05/2014 2141   CREATININE 2.0* 09/10/2014 0954   GLUCOSE 122* 10/05/2014 2141   GLUCOSE 177* 09/10/2014 0954   GLUCOSE 137* 09/25/2012 0933   CALCIUM 8.9 10/05/2014 2141   CALCIUM 8.6 09/10/2014 0954   AST 191* 10/05/2014 2141   AST 130* 09/10/2014 0954   ALT 45 10/05/2014 2141   ALT 31 09/10/2014 0954   ALKPHOS 245* 10/05/2014 2141   ALKPHOS 213* 09/10/2014 0954   BILITOT 7.2* 10/05/2014 2141   BILITOT 1.45* 09/10/2014 0954   PROT 6.4* 10/05/2014 2141   PROT 6.2* 09/10/2014 0954   ALBUMIN 2.6* 10/05/2014 2141   ALBUMIN 2.5* 09/10/2014 0954    Physical Exam: Vital Signs: BP 136/62 mmHg  Pulse  76  Temp(Src) 97.5 F (36.4 C) (Oral)  Resp 18  Ht 5\' 4"  (1.626 m)  Wt 74.5 kg (164 lb 3.9 oz)  BMI 28.18 kg/m2  SpO2 100% SpO2: SpO2: 100 % O2 Device: O2 Device: Not Delivered O2 Flow Rate:   Intake/output summary:  Intake/Output Summary (Last 24 hours) at 10/06/14 0757 Last data filed at 10/06/14 0520  Gross per 24 hour  Intake   1000 ml  Output    100 ml  Net    900 ml   LBM:   Baseline Weight: Weight: 70.761 kg (156 lb) Most recent weight: Weight: 74.5 kg (164 lb 3.9 oz)  Exam Findings:  Appropriate appearing lady resting in bed sitting up Lungs clear to auscultation Heart a regular Abdomen soft and nondistended Extremities no edema Nonfocal                       Palliative Performance Scale: 60% Additional Data Reviewed: Recent Labs     10/05/14  2141  WBC  11.1*  HGB  10.1*  PLT  308  NA  133*  BUN  68*  CREATININE  3.92*     Time In: 1100 Time Out: 1155 Time Total: 55 minutes Greater than 50%  of this time was spent counseling and coordinating care related to the above assessment and plan.  Signed by: Loistine Chance, MD [id number] Loistine Chance, MD  10/06/2014, 7:57 AM  Please contact Palliative Medicine Team phone at [phone] for questions and concerns.

## 2014-10-06 NOTE — Progress Notes (Signed)
Received phone call from patient's daughter Mindi Curling at [phone].  Met with patient's daughter in patient's room.   Discussed patient's current conditions, and her current hospitalization. Discussed CT findings from current admission.   Advised follow up with Dr Alen Blew shortly after this hospitalization. Discussed with daughter that the patient has established DNR DNI, daughter is agreeable.   25 minutes spent. All questions answered to the best of my ability.   Loistine Chance, MD [facility]

## 2014-10-07 ENCOUNTER — Telehealth: Payer: Self-pay | Admitting: *Deleted

## 2014-10-07 DIAGNOSIS — N19 Unspecified kidney failure: Secondary | ICD-10-CM

## 2014-10-07 DIAGNOSIS — K831 Obstruction of bile duct: Secondary | ICD-10-CM

## 2014-10-07 DIAGNOSIS — G893 Neoplasm related pain (acute) (chronic): Secondary | ICD-10-CM

## 2014-10-07 DIAGNOSIS — C20 Malignant neoplasm of rectum: Secondary | ICD-10-CM

## 2014-10-07 DIAGNOSIS — C787 Secondary malignant neoplasm of liver and intrahepatic bile duct: Principal | ICD-10-CM

## 2014-10-07 LAB — BASIC METABOLIC PANEL
Anion gap: 11 (ref 5–15)
Anion gap: 10 (ref 5–15)
BUN: 61 mg/dL — AB (ref 6–20)
BUN: 64 mg/dL — AB (ref 6–20)
CALCIUM: 8.5 mg/dL — AB (ref 8.9–10.3)
CHLORIDE: 110 mmol/L (ref 101–111)
CO2: 13 mmol/L — ABNORMAL LOW (ref 22–32)
CO2: 14 mmol/L — ABNORMAL LOW (ref 22–32)
CREATININE: 3.48 mg/dL — AB (ref 0.44–1.00)
Calcium: 8.5 mg/dL — ABNORMAL LOW (ref 8.9–10.3)
Chloride: 113 mmol/L — ABNORMAL HIGH (ref 101–111)
Creatinine, Ser: 3.34 mg/dL — ABNORMAL HIGH (ref 0.44–1.00)
GFR, EST AFRICAN AMERICAN: 14 mL/min — AB (ref >60)
GFR, EST AFRICAN AMERICAN: 13 mL/min — AB (ref >60)
GFR, EST NON AFRICAN AMERICAN: 12 mL/min — AB (ref >60)
GFR, EST NON AFRICAN AMERICAN: 11 mL/min — AB (ref >60)
Glucose, Bld: 99 mg/dL (ref 65–99)
Glucose, Bld: 97 mg/dL (ref 65–99)
POTASSIUM: 5.2 mmol/L — AB (ref 3.5–5.1)
POTASSIUM: 4.7 mmol/L (ref 3.5–5.1)
SODIUM: 134 mmol/L — AB (ref 135–145)
Sodium: 137 mmol/L (ref 135–145)

## 2014-10-07 LAB — HEPATIC FUNCTION PANEL
ALBUMIN: 2.1 g/dL — AB (ref 3.5–5.0)
ALT: 40 U/L (ref 14–54)
AST: 199 U/L — AB (ref 15–41)
Alkaline Phosphatase: 214 U/L — ABNORMAL HIGH (ref 38–126)
BILIRUBIN DIRECT: 4.2 mg/dL — AB (ref 0.1–0.5)
BILIRUBIN TOTAL: 7.6 mg/dL — AB (ref 0.3–1.2)
Indirect Bilirubin: 3.4 mg/dL — ABNORMAL HIGH (ref 0.3–0.9)
Total Protein: 5.1 g/dL — ABNORMAL LOW (ref 6.5–8.1)

## 2014-10-07 MED ORDER — SODIUM CHLORIDE 0.9 % IV SOLN
8.0000 mg | Freq: Four times a day (QID) | INTRAVENOUS | Status: DC
  Administered 2014-10-07 – 2014-10-10 (×12): 8 mg via INTRAVENOUS
  Filled 2014-10-07 (×15): qty 4

## 2014-10-07 MED ORDER — HYDROXYZINE HCL 25 MG PO TABS
25.0000 mg | ORAL_TABLET | Freq: Three times a day (TID) | ORAL | Status: DC
  Administered 2014-10-07 – 2014-10-10 (×11): 25 mg via ORAL
  Filled 2014-10-07 (×18): qty 1

## 2014-10-07 MED ORDER — ENSURE ENLIVE PO LIQD
237.0000 mL | Freq: Two times a day (BID) | ORAL | Status: DC
  Administered 2014-10-07 – 2014-10-10 (×5): 237 mL via ORAL

## 2014-10-07 MED ORDER — SODIUM CHLORIDE 0.9 % IV SOLN
8.0000 mg | Freq: Four times a day (QID) | INTRAVENOUS | Status: DC | PRN
  Filled 2014-10-07: qty 4

## 2014-10-07 MED ORDER — DIPHENHYDRAMINE-ZINC ACETATE 2-0.1 % EX CREA
TOPICAL_CREAM | Freq: Three times a day (TID) | CUTANEOUS | Status: DC | PRN
  Administered 2014-10-07: 01:00:00 via TOPICAL
  Filled 2014-10-07: qty 28

## 2014-10-07 NOTE — Progress Notes (Signed)
[facility] Progress Note   Ashley Davenport XHB:[id number] DOB: [birth year] DOA: 10/05/2014 PCP: [name], MD  Brief narrative: Ashley Davenport is a 81 y.o. female with rectal cancer with liver metastasis who has been having vomiting and epigastric pain for a good 3-4 weeks now. This is caused her to be quite fatigued. She is unable to tolerate liquids or solids and has been chewing on ice. She is also had intense itching which has not improved with Zyrtec she was prescribed as outpatient.   Subjective: -Need to have itching this morning-had some vomiting yesterday despite routinely receiving Zofran.  Assessment/Plan: Rectal adenocarcinoma metastatic to liver -Off Xeloda for 4 weeks now due to side effects -Plan was to repeat scans next month to see extent of cancer -CT abdomen pelvis performed here reveals increasing cancer burden-she has increased number of hepatic metastasis-majority of the hepatic parenchyma is replaced by the tumor and there is bilateral ductal biliary enlargement-there is a right adrenal nodule as well -palliative care has been consulted- her oncologist, Dr. Alen Blew has seen her this morning and agrees with this  Active Problems:   Obstructive jaundice due to cancer/itching/hyper bilirubinemia -Zyrtec not helping-discontinued -Doxepin was given -helped slightly -Cholestyramine added-continues to have itching- Will discontinue this as does not appear to be helping and it can worsen her nausea and vomiting and cause constipation -Also has Benadryl cream when necessary -Atarax given this morning which has helped significantly-we'll continue 3 times a day and continue to monitor overnight-as long as itching is controlled on these medications, she should not need a biliary stent-she feels a little sleepy after the Atarax but she states it's tolerable  Vomiting -Compazine suppositories have not been helping -Zofran given in the hospital is helping  a little-we will increase to 8 mg 4 times a day -advance diet to full liquids as she is tolerating clears  Acute renal failure -Possibly ATN and prerenal -Secondary to furosemide, Cozaar and poor oral intake -Furosemide being given for ankle edema-I have told her to use teds instead of furosemide-we'll discontinue -Continue normal saline at 125 mL an hour and continue to follow renal function-has not improved much since yesterday  Hyperkalemia Recheck later today-  Positive UA -No complaints of dysuria and therefore no need to treat this-she received one dose of Rocephin by the ER which has not been continued  Have asked palliative care to help assist with management of itching and vomiting in hospital and at home   Code Status: DO NOT RESUSCITATE  Family Communication:  Disposition Plan: Most likely will go home-is quite independent at home DVT prophylaxis: Heparin due to renal failure  Consultants:Oncology, palliative care  Procedures:  Antibiotics: Anti-infectives    Start     Dose/Rate Route Frequency Ordered Stop   10/06/14 0030  cefTRIAXone (ROCEPHIN) 1 g in dextrose 5 % 50 mL IVPB     1 g 100 mL/hr over 30 Minutes Intravenous  Once 10/06/14 0016 10/06/14 0100      Objective: Filed Weights   10/05/14 2047 10/06/14 0129  Weight: 70.761 kg (156 lb) 74.5 kg (164 lb 3.9 oz)    Intake/Output Summary (Last 24 hours) at 10/07/14 1054 Last data filed at 10/07/14 0852  Gross per 24 hour  Intake 3355.42 ml  Output    600 ml  Net 2755.42 ml     Vitals Filed Vitals:   10/06/14 0518 10/06/14 1327 10/06/14 2103 10/07/14 0537  BP: 136/62 121/61 128/61 104/43  Pulse: 76 80 71  76  Temp: 97.5 F (36.4 C)  97.9 F (36.6 C) 97.6 F (36.4 C)  TempSrc: Oral  Oral Oral  Resp: 18 18 18 18   Height:      Weight:      SpO2: 100% 100% 100% 98%    Exam:  General:  Pt is alert, not in acute distress  HEENT: No icterus, No thrush, oral mucosa moist  Cardiovascular:  regular rate and rhythm, S1/S2 No murmur  Respiratory: clear to auscultation bilaterally   Abdomen: Soft, +Bowel sounds, non tender, non distended, no guarding  MSK: No LE edema, cyanosis or clubbing  Data Reviewed: Basic Metabolic Panel:  Recent Labs Lab 10/05/14 2141 10/06/14 0800 10/07/14 0415  NA 133* 135 134*  K 4.5 4.5 5.2*  CL 104 108 110  CO2 15* 15* 13*  GLUCOSE 122* 115* 99  BUN 68* 64* 61*  CREATININE 3.92* 3.78* 3.34*  CALCIUM 8.9 8.7* 8.5*   Liver Function Tests:  Recent Labs Lab 10/05/14 2141 10/07/14 0415  AST 191* 199*  ALT 45 40  ALKPHOS 245* 214*  BILITOT 7.2* 7.6*  PROT 6.4* 5.1*  ALBUMIN 2.6* 2.1*   No results for input(s): LIPASE, AMYLASE in the last 168 hours. No results for input(s): AMMONIA in the last 168 hours. CBC:  Recent Labs Lab 10/05/14 2141 10/06/14 0800  WBC 11.1* 11.1*  HGB 10.1* 9.2*  HCT 30.6* 27.6*  MCV 96.5 96.5  PLT 308 318   Cardiac Enzymes:  Recent Labs Lab 10/05/14 2141  TROPONINI 0.03   BNP (last 3 results) No results for input(s): BNP in the last 8760 hours.  ProBNP (last 3 results) No results for input(s): PROBNP in the last 8760 hours.  CBG: No results for input(s): GLUCAP in the last 168 hours.  No results found for this or any previous visit (from the past 240 hour(s)).   Studies: Ct Abdomen Pelvis Wo Contrast  10/06/2014   CLINICAL DATA:  Generalized weakness with pruritus and emesis for 3 days.  EXAM: CT ABDOMEN AND PELVIS WITHOUT CONTRAST  TECHNIQUE: Multidetector CT imaging of the abdomen and pelvis was performed following the standard protocol without IV contrast.  COMPARISON:  PET-CT 05/30/2014  FINDINGS: BODY WALL: No contributory findings.  LOWER CHEST: Bilateral pulmonary nodules have increased in size, compatible with metastatic disease. The largest on the right measures 14 mm in the lower lobe on image 10. No pleural fluid.  ABDOMEN/PELVIS:  Liver: Increased number of hepatic metastases.  The majority of the hepatic parenchyma is replaced by tumor. Treatment related parenchymal density in the central liver is stable. Prominent umbilical vein, potentially recannulized and representing chronic compromise of the portal venous system.  Biliary: Bilateral intermittent intraductal biliary enlargement, presumably central obstruction from the metastases.  Pancreas: No acute findings.  Spleen: Unremarkable.  Adrenals: 17 mm nodule in the right adrenal gland, new from prior and consistent with metastatic disease.  Kidneys and ureters: No hydronephrosis or stone. Bilateral renal cysts, including a hemorrhagic cyst on the right.  Bladder: Unremarkable.  Reproductive: Hysterectomy and probable oophorectomies.  Bowel: History of rectal cancer with no evidence of enlarging rectal mass. Extensive colonic diverticulosis without active inflammation. No appendicitis. No bowel obstruction or inflammatory wall thickening.  Retroperitoneum: New adenopathy in the upper abdominal ligaments, including porta hepatis, lower paraesophageal and gastrohepatic ligament. Gastrohepatic ligament nodes measure up to 16 mm short axis. The portacaval node measures 19 mm in short axis.  Peritoneum: Increase and pelvic ascites which is low-density and  non loculated. No discrete peritoneal metastasis.  Vascular: Aortic aneurysm status post aorto bi-iliac stent grafting. No acute findings.  OSSEOUS: No acute finding or metastatic disease.  IMPRESSION: 1. Progression of pulmonary, hepatic, adrenal, and retroperitoneal nodal metastatic disease. 2. Scattered intrahepatic biliary duct dilatation/obstruction from #1. 3. Small but increased ascites without discrete peritoneal nodule.   Electronically Signed   By: Monte Fantasia M.D.   On: 10/06/2014 01:06   Dg Chest 2 View  10/05/2014   CLINICAL DATA:  Acute onset of generalized weakness, rash and vomiting. Current history of stage IV rectal cancer with metastases. Right shoulder pain for 1  week. Initial encounter.  EXAM: CHEST  2 VIEW  COMPARISON:  PET/CT performed 05/30/2014, and chest radiograph performed 09/24/2012  FINDINGS: The lungs are well-aerated. Mild bibasilar atelectasis is noted. There is no evidence of focal opacification, pleural effusion or pneumothorax.  The heart is normal in size; the mediastinal contour is within normal limits. No acute osseous abnormalities are seen. A right-sided chest port is noted ending about the distal SVC. An IVC filter is partially imaged.  IMPRESSION: Mild bibasilar atelectasis noted; lungs otherwise clear.   Electronically Signed   By: Garald Balding M.D.   On: 10/05/2014 22:34    Scheduled Meds:  Scheduled Meds: . cholestyramine light  4 g Oral 2 times per day  . doxepin  10 mg Oral QHS  . heparin  5,000 Units Subcutaneous 3 times per day  . hydrOXYzine  25 mg Oral TID  . levothyroxine  88 mcg Oral QAC breakfast  . pantoprazole  40 mg Oral Daily   Continuous Infusions: . sodium chloride 125 mL/hr at 10/07/14 1026    Time spent on care of this patient: 35  min   [place], MD 10/07/2014, 10:54 AM  LOS: 1 day   [facility] Office  [phone] Pager - Text Page per [email or web address] If 7PM-7AM, please contact night-coverage [email or web address]

## 2014-10-07 NOTE — Progress Notes (Signed)
Initial Nutrition Assessment  DOCUMENTATION CODES:  Not applicable  INTERVENTION: - Will order Ensure Enlive BID, each supplement provides 350 kcal and 20 grams of protein - RD will continue to monitor for needs  NUTRITION DIAGNOSIS:  Inadequate oral intake related to chronic illness as evidenced by per patient/family report.  GOAL:  Patient will meet greater than or equal to 90% of their needs  MONITOR:  Diet advancement, PO intake, Supplement acceptance, Weight trends, Labs  REASON FOR ASSESSMENT:  Malnutrition Screening Tool  ASSESSMENT: 81 y.o. female h/o rectal cancer with liver mets, she presents to the ED with c/o epigastric abdominal pain, N/V, and fatigue for the past 4 weeks. She has had 3 episodes of vomiting today. Poor appetitive for weeks. Currently "on break from chemo for 6 months". Follows with cancer center.   Pt seen for MST. BMI indicates overweight status. Pt had some jello and broth for breakfast which she states she tolerated well with no vomiting. Diet was advanced to FLD from CLD at 10:57 AM this AM; pt reports she wants tomato soup. She and family member report that pt has had nausea and vomiting with intakes for the past 2-3 weeks. Nothing has been better or worse during this time concerning POs. Prior to this, pt was drinking Ensure and she is interested in receiving it here as she feels nausea and vomiting are now better controlled.   Pt indicates that over the past 3 years she has lost 30 lbs. Per weight hx review, pt has lost 9 lbs (5% body weight) in the past 8 months which is not significant for time frame. No muscle or fat wasting noted; mild edema to bilateral feet/ankles which pt reports is usual for her.  Not meeting needs. Medications reviewed. Labs reviewed; Na: 134 mmol/L, K: 5.2 mmol/L, BUN/creatinine elevated but trending down, Ca: 8.5 mg/dL, GFR: 14.  Height:  Ht Readings from Last 1 Encounters:  10/06/14 5\' 4"  (1.626 m)     Weight:  Wt Readings from Last 1 Encounters:  10/06/14 164 lb 3.9 oz (74.5 kg)    Ideal Body Weight:  54.54 kg (kg)  Wt Readings from Last 10 Encounters:  10/06/14 164 lb 3.9 oz (74.5 kg)  09/10/14 159 lb 4.8 oz (72.258 kg)  08/05/14 162 lb (73.483 kg)  08/01/14 163 lb 14.4 oz (74.345 kg)  07/22/14 162 lb 12.8 oz (73.846 kg)  06/04/14 168 lb 4.8 oz (76.34 kg)  04/29/14 169 lb 6.4 oz (76.839 kg)  03/18/14 172 lb (78.019 kg)  03/11/14 170 lb (77.111 kg)  03/07/14 173 lb 1.6 oz (78.518 kg)    BMI:  Body mass index is 28.18 kg/(m^2).  Estimated Nutritional Needs:  Kcal:  0923-3007  Protein:  75-85 grams  Fluid:  2.2 L/day  Skin:  Reviewed, no issues  Diet Order:  Diet full liquid Room service appropriate?: Yes; Fluid consistency:: Thin  EDUCATION NEEDS:  No education needs identified at this time   Intake/Output Summary (Last 24 hours) at 10/07/14 1227 Last data filed at 10/07/14 0852  Gross per 24 hour  Intake 3355.42 ml  Output    600 ml  Net 2755.42 ml    Last BM:  7/4     Jarome Matin, RD, LDN Inpatient Clinical Dietitian Pager # [phone] After hours/weekend pager # [phone]

## 2014-10-07 NOTE — Telephone Encounter (Signed)
BRENDA BYNUM IS PT.'S DAUGHTER WHO LIVES OUT OF TOWN. SHE WOULD LIKE A CALL FROM DR.SHADAD WITH AN UPDATE ON HER MOTHER.

## 2014-10-07 NOTE — Progress Notes (Signed)
IP PROGRESS NOTE  Subjective:   81 year old woman known to me with a long-standing history of advance colorectal cancer. She presented with symptoms of abdominal pain, dehydration and failure to thrive. She has not been able to keep food down in the last few weeks. CT scan of the abdomen and pelvis was reviewed and showed progression of her cancer explaining her failure to thrive. She was hospitalized overnight and required IV hydration and symptomatic management.  She feels slightly better. Her pain is controlled but still feel very weak and have not really had much to eat.  Objective:  Vital signs in last 24 hours: Temp:  [97.6 F (36.4 C)-97.9 F (36.6 C)] 97.6 F (36.4 C) (07/05 0537) Pulse Rate:  [71-80] 76 (07/05 0537) Resp:  [18] 18 (07/05 0537) BP: (104-128)/(43-61) 104/43 mmHg (07/05 0537) SpO2:  [98 %-100 %] 98 % (07/05 0537) Weight change:     Intake/Output from previous day: 07/04 0701 - 07/05 0700 In: 3495.4 [P.O.:600; I.V.:2895.4] Out: 650 [Urine:650]  Mouth: mucous membranes moist, pharynx normal without lesions Resp: clear to auscultation bilaterally Cardio: regular rate and rhythm, S1, S2 normal, no murmur, click, rub or gallop GI: soft, non-tender; bowel sounds normal; no masses,  no organomegaly Extremities: extremities normal, atraumatic, no cyanosis or edema  Portacath/PICC-without erythema  Lab Results:  Recent Labs  10/05/14 2141 10/06/14 0800  WBC 11.1* 11.1*  HGB 10.1* 9.2*  HCT 30.6* 27.6*  PLT 308 318    BMET  Recent Labs  10/06/14 0800 10/07/14 0415  NA 135 134*  K 4.5 5.2*  CL 108 110  CO2 15* 13*  GLUCOSE 115* 99  BUN 64* 61*  CREATININE 3.78* 3.34*  CALCIUM 8.7* 8.5*    Studies/Results: Ct Abdomen Pelvis Wo Contrast  10/06/2014   CLINICAL DATA:  Generalized weakness with pruritus and emesis for 3 days.  EXAM: CT ABDOMEN AND PELVIS WITHOUT CONTRAST  TECHNIQUE: Multidetector CT imaging of the abdomen and pelvis was performed  following the standard protocol without IV contrast.  COMPARISON:  PET-CT 05/30/2014  FINDINGS: BODY WALL: No contributory findings.  LOWER CHEST: Bilateral pulmonary nodules have increased in size, compatible with metastatic disease. The largest on the right measures 14 mm in the lower lobe on image 10. No pleural fluid.  ABDOMEN/PELVIS:  Liver: Increased number of hepatic metastases. The majority of the hepatic parenchyma is replaced by tumor. Treatment related parenchymal density in the central liver is stable. Prominent umbilical vein, potentially recannulized and representing chronic compromise of the portal venous system.  Biliary: Bilateral intermittent intraductal biliary enlargement, presumably central obstruction from the metastases.  Pancreas: No acute findings.  Spleen: Unremarkable.  Adrenals: 17 mm nodule in the right adrenal gland, new from prior and consistent with metastatic disease.  Kidneys and ureters: No hydronephrosis or stone. Bilateral renal cysts, including a hemorrhagic cyst on the right.  Bladder: Unremarkable.  Reproductive: Hysterectomy and probable oophorectomies.  Bowel: History of rectal cancer with no evidence of enlarging rectal mass. Extensive colonic diverticulosis without active inflammation. No appendicitis. No bowel obstruction or inflammatory wall thickening.  Retroperitoneum: New adenopathy in the upper abdominal ligaments, including porta hepatis, lower paraesophageal and gastrohepatic ligament. Gastrohepatic ligament nodes measure up to 16 mm short axis. The portacaval node measures 19 mm in short axis.  Peritoneum: Increase and pelvic ascites which is low-density and non loculated. No discrete peritoneal metastasis.  Vascular: Aortic aneurysm status post aorto bi-iliac stent grafting. No acute findings.  OSSEOUS: No acute finding or metastatic  disease.  IMPRESSION: 1. Progression of pulmonary, hepatic, adrenal, and retroperitoneal nodal metastatic disease. 2. Scattered  intrahepatic biliary duct dilatation/obstruction from #1. 3. Small but increased ascites without discrete peritoneal nodule.   Electronically Signed   By: Monte Fantasia M.D.   On: 10/06/2014 01:06   Dg Chest 2 View  10/05/2014   CLINICAL DATA:  Acute onset of generalized weakness, rash and vomiting. Current history of stage IV rectal cancer with metastases. Right shoulder pain for 1 week. Initial encounter.  EXAM: CHEST  2 VIEW  COMPARISON:  PET/CT performed 05/30/2014, and chest radiograph performed 09/24/2012  FINDINGS: The lungs are well-aerated. Mild bibasilar atelectasis is noted. There is no evidence of focal opacification, pleural effusion or pneumothorax.  The heart is normal in size; the mediastinal contour is within normal limits. No acute osseous abnormalities are seen. A right-sided chest port is noted ending about the distal SVC. An IVC filter is partially imaged.  IMPRESSION: Mild bibasilar atelectasis noted; lungs otherwise clear.   Electronically Signed   By: Garald Balding M.D.   On: 10/05/2014 22:34    Medications: I have reviewed the patient's current medications.  Assessment/Plan:  81 year old woman with the following issues:  1. Stage IV colon cancer documented first time in June 2014. Initial colon cancer diagnosed stage back to 2008. She had multiple treatments including systemic chemotherapy, liver directed therapy and/or chemotherapy. Most recently she developed progression of disease documented by CT scan on 10/06/2014 that was reviewed today. These findings were reviewed with the patient today and I agree with her decision. General elected to proceed with supportive care only and I think that some reasonable option. I do not think multi agent systemic chemotherapy and offer much palliation and I do not think she is able to tolerate it.  You have tolerated chemotherapy poorly in the past and is unlikely to help her moving forward.  I agree with palliative and supportive  care only at this time.  2. Abdominal pain: Related to her colon cancer and I agree with the pain management approach.  3. Acute on chronic renal failure: Likely prerenal in nature her creatinine trending in the right direction.  4. Obstructive jaundice: Related to her colon cancer. It is unclear whether biliary decompression would offer much palliation at this time or if it's even feasible. An input from gastroenterology for interventional radiology would be helpful.  5. Prognosis: Very poor with limited life expectancy and I believe she qualifies for hospice upon her discharge.  I appreciate the care from the hospice team as well as the input from the palliative medicine service.     LOS: 1 day   [facility] 10/07/2014, 7:57 AM

## 2014-10-08 ENCOUNTER — Telehealth: Payer: Self-pay | Admitting: *Deleted

## 2014-10-08 DIAGNOSIS — N179 Acute kidney failure, unspecified: Secondary | ICD-10-CM

## 2014-10-08 DIAGNOSIS — K838 Other specified diseases of biliary tract: Secondary | ICD-10-CM

## 2014-10-08 DIAGNOSIS — R17 Unspecified jaundice: Secondary | ICD-10-CM

## 2014-10-08 DIAGNOSIS — R112 Nausea with vomiting, unspecified: Secondary | ICD-10-CM

## 2014-10-08 DIAGNOSIS — K831 Obstruction of bile duct: Secondary | ICD-10-CM | POA: Insufficient documentation

## 2014-10-08 NOTE — Progress Notes (Signed)
Patient ID: Ashley Davenport, female   DOB: July 02, 1930, 81 y.o.   MRN: [id number] I have reviewed the chart/imaging. The patient has advanced colorectal CA wth liver mets, and now with increasing bilirubin. Bili is only 7, and the patient is not septic. Obstruction and tumor is widespread throughout liver therefore, the benefit of percutaneous drainage would be minimal with a high morbidity rate. PTC and biliary drainage is not indicated at this time. I agree with palliative and supportive care.

## 2014-10-08 NOTE — Telephone Encounter (Signed)
"  Someone called and hung up before I could get to the phone.  I don't know if you're calling about my nephew Ashley Davenport or his mother who's over there.  I'll just wait for them to call back."  Informed her no one called but then observed this note.

## 2014-10-08 NOTE — Treatment Plan (Signed)
Discussed case with IR who reviewed labs and imaging. Given tumor burden involving liver, percutaneous biliary stenting is not feasible. Recs instead for focus on palliation per IR.

## 2014-10-08 NOTE — Progress Notes (Signed)
IP PROGRESS NOTE  Subjective:   Ms. Nest feels better this morning. Her pain is controlled and she is able to eat and keep food down. Has not reported any vomiting or sickness. She has continued to be weak and fatigued and needs assistance with ambulation.  Objective:  Vital signs in last 24 hours: Temp:  [97.4 F (36.3 C)-98.3 F (36.8 C)] 98.1 F (36.7 C) (07/06 0515) Pulse Rate:  [72-84] 84 (07/06 0515) Resp:  [18-20] 20 (07/06 0515) BP: (123-125)/(57-64) 125/64 mmHg (07/06 0515) SpO2:  [97 %-100 %] 97 % (07/06 0515) Weight change:  Last BM Date: 10/07/14 (per patient. )  Intake/Output from previous day: 07/05 0701 - 07/06 0700 In: 1469 [P.O.:540; I.V.:875; IV Piggyback:54] Out: 375 [Urine:375] Alert and awake woman chronically ill-appearing. Appeared comfortable this morning Mouth: mucous membranes moist, pharynx normal without lesions Resp: clear to auscultation bilaterally Cardio: regular rate and rhythm, S1, S2 normal, no murmur, click, rub or gallop GI: soft, non-tender; bowel sounds normal; no masses,  no organomegaly Extremities: extremities normal, atraumatic, no cyanosis or edema  Portacath without erythema  Lab Results:  Recent Labs  10/05/14 2141 10/06/14 0800  WBC 11.1* 11.1*  HGB 10.1* 9.2*  HCT 30.6* 27.6*  PLT 308 318    BMET  Recent Labs  10/07/14 0415 10/07/14 1220  NA 134* 137  K 5.2* 4.7  CL 110 113*  CO2 13* 14*  GLUCOSE 99 97  BUN 61* 64*  CREATININE 3.34* 3.48*  CALCIUM 8.5* 8.5*    Studies/Results: No results found.  Medications: I have reviewed the patient's current medications.  Assessment/Plan:  81 year old woman with the following issues:  1. Stage IV colon cancer documented first time in June 2014. Initial colon cancer diagnosed stage back to 2008. She had multiple treatments including systemic chemotherapy, liver directed therapy and/or chemotherapy. Most recently she developed progression of disease documented  by CT scan on 10/06/2014.   No further systemic therapy is planned and the plan is to proceed with supportive care only.  2. Abdominal pain: Related to her colon cancer and I agree with the pain management approach. Her pain seems to be improved  3. Acute on chronic renal failure: Creatinine is relatively stable.  4. Obstructive jaundice: Related to her colon cancer. It is unclear whether biliary decompression would offer much palliation at this time or if it's even feasible. An input from gastroenterology for interventional radiology would be helpful.  5. Prognosis: Very poor with limited life expectancy and I believe she qualifies for hospice upon her discharge. I attempted to contact her daughter on a few occasions yesterday and I will try to do that again today.  I appreciate the care from the hospice team as well as the input from the palliative medicine service.     LOS: 2 days   [facility] 10/08/2014, 7:57 AM

## 2014-10-08 NOTE — Progress Notes (Signed)
[facility] PROGRESS NOTE  EITHEL RYALL JWJ:[id number] DOB: 13-Oct-1930 DOA: 10/05/2014 PCP: [name], MD  Brief narrative: Ashley Davenport is a 81 y.o. female with rectal cancer with liver metastasis who has been having vomiting and epigastric pain for a good 3-4 weeks now. This is caused her to be quite fatigued. She is unable to tolerate liquids or solids and has been chewing on ice. She is also had intense itching which has not improved with Zyrtec she was prescribed as outpatient.  Assessment/Plan: Rectal adenocarcinoma metastatic to liver -Off Xeloda for 4 weeks now due to side effects -Tentative plan to repeat scans next month to see extent of cancer -CT abdomen pelvis performed here reveals increasing cancer burden with increased number of hepatic metastasis with evidence of bilateral ductal biliary enlargement -there is a right adrenal nodule as well -palliative care was been consulted   Obstructive jaundice due to cancer/itching/hyper bilirubinemia -Zyrtec was not helping and was discontinued -Doxepin was given which helped slightly -No improvement with trial of cholestyramine -Continue PRN Benadryl cream -Improvement noted with atarax -Have consulted IR for consideration for ?biliary stenting  Vomiting -Compazine suppositories have not been helping -Zofran continued PRN -Pt tolerated full liquid diet. Advance to soft diet  Acute renal failure -Consideration for ATN and prerenal renal disease -Secondary to furosemide, Cozaar and poor oral intake, lasix held -Continued on normal saline at 125 mL an hour and continue to follow renal function -no significant improvement overnight  Hyperkalemia - improved  Positive UA -Pt asymptomatic -she received one dose of Rocephin by the ER  Code Status: DNR Family Communication: Pt in room, son at bedside, daughter over phone (indicate person spoken with, relationship, and if by phone, the  number) Disposition Plan: Pending   Consultants:  IR  Oncology  Procedures:    Antibiotics:  Rocephin x 1 dose (indicate start date, and stop date if known)  HPI/Subjective: Feels better today. Denies nausea  Objective: Filed Vitals:   10/07/14 1408 10/07/14 2219 10/08/14 0515 10/08/14 1353  BP: 123/57 123/61 125/64 137/60  Pulse: 72 72 84 71  Temp: 97.4 F (36.3 C) 98.3 F (36.8 C) 98.1 F (36.7 C) 98.5 F (36.9 C)  TempSrc: Oral Oral Oral Oral  Resp: 18 20 20 18   Height:      Weight:      SpO2: 100% 100% 97% 100%    Intake/Output Summary (Last 24 hours) at 10/08/14 1603 Last data filed at 10/07/14 1838  Gross per 24 hour  Intake    200 ml  Output      0 ml  Net    200 ml   Filed Weights   10/05/14 2047 10/06/14 0129  Weight: 70.761 kg (156 lb) 74.5 kg (164 lb 3.9 oz)    Exam:   General:  Awake, in nad  Cardiovascular: regular, s1, s2  Respiratory: normal resp effort, no wheezing  Abdomen: soft,nondistended  Musculoskeletal: perfused, no clubbing   Data Reviewed: Basic Metabolic Panel:  Recent Labs Lab 10/05/14 2141 10/06/14 0800 10/07/14 0415 10/07/14 1220  NA 133* 135 134* 137  K 4.5 4.5 5.2* 4.7  CL 104 108 110 113*  CO2 15* 15* 13* 14*  GLUCOSE 122* 115* 99 97  BUN 68* 64* 61* 64*  CREATININE 3.92* 3.78* 3.34* 3.48*  CALCIUM 8.9 8.7* 8.5* 8.5*   Liver Function Tests:  Recent Labs Lab 10/05/14 2141 10/07/14 0415  AST 191* 199*  ALT 45 40  ALKPHOS 245* 214*  BILITOT 7.2* 7.6*  PROT 6.4* 5.1*  ALBUMIN 2.6* 2.1*   No results for input(s): LIPASE, AMYLASE in the last 168 hours. No results for input(s): AMMONIA in the last 168 hours. CBC:  Recent Labs Lab 10/05/14 2141 10/06/14 0800  WBC 11.1* 11.1*  HGB 10.1* 9.2*  HCT 30.6* 27.6*  MCV 96.5 96.5  PLT 308 318   Cardiac Enzymes:  Recent Labs Lab 10/05/14 2141  TROPONINI 0.03   BNP (last 3 results) No results for input(s): BNP in the last 8760  hours.  ProBNP (last 3 results) No results for input(s): PROBNP in the last 8760 hours.  CBG: No results for input(s): GLUCAP in the last 168 hours.  No results found for this or any previous visit (from the past 240 hour(s)).   Studies: No results found.  Scheduled Meds: . doxepin  10 mg Oral QHS  . feeding supplement (ENSURE ENLIVE)  237 mL Oral BID BM  . heparin  5,000 Units Subcutaneous 3 times per day  . hydrOXYzine  25 mg Oral TID  . levothyroxine  88 mcg Oral QAC breakfast  . ondansetron (ZOFRAN) IV  8 mg Intravenous 4 times per day  . pantoprazole  40 mg Oral Daily   Continuous Infusions: . sodium chloride 125 mL/hr at 10/08/14 1202    Principal Problem:   Rectal adenocarcinoma metastatic to liver Active Problems:   Rectal cancer   Acute renal failure (ARF)   Hyperbilirubinemia   Obstructive jaundice due to cancer   Encounter for palliative care   [facility], [facility] Pager [phone]. If 7PM-7AM, please contact night-coverage at [email or web address], password [facility] 10/08/2014, 4:03 PM  LOS: 2 days

## 2014-10-08 NOTE — Telephone Encounter (Signed)
Received vm message from pt's daughter, Mindi Curling.  She needs to speak with Dr. Alen Blew about her mother who is currently hospitalized. [name] can be reached on her cell phone  # [phone]

## 2014-10-09 MED ORDER — METOCLOPRAMIDE HCL 5 MG PO TABS
5.0000 mg | ORAL_TABLET | Freq: Three times a day (TID) | ORAL | Status: DC
  Administered 2014-10-09 – 2014-10-10 (×3): 5 mg via ORAL
  Filled 2014-10-09 (×11): qty 1

## 2014-10-09 MED ORDER — PROMETHAZINE HCL 25 MG PO TABS
25.0000 mg | ORAL_TABLET | ORAL | Status: DC | PRN
  Administered 2014-10-09: 25 mg via ORAL
  Filled 2014-10-09: qty 1

## 2014-10-09 MED ORDER — LIP MEDEX EX OINT
TOPICAL_OINTMENT | CUTANEOUS | Status: AC
  Administered 2014-10-09: 16:00:00
  Filled 2014-10-09: qty 7

## 2014-10-09 NOTE — Care Management (Signed)
Important Message  Patient Details  Name: Ashley Davenport MRN: [id number] Date of Birth: Apr 23, 1930   Medicare Important Message Given:  Yes-second notification given    Camillo Flaming 10/09/2014, 11:21 AM

## 2014-10-09 NOTE — Progress Notes (Signed)
[facility] PROGRESS NOTE  Ashley Davenport IRW:[id number] DOB: 07/14/1930 DOA: 10/05/2014 PCP: [name], MD  Brief narrative: Ashley Davenport is a 81 y.o. female with rectal cancer with liver metastasis who has been having vomiting and epigastric pain for a good 3-4 weeks now. This is caused her to be quite fatigued. She is unable to tolerate liquids or solids and has been chewing on ice. She is also had intense itching which has not improved with Zyrtec she was prescribed as outpatient.  Assessment/Plan: Rectal adenocarcinoma metastatic to liver -Pt remains off Xeloda for 4 weeks now due to side effects -Tentative plans noted to repeat scans next month to see extent of cancer -CT abdomen pelvis performed here reveals increasing cancer burden with increased number of hepatic metastasis with evidence of bilateral ductal biliary enlargement -there is a right adrenal nodule as well -palliative care has seen in consultation, likely plans for home with hospice when discharged   Obstructive jaundice due to cancer/itching/hyper bilirubinemia -Zyrtec was not helping and was discontinued -Doxepin was given which helped slightly -No improvement with trial of cholestyramine -Continue PRN Benadryl cream -Improvement noted with atarax -Discussed case with IR. Given tumor burdon, biliary stenting was not recommended. Instead, palliation was recommended. Discussed case with Dr. Alen Blew who agrees. Have updated patient and family at bedside  Vomiting -Compazine suppositories have not been helping -Zofran gtt has been continued -Pt currently remains on soft diet -Will consider transitioning to oral regimen  Acute renal failure -Consideration for ATN and prerenal renal disease -Secondary to furosemide, Cozaar and poor oral intake, lasix held -Continued on normal saline at 125 mL an hour and continue to follow renal function  Hyperkalemia - improved  Positive UA -Pt  asymptomatic -she received one dose of Rocephin by the ER  Code Status: DNR Family Communication: Pt in room, family at bedside Disposition Plan: Pending   Consultants:  IR  Oncology  Procedures:    Antibiotics:  Rocephin x 1 dose (indicate start date, and stop date if known)  HPI/Subjective: Feels hungry today. Still feels somewhat nauseated  Objective: Filed Vitals:   10/08/14 0515 10/08/14 1353 10/08/14 2105 10/09/14 0420  BP: 125/64 137/60 102/49 118/57  Pulse: 84 71 68 78  Temp: 98.1 F (36.7 C) 98.5 F (36.9 C) 98.1 F (36.7 C) 98 F (36.7 C)  TempSrc: Oral Oral Oral Oral  Resp: 20 18 20 20   Height:      Weight:      SpO2: 97% 100% 100% 99%    Intake/Output Summary (Last 24 hours) at 10/09/14 1328 Last data filed at 10/09/14 0531  Gross per 24 hour  Intake 4914.58 ml  Output      0 ml  Net 4914.58 ml   Filed Weights   10/05/14 2047 10/06/14 0129  Weight: 70.761 kg (156 lb) 74.5 kg (164 lb 3.9 oz)    Exam:   General:  Awake, in nad  Cardiovascular: regular, s1, s2  Respiratory: normal resp effort, no wheezing  Abdomen: soft,nondistended, pos BS  Musculoskeletal: perfused, no clubbing   Data Reviewed: Basic Metabolic Panel:  Recent Labs Lab 10/05/14 2141 10/06/14 0800 10/07/14 0415 10/07/14 1220  NA 133* 135 134* 137  K 4.5 4.5 5.2* 4.7  CL 104 108 110 113*  CO2 15* 15* 13* 14*  GLUCOSE 122* 115* 99 97  BUN 68* 64* 61* 64*  CREATININE 3.92* 3.78* 3.34* 3.48*  CALCIUM 8.9 8.7* 8.5* 8.5*   Liver Function Tests:  Recent  Labs Lab 10/05/14 2141 10/07/14 0415  AST 191* 199*  ALT 45 40  ALKPHOS 245* 214*  BILITOT 7.2* 7.6*  PROT 6.4* 5.1*  ALBUMIN 2.6* 2.1*   No results for input(s): LIPASE, AMYLASE in the last 168 hours. No results for input(s): AMMONIA in the last 168 hours. CBC:  Recent Labs Lab 10/05/14 2141 10/06/14 0800  WBC 11.1* 11.1*  HGB 10.1* 9.2*  HCT 30.6* 27.6*  MCV 96.5 96.5  PLT 308 318    Cardiac Enzymes:  Recent Labs Lab 10/05/14 2141  TROPONINI 0.03   BNP (last 3 results) No results for input(s): BNP in the last 8760 hours.  ProBNP (last 3 results) No results for input(s): PROBNP in the last 8760 hours.  CBG: No results for input(s): GLUCAP in the last 168 hours.  No results found for this or any previous visit (from the past 240 hour(s)).   Studies: No results found.  Scheduled Meds: . doxepin  10 mg Oral QHS  . feeding supplement (ENSURE ENLIVE)  237 mL Oral BID BM  . heparin  5,000 Units Subcutaneous 3 times per day  . hydrOXYzine  25 mg Oral TID  . levothyroxine  88 mcg Oral QAC breakfast  . ondansetron (ZOFRAN) IV  8 mg Intravenous 4 times per day  . pantoprazole  40 mg Oral Daily   Continuous Infusions: . sodium chloride 125 mL/hr at 10/09/14 0532    Principal Problem:   Rectal adenocarcinoma metastatic to liver Active Problems:   Rectal cancer   Acute renal failure (ARF)   Hyperbilirubinemia   Obstructive jaundice due to cancer   Encounter for palliative care   Biliary obstruction   [facility], [facility] Pager [phone]. If 7PM-7AM, please contact night-coverage at [email or web address], password [facility] 10/09/2014, 1:28 PM  LOS: 3 days

## 2014-10-10 DIAGNOSIS — Z515 Encounter for palliative care: Secondary | ICD-10-CM

## 2014-10-10 LAB — BASIC METABOLIC PANEL
ANION GAP: 12 (ref 5–15)
BUN: 66 mg/dL — ABNORMAL HIGH (ref 6–20)
CHLORIDE: 116 mmol/L — AB (ref 101–111)
CO2: 10 mmol/L — AB (ref 22–32)
CREATININE: 4.25 mg/dL — AB (ref 0.44–1.00)
Calcium: 9.1 mg/dL (ref 8.9–10.3)
GFR calc Af Amer: 10 mL/min — ABNORMAL LOW (ref >60)
GFR calc non Af Amer: 9 mL/min — ABNORMAL LOW (ref >60)
Glucose, Bld: 109 mg/dL — ABNORMAL HIGH (ref 65–99)
Potassium: 5 mmol/L (ref 3.5–5.1)
SODIUM: 138 mmol/L (ref 135–145)

## 2014-10-10 LAB — AMMONIA: Ammonia: 72 umol/L — ABNORMAL HIGH (ref 9–35)

## 2014-10-10 MED ORDER — ONDANSETRON HCL 4 MG PO TABS: 8.0000 mg | ORAL_TABLET | Freq: Four times a day (QID) | ORAL | Status: DC | PRN

## 2014-10-10 MED ORDER — FUROSEMIDE 10 MG/ML IJ SOLN
80.0000 mg | Freq: Once | INTRAMUSCULAR | Status: AC
  Administered 2014-10-10: 80 mg via INTRAVENOUS
  Filled 2014-10-10: qty 8

## 2014-10-10 MED ORDER — LACTULOSE 10 GM/15ML PO SOLN
20.0000 g | Freq: Three times a day (TID) | ORAL | Status: DC
  Administered 2014-10-10: 20 g via ORAL
  Filled 2014-10-10 (×9): qty 30

## 2014-10-10 NOTE — Progress Notes (Signed)
[facility] PROGRESS NOTE  DIANAH PRUETT LYY:[id number] DOB: 1930-08-22 DOA: 10/05/2014 PCP: [name], MD  Brief narrative: Ashley Davenport is a 81 y.o. female with rectal cancer with liver metastasis who has been having vomiting and epigastric pain for a good 3-4 weeks now. This is caused her to be quite fatigued. She is unable to tolerate liquids or solids and has been chewing on ice. She is also had intense itching which has not improved with Zyrtec she was prescribed as outpatient.  Assessment/Plan: Rectal adenocarcinoma metastatic to liver -Pt remains off Xeloda for 4 weeks now due to side effects -Original plans noted to repeat scans next month to see extent of cancer -CT abdomen pelvis performed here reveals increasing cancer burden with increased number of hepatic metastasis with evidence of bilateral ductal biliary enlargement -there is a right adrenal nodule as well -palliative care has seen in consultation, ultimate plans for home with hospice when discharged   Obstructive jaundice due to cancer/itching/hyper bilirubinemia -Zyrtec was not helping and was discontinued -Doxepin was given which helped slightly -No improvement with trial of cholestyramine -Continue PRN Benadryl cream -Improvement noted with atarax -Discussed case with IR. Given tumor burdon, biliary stenting was not recommended. Instead, palliation was recommended. Discussed case with Dr. Alen Blew who agrees.   Vomiting -Zofran gtt was continued initially -Reglan was started 7/7 with good results. Pt denies nausea today -Have transitioned to oral zofran -Tolerating diet  Acute renal failure -Consideration for ATN and prerenal renal disease -Lasix had been held -Continued on normal saline at 125 mL an hour with no significant improvement in renal function. Renal function worse today - Just under 300cc urine on bladder scan. Attempted foley cath placement but is unsuccessful. Have  consulted Urology for assistance - have given one dose of lasix 80mg  x 1 - Hopefully renal function improves. If renal function continues to worsen, family has confirmed NO DIALYSIS  Hyperkalemia - improved  Positive UA -Pt asymptomatic -she received one dose of Rocephin by the ER  Code Status: DNR Family Communication: Pt in room, family at bedside Disposition Plan: Pending   Consultants:  IR  Oncology  Procedures:    Antibiotics:  Rocephin x 1 dose   HPI/Subjective: Sleepy today. Denies nausea  Objective: Filed Vitals:   10/09/14 1531 10/09/14 2025 10/09/14 2325 10/10/14 0538  BP: 124/56 121/53 123/59 142/53  Pulse: 72 80 88 80  Temp: 97.6 F (36.4 C) 97.9 F (36.6 C)  97.3 F (36.3 C)  TempSrc: Oral Oral  Oral  Resp: 16 16 18 22   Height:      Weight:      SpO2: 99% 100% 97% 100%    Intake/Output Summary (Last 24 hours) at 10/10/14 1517 Last data filed at 10/10/14 1145  Gross per 24 hour  Intake     10 ml  Output      0 ml  Net     10 ml   Filed Weights   10/05/14 2047 10/06/14 0129  Weight: 70.761 kg (156 lb) 74.5 kg (164 lb 3.9 oz)    Exam:   General:  Asleep, easily arousable, in nad  Cardiovascular: regular, s1, s2  Respiratory: normal resp effort, no wheezing  Abdomen: soft,nondistended, pos BS  Musculoskeletal: perfused, no clubbing   Data Reviewed: Basic Metabolic Panel:  Recent Labs Lab 10/05/14 2141 10/06/14 0800 10/07/14 0415 10/07/14 1220 10/10/14 0525  NA 133* 135 134* 137 138  K 4.5 4.5 5.2* 4.7 5.0  CL 104  108 110 113* 116*  CO2 15* 15* 13* 14* 10*  GLUCOSE 122* 115* 99 97 109*  BUN 68* 64* 61* 64* 66*  CREATININE 3.92* 3.78* 3.34* 3.48* 4.25*  CALCIUM 8.9 8.7* 8.5* 8.5* 9.1   Liver Function Tests:  Recent Labs Lab 10/05/14 2141 10/07/14 0415  AST 191* 199*  ALT 45 40  ALKPHOS 245* 214*  BILITOT 7.2* 7.6*  PROT 6.4* 5.1*  ALBUMIN 2.6* 2.1*   No results for input(s): LIPASE, AMYLASE in the last  168 hours.  Recent Labs Lab 10/10/14 1130  AMMONIA 72*   CBC:  Recent Labs Lab 10/05/14 2141 10/06/14 0800  WBC 11.1* 11.1*  HGB 10.1* 9.2*  HCT 30.6* 27.6*  MCV 96.5 96.5  PLT 308 318   Cardiac Enzymes:  Recent Labs Lab 10/05/14 2141  TROPONINI 0.03   BNP (last 3 results) No results for input(s): BNP in the last 8760 hours.  ProBNP (last 3 results) No results for input(s): PROBNP in the last 8760 hours.  CBG: No results for input(s): GLUCAP in the last 168 hours.  No results found for this or any previous visit (from the past 240 hour(s)).   Studies: No results found.  Scheduled Meds: . doxepin  10 mg Oral QHS  . feeding supplement (ENSURE ENLIVE)  237 mL Oral BID BM  . heparin  5,000 Units Subcutaneous 3 times per day  . hydrOXYzine  25 mg Oral TID  . lactulose  20 g Oral TID  . levothyroxine  88 mcg Oral QAC breakfast  . metoCLOPramide  5 mg Oral TID AC  . pantoprazole  40 mg Oral Daily   Continuous Infusions:    Principal Problem:   Rectal adenocarcinoma metastatic to liver Active Problems:   Rectal cancer   Acute renal failure (ARF)   Hyperbilirubinemia   Obstructive jaundice due to cancer   Encounter for palliative care   Biliary obstruction   Corisa Montini, [place] [facility] Pager [phone]. If 7PM-7AM, please contact night-coverage at [email or web address], password [facility] 10/10/2014, 3:17 PM  LOS: 4 days

## 2014-10-10 NOTE — Progress Notes (Signed)
   10/09/14 2325  What Happened  Was fall witnessed? Yes  Who witnessed fall? KR Joneen Caraway RN  Patients activity before fall to/from bed, chair, or stretcher (to [facility] assisted)  Point of contact buttocks  Was patient injured? No  Follow Up  MD notified Fredirick Maudlin  Time MD notified [phone]  Family notified No- patient refusal  Additional tests No  Progress note created (see row info) Yes  Adult Fall Risk Assessment  Risk Factor Category (scoring not indicated) Fall has occurred during this admission (document High fall risk)  Age 81  Fall History: Fall within 6 months prior to admission 0  Elimination; Bowel and/or Urine Incontinence 0  Elimination; Bowel and/or Urine Urgency/Frequency 0  Medications: includes PCA/Opiates, Anti-convulsants, Anti-hypertensives, Diuretics, Hypnotics, Laxatives, Sedatives, and Psychotropics 0  Patient Care Equipment 1  Mobility-Assistance 2  Mobility-Gait 2  Mobility-Sensory Deficit 0  Cognition-Awareness 0  Cognition-Impulsiveness 0  Cognition-Limitations 0  Total Score 8  Patient's Fall Risk High Fall Risk (>13 points)  Adult Fall Risk Interventions  Required Bundle Interventions *See Row Information* High fall risk - low, moderate, and high requirements implemented  Additional Interventions Specialty bed:  Low bed  Fall with Injury Screening  Risk For Fall Injury- See Row Information  Nurse judgement  Vitals  BP (!) 123/59 mmHg  BP Location Left Arm  BP Method Automatic  Patient Position (if appropriate) Lying  Pulse Rate 88  Pulse Rate Source Dinamap  Resp 18  Oxygen Therapy  SpO2 97 %  O2 Device Room Air  Pain Assessment  Pain Assessment 0-10  Pain Score 0  Neurological  Neuro (WDL) WDL  Musculoskeletal  Musculoskeletal (WDL) X  Assistive Device BSC;Other (Comment) (using bedpan )  Generalized Weakness Yes  Weight Bearing Restrictions No  Musculoskeletal Details  RUE Limited movement  LUE Full movement  RLE Full movement  LLE Full  movement  Integumentary  Integumentary (WDL) X  Skin Color Jaundice  Skin Condition Dry;Itching  Itching intervention vistaril  Skin Integrity Intact  Skin Turgor Non-tenting

## 2014-10-10 NOTE — Progress Notes (Deleted)
See post fall note

## 2014-10-10 NOTE — Consult Note (Signed)
Urology Consult  CC: Referring physician: Dr. Wyline Copas Reason for referral: Difficult catheter placement  History of Present Illness: Ashley Davenport is a very pleasant 81 year old female who is nearing end of life secondary to metastatic rectal carcinoma. She has been in the hospital and has been receiving dieretic's but has not voided and was found to have an 300 mL in the bladder but was unable to urinate. Attempts to place a Foley catheter by the nursing staff and by the catheter team were unsuccessful. And I was contacted for Foley catheter placement. I was told by her nurse that there appeared to be some obstruction that was preventing catheter placement. Her difficulty with catheter placement would be considered a moderate severity with no modifying factors or other associated signs and symptoms. She had been voiding spontaneously. She has no significant past urologic history.  Past Medical History  Diagnosis Date  . AAA (abdominal aortic aneurysm) 2011  . Hyperlipidemia   . Hypertension   . Shingles     POST HERPATIC PAIN FROM SHINGLES  . History of acute gouty arthritis   . Kidney cysts   . Anemia     HX OF ANEMIA WITH CHEMO 2008  . Hypothyroidism   . Cancer     Rectal and liver  . Rectal cancer    Past Surgical History  Procedure Laterality Date  . Abdominal hysterectomy  1961    total  . Tonsillectomy  1949  . Bunionectomy Bilateral   . Rectal tumor removed  2008    CANCEROUS  . Abdominal aorta aneurysm repair -stent  2011  . Portacath placement N/A 09/24/2012    Procedure: INSERTION PORT-A-CATH;  Surgeon: Stark Klein, MD;  Location: [facility];  Service: General;  Laterality: N/A;  . Abdominal aortic aneurysm repair    . Hepatic/embolization arteriogram with y-90 radioembolization  10/21/13    Medications:  Scheduled: . doxepin  10 mg Oral QHS  . feeding supplement (ENSURE ENLIVE)  237 mL Oral BID BM  . heparin  5,000 Units Subcutaneous 3 times per day  . hydrOXYzine  25  mg Oral TID  . lactulose  20 g Oral TID  . levothyroxine  88 mcg Oral QAC breakfast  . metoCLOPramide  5 mg Oral TID AC  . pantoprazole  40 mg Oral Daily    Allergies:  Allergies  Allergen Reactions  . Shellfish Allergy Itching and Rash    Feels itching internally.  . Tramadol Hcl Swelling    REACTION: Tongue and lips swell  . Iodine Itching  . Meperidine Hcl Other (See Comments)    REACTION: Hypotension    Family History  Problem Relation Age of Onset  . Hypertension Mother   . Heart disease Mother   . Heart attack Mother   . Hypertension Father   . Heart disease Father   . Diabetes Father   . Heart attack Father   . Cancer Sister     ovarian  . Diabetes Sister   . Hyperlipidemia Sister   . Hypertension Sister   . Kidney disease Sister   . Cancer Cousin     breast  . Cancer Cousin     breast  . Diabetes Daughter   . Hyperlipidemia Daughter   . Hypertension Daughter     Social History:  reports that she quit smoking about 26 years ago. Her smoking use included Cigarettes. She has a 20 pack-year smoking history. She has never used smokeless tobacco. She reports that she drinks alcohol. She  reports that she does not use illicit drugs.  Review of Systems: Pertinent items are noted in HPI. A comprehensive review of systems was negative except as noted above.  Physical Exam:  Vital signs in last 24 hours: Temp:  [97.3 F (36.3 C)-97.9 F (36.6 C)] 97.4 F (36.3 C) (07/08 1518) Pulse Rate:  [72-88] 72 (07/08 1518) Resp:  [16-22] 18 (07/08 1518) BP: (121-142)/(53-59) 126/55 mmHg (07/08 1518) SpO2:  [97 %-100 %] 99 % (07/08 1518) General appearance: alert and appears stated age Head: Normocephalic, without obvious abnormality, atraumatic Eyes: conjunctivae/corneas icteric. EOM's intact.  Oropharynx: moist mucous membranes Neck: supple, symmetrical, trachea midline Resp: normal respiratory effort Cardio: regular rate and rhythm Back: symmetric, no curvature.  ROM normal. No CVA tenderness. GI: soft, non-tender; bowel sounds normal; no masses,  no organomegaly GU: She has normal external female genitalia. Extremities: extremities normal, atraumatic, no cyanosis with some edema Skin: Skin color normal. No visible rashes or lesions Neurologic: Grossly normal  Laboratory Data:  No results for input(s): WBC, HGB, HCT in the last 72 hours. BMET  Recent Labs  10/10/14 0525  NA 138  K 5.0  CL 116*  CO2 10*  GLUCOSE 109*  BUN 66*  CREATININE 4.25*  CALCIUM 9.1   No results for input(s): LABPT, INR in the last 72 hours. No results for input(s): LABURIN in the last 72 hours. Results for orders placed or performed in visit on 08/01/14  Urine Culture     Status: None   Collection Time: 08/01/14  9:07 AM  Result Value Ref Range Status   Urine Culture, Routine Culture, Urine  Final    Comment: Final - ===== COLONY COUNT: ===== 8,000 COLONIES/ML Insignificant Growth    Creatinine:  Recent Labs  10/05/14 2141 10/06/14 0800 10/07/14 0415 10/07/14 1220 10/10/14 0525  CREATININE 3.92* 3.78* 3.34* 3.48* 4.25*    Imaging: No results found.   Procedure: I discussed with the patient my planned procedure and had her move into the frog-leg position. I cleansed the genitalia with Hibiclens and placed an index finger in the vagina. A 14 French catheter was then passed blindly over the top of my finger, into the urethra and into the bladder with no resistance or difficulty whatsoever. The urine returned completely clear. The catheter was connected to closed system drainage.   Impression/Assessment:  Difficult Foley catheter placement   Plan:  Would leave Foley catheter indwelling for now.    Claybon Jabs 10/10/2014, 3:54 PM

## 2014-10-10 NOTE — Care Management Note (Signed)
Case Management Note  Patient Details  Name: Ashley Davenport MRN: [id number] Date of Birth: 07-19-1930  Subjective/Objective:          81 yo admitted with Rectal CA          Action/Plan: From home  Expected Discharge Date:   (unknown)               Expected Discharge Plan:  Home w Hospice Care  In-House Referral:     Discharge planning Services  CM Consult  Post Acute Care Choice:  Hospice Choice offered to:  Adult Children  DME Arranged:    DME Agency:     HH Arranged:  Disease Management [place] Agency:   ([facility])  Status of Service:  In process, will continue to follow  Medicare Important Message Given:  Yes-second notification given Date Medicare IM Given:    Medicare IM give by:    Date Additional Medicare IM Given:    Additional Medicare Important Message give by:     If discussed at [place] of Stay Meetings, dates discussed:    Additional Comments: Spoke with daughter [name] to offer choice for home hospice services. [name] chose [facility]. Referral given to rep. Information faxed to intake [phone]. CM will continue to follow. Lynnell Catalan, RN 10/10/2014, 3:29 PM

## 2014-10-10 NOTE — Progress Notes (Signed)
Clinical Social Work  CSW received referral for hospice. Per MD, consult for hospice at home. Patient discussed during progression meeting and CM aware of referral. CSW is signing off but available if needed.  [place], [place] [phone]

## 2014-10-11 LAB — BASIC METABOLIC PANEL
ANION GAP: 13 (ref 5–15)
BUN: 73 mg/dL — ABNORMAL HIGH (ref 6–20)
CHLORIDE: 115 mmol/L — AB (ref 101–111)
CO2: 10 mmol/L — ABNORMAL LOW (ref 22–32)
Calcium: 8.9 mg/dL (ref 8.9–10.3)
Creatinine, Ser: 4.79 mg/dL — ABNORMAL HIGH (ref 0.44–1.00)
GFR calc Af Amer: 9 mL/min — ABNORMAL LOW (ref >60)
GFR calc non Af Amer: 8 mL/min — ABNORMAL LOW (ref >60)
GLUCOSE: 109 mg/dL — AB (ref 65–99)
POTASSIUM: 5.7 mmol/L — AB (ref 3.5–5.1)
Sodium: 138 mmol/L (ref 135–145)

## 2014-10-11 NOTE — Progress Notes (Addendum)
[facility] PROGRESS NOTE  Ashley Davenport KNL:[id number] DOB: 1930-09-06 DOA: 10/05/2014 PCP: [name], MD  Brief narrative: Ashley Davenport is a 81 y.o. female with rectal cancer with liver metastasis who has been having vomiting and epigastric pain for a good 3-4 weeks now. This is caused her to be quite fatigued. She is unable to tolerate liquids or solids and has been chewing on ice. She is also had intense itching which has not improved with Zyrtec she was prescribed as outpatient.  Assessment/Plan:  Acute renal failure on CKD III -Consideration for ATN and prerenal renal disease -patient with poor urine output and progressive renal failure, continued elevation of BUN/Creatinine - Hopefully renal function improves. If renal function continues to worsen, family has confirmed NO DIALYSIS in discussions with Dr. Wyline Copas  Goals of care - patient less responsive today, opens eyes but appears spontaneous. Discussed with patient's daughter, this is different and they no longer think that she can go home and are inquiring about residential hospice. This is now appropriate and I consulted SW. I am concerned about her breathing, she now has periods of apnea, she is anuric, hyperkalemic and lethargic, she may pass in the hospital. Family understands prognosis and wish for comfort measures and "not to suffer".  - will plan for residential hospice if she survives next 24 h   Obstructive jaundice due to cancer/itching/hyper bilirubinemia -Zyrtec was not helping and was discontinued -Doxepin was given which helped slightly -No improvement with trial of cholestyramine -Continue PRN Benadryl cream -Improvement noted with atarax -Dr. Wyline Copas Discussed case with IR. Given tumor burdon, biliary stenting was not recommended. Instead, palliation was recommended. Dr. Wyline Copas Discussed case with Dr. Alen Blew who agrees.   Stage IV Rectal adenocarcinoma metastatic to liver -Pt remains off  Xeloda for 4 weeks now due to side effects -Original plans noted to repeat scans next month to see extent of cancer -CT abdomen pelvis performed here reveals increasing cancer burden with increased number of hepatic metastasis with evidence of bilateral ductal biliary enlargement -there is a right adrenal nodule as well -palliative care has seen in consultation, ultimate plans for home with hospice when discharged  Vomiting -Zofran gtt was continued initially -Reglan was started 7/7 with good results. Pt denies nausea today -Have transitioned to oral zofran -Tolerating diet  Hyperkalemia - improved  Positive UA -Pt asymptomatic -she received one dose of Rocephin by the ER  Code Status: DNR Family Communication: family at bedside Disposition Plan: Pending   Consultants:  IR  Oncology  Procedures:    Antibiotics:  Rocephin x 1 dose   HPI/Subjective: - lethargic, opens eyes intermittently, shallow breathing - not following commands  Objective: Filed Vitals:   10/10/14 0538 10/10/14 1518 10/10/14 2118 10/11/14 0700  BP: 142/53 126/55 101/76 100/61  Pulse: 80 72 79 83  Temp: 97.3 F (36.3 C) 97.4 F (36.3 C) 97.6 F (36.4 C) 97.5 F (36.4 C)  TempSrc: Oral Axillary Axillary Axillary  Resp: 22 18 18 18   Height:      Weight:      SpO2: 100% 99% 100% 99%    Intake/Output Summary (Last 24 hours) at 10/11/14 1109 Last data filed at 10/11/14 0759  Gross per 24 hour  Intake    210 ml  Output    325 ml  Net   -115 ml   Filed Weights   10/05/14 2047 10/06/14 0129  Weight: 70.761 kg (156 lb) 74.5 kg (164 lb 3.9 oz)  Exam:   General:  lethargic  Cardiovascular: regular, s1, s2  Respiratory: no wheezing  Abdomen: soft,nondistended, pos BS  Musculoskeletal: perfused, no clubbing   Data Reviewed: Basic Metabolic Panel:  Recent Labs Lab 10/06/14 0800 10/07/14 0415 10/07/14 1220 10/10/14 0525 10/11/14 0400  NA 135 134* 137 138 138  K 4.5  5.2* 4.7 5.0 5.7*  CL 108 110 113* 116* 115*  CO2 15* 13* 14* 10* 10*  GLUCOSE 115* 99 97 109* 109*  BUN 64* 61* 64* 66* 73*  CREATININE 3.78* 3.34* 3.48* 4.25* 4.79*  CALCIUM 8.7* 8.5* 8.5* 9.1 8.9   Liver Function Tests:  Recent Labs Lab 10/05/14 2141 10/07/14 0415  AST 191* 199*  ALT 45 40  ALKPHOS 245* 214*  BILITOT 7.2* 7.6*  PROT 6.4* 5.1*  ALBUMIN 2.6* 2.1*    Recent Labs Lab 10/10/14 1130  AMMONIA 72*   CBC:  Recent Labs Lab 10/05/14 2141 10/06/14 0800  WBC 11.1* 11.1*  HGB 10.1* 9.2*  HCT 30.6* 27.6*  MCV 96.5 96.5  PLT 308 318   Cardiac Enzymes:  Recent Labs Lab 10/05/14 2141  TROPONINI 0.03   Studies: No results found.  Scheduled Meds: . doxepin  10 mg Oral QHS  . feeding supplement (ENSURE ENLIVE)  237 mL Oral BID BM  . heparin  5,000 Units Subcutaneous 3 times per day  . hydrOXYzine  25 mg Oral TID  . lactulose  20 g Oral TID  . levothyroxine  88 mcg Oral QAC breakfast  . metoCLOPramide  5 mg Oral TID AC  . pantoprazole  40 mg Oral Daily   Continuous Infusions:    Principal Problem:   Rectal adenocarcinoma metastatic to liver Active Problems:   Rectal cancer   Acute renal failure (ARF)   Hyperbilirubinemia   Obstructive jaundice due to cancer   Encounter for palliative care   Biliary obstruction  Time spent: 25 minutes  [place], [place] [facility] Pager [phone]. If 7PM-7AM, please contact night-coverage at [email or web address], password [facility] 10/11/2014, 11:09 AM  LOS: 5 days

## 2014-10-11 NOTE — Clinical Social Work Note (Signed)
CSW received consult for residential hospice   CSW met with pt's daughter to discuss residential hospice services  Pt's daughter [name] stated that she wanted [facility].  Pt's daughter stated that family was ok with pt dying at the hospital and preferred this as Md thinks it could be hours to days before pt passes.  CSW referred to [facility] who stated that they did not have a bed today and would look into availability if pt is stable for transport  CSW will continue to follow pt.  Dede Query, LCSW [place] Worker - Weekend Coverage cell #: [phone]

## 2014-10-12 LAB — BASIC METABOLIC PANEL
Anion gap: 11 (ref 5–15)
BUN: 84 mg/dL — AB (ref 6–20)
CALCIUM: 8.3 mg/dL — AB (ref 8.9–10.3)
CO2: 10 mmol/L — AB (ref 22–32)
Chloride: 118 mmol/L — ABNORMAL HIGH (ref 101–111)
Creatinine, Ser: 5.61 mg/dL — ABNORMAL HIGH (ref 0.44–1.00)
GFR calc Af Amer: 7 mL/min — ABNORMAL LOW (ref >60)
GFR calc non Af Amer: 6 mL/min — ABNORMAL LOW (ref >60)
GLUCOSE: 120 mg/dL — AB (ref 65–99)
Potassium: 5.7 mmol/L — ABNORMAL HIGH (ref 3.5–5.1)
SODIUM: 139 mmol/L (ref 135–145)

## 2014-10-12 MED ORDER — DIAZEPAM 5 MG/ML IJ SOLN
2.5000 mg | Freq: Four times a day (QID) | INTRAMUSCULAR | Status: DC | PRN
  Administered 2014-10-12: 2.5 mg via INTRAVENOUS
  Filled 2014-10-12: qty 2

## 2014-10-12 MED ORDER — LORAZEPAM 2 MG/ML IJ SOLN: 0.5000 mg | INTRAMUSCULAR | Status: DC | PRN

## 2014-10-12 NOTE — Progress Notes (Signed)
[facility] PROGRESS NOTE  LEOTHA WESTERMEYER LFY:[id number] DOB: 01/09/1931 DOA: 10/05/2014 PCP: [name], MD  Brief narrative: Ashley Davenport is a 81 y.o. female with rectal cancer with liver metastasis who has been having vomiting and epigastric pain for a good 3-4 weeks now. This is caused her to be quite fatigued. She is unable to tolerate liquids or solids and has been chewing on ice. She is also had intense itching which has not improved with Zyrtec she was prescribed as outpatient.  Assessment/Plan: Rectal adenocarcinoma metastatic to liver -Pt remains off Xeloda for 4 weeks now due to side effects -Original plans noted to repeat scans next month to see extent of cancer -CT abdomen pelvis performed here reveals increasing cancer burden with increased number of hepatic metastasis with evidence of bilateral ductal biliary enlargement -there is a right adrenal nodule as well   Obstructive jaundice due to cancer/itching/hyper bilirubinemia -Zyrtec was not helping and was discontinued -Doxepin was given which helped slightly -No improvement with trial of cholestyramine -Continue PRN Benadryl cream -Improvement noted with atarax -Earlier during this admission, had discussed case with IR. Given tumor burdon, biliary stenting was not recommended. Instead, palliation was recommended. Discussed case with Dr. Alen Blew who agrees.   Vomiting -Zofran gtt was continued initially -Reglan was later started 7/7 with good results.  - Pt now encephalopathic, stable  Acute renal failure -Consideration for ATN and prerenal renal disease -Lasix had been held -Initially continued on normal saline at 125 mL an hour with no significant improvement in renal function.   - Just under 300cc urine on bladder scan. Attempted foley cath placement but is unsuccessful. Have consulted Urology for assistance - have given one dose of lasix 80mg  x 1 - Family has confirmed NO DIALYSIS - Renal  function continues to worsen  Hyperkalemia - worsening - secondary to renal failure  Positive UA -she received one dose of Rocephin by the ER  End of Life - Patient's condition is worsening - Have updated [name], pt's daughter, who requests focusing on "I just want her comfortable." - Will continue on comfort measures only - Continue on PRN morphine and PRN benzos as needed - Very grim prognosis. Plans for [facility], but no beds available at this time.  Code Status: DNR Family Communication: Pt in room, family at bedside, discussed with [name] over phone Disposition Plan: Pending   Consultants:  IR  Oncology  Procedures:    Antibiotics:  Rocephin x 1 dose   HPI/Subjective: Pt sleepy, non-verbal  Objective: Filed Vitals:   10/11/14 1447 10/11/14 2139 10/12/14 0537 10/12/14 1424  BP: 124/49 101/64 114/49 131/50  Pulse: 92 87 87 72  Temp: 97.4 F (36.3 C) 97.6 F (36.4 C) 97.5 F (36.4 C)   TempSrc: Axillary Axillary Oral   Resp: 18 18  12   Height:      Weight:      SpO2: 99%       Intake/Output Summary (Last 24 hours) at 10/12/14 1513 Last data filed at 10/11/14 2142  Gross per 24 hour  Intake      0 ml  Output     50 ml  Net    -50 ml   Filed Weights   10/05/14 2047 10/06/14 0129  Weight: 70.761 kg (156 lb) 74.5 kg (164 lb 3.9 oz)    Exam:   General:  Asleep, arousable, in nad  Cardiovascular: regular, s1, s2  Respiratory: normal resp effort, no wheezing  Abdomen: soft,nondistended  Musculoskeletal:  perfused, B LE edema  Data Reviewed: Basic Metabolic Panel:  Recent Labs Lab 10/07/14 0415 10/07/14 1220 10/10/14 0525 10/11/14 0400 10/12/14 0449  NA 134* 137 138 138 139  K 5.2* 4.7 5.0 5.7* 5.7*  CL 110 113* 116* 115* 118*  CO2 13* 14* 10* 10* 10*  GLUCOSE 99 97 109* 109* 120*  BUN 61* 64* 66* 73* 84*  CREATININE 3.34* 3.48* 4.25* 4.79* 5.61*  CALCIUM 8.5* 8.5* 9.1 8.9 8.3*   Liver Function Tests:  Recent Labs Lab  10/05/14 2141 10/07/14 0415  AST 191* 199*  ALT 45 40  ALKPHOS 245* 214*  BILITOT 7.2* 7.6*  PROT 6.4* 5.1*  ALBUMIN 2.6* 2.1*   No results for input(s): LIPASE, AMYLASE in the last 168 hours.  Recent Labs Lab 10/10/14 1130  AMMONIA 72*   CBC:  Recent Labs Lab 10/05/14 2141 10/06/14 0800  WBC 11.1* 11.1*  HGB 10.1* 9.2*  HCT 30.6* 27.6*  MCV 96.5 96.5  PLT 308 318   Cardiac Enzymes:  Recent Labs Lab 10/05/14 2141  TROPONINI 0.03   BNP (last 3 results) No results for input(s): BNP in the last 8760 hours.  ProBNP (last 3 results) No results for input(s): PROBNP in the last 8760 hours.  CBG: No results for input(s): GLUCAP in the last 168 hours.  No results found for this or any previous visit (from the past 240 hour(s)).   Studies: No results found.  Scheduled Meds:   Continuous Infusions:    Principal Problem:   Rectal adenocarcinoma metastatic to liver Active Problems:   Rectal cancer   Acute renal failure (ARF)   Hyperbilirubinemia   Obstructive jaundice due to cancer   Encounter for palliative care   Biliary obstruction   Renelle Stegenga, [facility] Pager [phone]. If 7PM-7AM, please contact night-coverage at [email or web address], password [facility] 10/12/2014, 3:13 PM  LOS: 6 days

## 2014-10-13 MED ORDER — MORPHINE SULFATE 2 MG/ML IJ SOLN: 2.0000 mg | INTRAMUSCULAR | Status: AC | PRN

## 2014-10-13 MED ORDER — ONDANSETRON HCL 4 MG/5ML PO SOLN: 4.0000 mg | Freq: Three times a day (TID) | ORAL | Status: AC | PRN

## 2014-10-13 MED ORDER — DIPHENHYDRAMINE-ZINC ACETATE 2-0.1 % EX CREA: TOPICAL_CREAM | Freq: Three times a day (TID) | CUTANEOUS | Status: AC | PRN

## 2014-10-13 MED ORDER — DIAZEPAM 5 MG/ML IJ SOLN: 2.5000 mg | Freq: Four times a day (QID) | INTRAMUSCULAR | Status: AC | PRN

## 2014-10-13 NOTE — Progress Notes (Signed)
Clinical Social Work  CSW spoke with family at bedside who confirmed that [facility] is first choice for hospice. CSW left hospice list and explained choice. CSW left a message with [facility] representative Harmon Pier) and will await to hear if they can accept patient. Family reviewing list to have alternative options in case [facility] is unavailable.  [place], [place] [phone]

## 2014-10-13 NOTE — Discharge Summary (Addendum)
Physician Discharge Summary  Ashley Davenport OJJ:[id number] DOB: 05-27-1930 DOA: 10/05/2014  PCP: Ashley Spurling, MD  Admit date: 10/05/2014 Discharge date: 10/13/2014  Time spent: 20 minutes  Recommendations for Outpatient Follow-up:  Comfort Care measures only Discharged to residential hospice Follow up as needed  Discharge Diagnoses:  Principal Problem:   Rectal adenocarcinoma metastatic to liver Active Problems:   Rectal cancer   Acute renal failure (ARF)   Hyperbilirubinemia   Obstructive jaundice due to cancer   Encounter for palliative care   Biliary obstruction   Discharge Condition: Stable  Diet recommendation: Comfort  Filed Weights   10/05/14 2047 10/06/14 0129  Weight: 70.761 kg (156 lb) 74.5 kg (164 lb 3.9 oz)    History of present illness:  Please see admit h and p from 7/4 for details. Briefly, pt presented with intractable nausea and vomiting in the setting of metastatic rectal cancer. The patient was admitted for further work up.  Hospital Course:  Rectal adenocarcinoma metastatic to liver -Pt remained off Xeloda for 4 weeks now due to side effects -Original plans were noted to repeat scans next month to see extent of cancer, however pt's goals are now for comfort care only -CT abdomen pelvis performed here revealed increasing cancer burden with increased number of hepatic metastasis with evidence of bilateral ductal biliary enlargement -there is a right adrenal nodule as well   Obstructive jaundice due to cancer/itching/hyper bilirubinemia -Zyrtec was not helping and was discontinued -Doxepin was given which helped slightly -No significant improvement with trial of cholestyramine -Continued PRN Benadryl cream -Improvement noted with atarax -Earlier during this admission, had discussed case with IR. Given tumor burdon, biliary stenting was not recommended. Instead, palliation was suggested. Discussed case with Dr. Alen Blew who agreed.    Vomiting -Zofran gtt was continued initially -Reglan was later started 7/7 with good results.  - Pt now encephalopathic  Acute renal failure -Consideration for ATN and prerenal renal disease -Lasix had been initially held -Initially continued on normal saline at 125 mL an hour with no significant improvement in renal function.  - Just under 300cc urine on bladder scan. Attempted foley cath placement but was unsuccessful. Had consulted Urology for assistance with subsequent placement of foley cath - have given one dose of lasix 80mg  x 1 with still no improvement in renal function - Family has confirmed NO DIALYSIS - Renal function continues to worsen and patient was continued on Comfort Care measures only (see below)  Hyperkalemia - worsening - secondary to renal failure  Positive UA -she received one dose of Rocephin by the ER  End of Life - Patient's condition is worsening. Anticipate prognosis is days to weeks - Have continually updated Ashley Davenport, pt's daughter, who requests focusing on "I just want her comfortable." - Will continue on comfort measures only - Continue on PRN morphine and PRN benzos as needed - Very grim prognosis. Plans for [facility]  Consultations:  Urology  Discharge Exam: Filed Vitals:   10/12/14 0537 10/12/14 1424 10/12/14 2211 10/13/14 0511  BP: 114/49 131/50 128/93 144/43  Pulse: 87 72 83 78  Temp: 97.5 F (36.4 C)  97.5 F (36.4 C) 97.8 F (36.6 C)  TempSrc: Oral  Oral Oral  Resp:  12 14 10   Height:      Weight:      SpO2:   83% 97%   General: Asleep, appears comfortable, in nad Cardiovascular: regular, s1, s2 Respiratory: normal resp effort  Discharge Instructions     Medication  List    STOP taking these medications        ANTI-ITCH cream  Generic drug:  diphenhydrAMINE-zinc acetate  Replaced by:  diphenhydrAMINE-zinc acetate cream     cetirizine 10 MG tablet  Commonly known as:  ZYRTEC     furosemide 20 MG tablet   Commonly known as:  LASIX     irbesartan 300 MG tablet  Commonly known as:  AVAPRO     levothyroxine 88 MCG tablet  Commonly known as:  SYNTHROID, LEVOTHROID     lidocaine-prilocaine cream  Commonly known as:  EMLA     NONFORMULARY OR COMPOUNDED ITEM     omeprazole 40 MG capsule  Commonly known as:  PRILOSEC     polyvinyl alcohol 1.4 % ophthalmic solution  Commonly known as:  LIQUIFILM TEARS     prochlorperazine 25 MG suppository  Commonly known as:  COMPAZINE      TAKE these medications        diazepam 5 MG/ML injection  Commonly known as:  VALIUM  Inject 0.5 mLs (2.5 mg total) into the vein every 6 (six) hours as needed.     diphenhydrAMINE-zinc acetate cream  Commonly known as:  BENADRYL  Apply topically 3 (three) times daily as needed for itching. Apply to affected itchy areas as needed     morphine 2 MG/ML injection  Inject 1-2 mLs (2-4 mg total) into the vein every 4 (four) hours as needed.     ondansetron 4 MG/5ML solution  Commonly known as:  ZOFRAN  Take 5 mLs (4 mg total) by mouth every 8 (eight) hours as needed for nausea or vomiting.       Allergies  Allergen Reactions  . Shellfish Allergy Itching and Rash    Feels itching internally.  . Tramadol Hcl Swelling    REACTION: Tongue and lips swell  . Iodine Itching  . Meperidine Hcl Other (See Comments)    REACTION: Hypotension   Follow-up Information    Follow up with Ashley Spurling, MD.   Specialty:  Internal Medicine   Why:  As needed   Contact information:   [address] Ashley [place]en[place] [address] [phone]        The results of significant diagnostics from this hospitalization (including imaging, microbiology, ancillary and laboratory) are listed below for reference.    Significant Diagnostic Studies: Ct Abdomen Pelvis Wo Contrast  10/06/2014   CLINICAL DATA:  Generalized weakness with pruritus and emesis for 3 days.  EXAM: CT ABDOMEN AND PELVIS WITHOUT CONTRAST   TECHNIQUE: Multidetector CT imaging of the abdomen and pelvis was performed following the standard protocol without IV contrast.  COMPARISON:  PET-CT 05/30/2014  FINDINGS: BODY WALL: No contributory findings.  LOWER CHEST: Bilateral pulmonary nodules have increased in size, compatible with metastatic disease. The largest on the right measures 14 mm in the lower lobe on image 10. No pleural fluid.  ABDOMEN/PELVIS:  Liver: Increased number of hepatic metastases. The majority of the hepatic parenchyma is replaced by tumor. Treatment related parenchymal density in the central liver is stable. Prominent umbilical vein, potentially recannulized and representing chronic compromise of the portal venous system.  Biliary: Bilateral intermittent intraductal biliary enlargement, presumably central obstruction from the metastases.  Pancreas: No acute findings.  Spleen: Unremarkable.  Adrenals: 17 mm nodule in the right adrenal gland, new from prior and consistent with metastatic disease.  Kidneys and ureters: No hydronephrosis or stone. Bilateral renal cysts, including a hemorrhagic cyst on the right.  Bladder: Unremarkable.  Reproductive: Hysterectomy and probable oophorectomies.  Bowel: History of rectal cancer with no evidence of enlarging rectal mass. Extensive colonic diverticulosis without active inflammation. No appendicitis. No bowel obstruction or inflammatory wall thickening.  Retroperitoneum: New adenopathy in the upper abdominal ligaments, including porta hepatis, lower paraesophageal and gastrohepatic ligament. Gastrohepatic ligament nodes measure up to 16 mm short axis. The portacaval node measures 19 mm in short axis.  Peritoneum: Increase and pelvic ascites which is low-density and non loculated. No discrete peritoneal metastasis.  Vascular: Aortic aneurysm status post aorto bi-iliac stent grafting. No acute findings.  OSSEOUS: No acute finding or metastatic disease.  IMPRESSION: 1. Progression of pulmonary,  hepatic, adrenal, and retroperitoneal nodal metastatic disease. 2. Scattered intrahepatic biliary duct dilatation/obstruction from #1. 3. Small but increased ascites without discrete peritoneal nodule.   Electronically Signed   By: Monte Fantasia M.D.   On: 10/06/2014 01:06   Dg Chest 2 View  10/05/2014   CLINICAL DATA:  Acute onset of generalized weakness, rash and vomiting. Current history of stage IV rectal cancer with metastases. Right shoulder pain for 1 week. Initial encounter.  EXAM: CHEST  2 VIEW  COMPARISON:  PET/CT performed 05/30/2014, and chest radiograph performed 09/24/2012  FINDINGS: The lungs are well-aerated. Mild bibasilar atelectasis is noted. There is no evidence of focal opacification, pleural effusion or pneumothorax.  The heart is normal in size; the mediastinal contour is within normal limits. No acute osseous abnormalities are seen. A right-sided chest port is noted ending about the distal SVC. An IVC filter is partially imaged.  IMPRESSION: Mild bibasilar atelectasis noted; lungs otherwise clear.   Electronically Signed   By: Garald Balding M.D.   On: 10/05/2014 22:34    Microbiology: No results found for this or any previous visit (from the past 240 hour(s)).   Labs: Basic Metabolic Panel:  Recent Labs Lab 10/07/14 0415 10/07/14 1220 10/10/14 0525 10/11/14 0400 10/12/14 0449  NA 134* 137 138 138 139  K 5.2* 4.7 5.0 5.7* 5.7*  CL 110 113* 116* 115* 118*  CO2 13* 14* 10* 10* 10*  GLUCOSE 99 97 109* 109* 120*  BUN 61* 64* 66* 73* 84*  CREATININE 3.34* 3.48* 4.25* 4.79* 5.61*  CALCIUM 8.5* 8.5* 9.1 8.9 8.3*   Liver Function Tests:  Recent Labs Lab 10/07/14 0415  AST 199*  ALT 40  ALKPHOS 214*  BILITOT 7.6*  PROT 5.1*  ALBUMIN 2.1*   No results for input(s): LIPASE, AMYLASE in the last 168 hours.  Recent Labs Lab 10/10/14 1130  AMMONIA 72*   CBC: No results for input(s): WBC, NEUTROABS, HGB, HCT, MCV, PLT in the last 168 hours. Cardiac  Enzymes: No results for input(s): CKTOTAL, CKMB, CKMBINDEX, TROPONINI in the last 168 hours. BNP: BNP (last 3 results) No results for input(s): BNP in the last 8760 hours.  ProBNP (last 3 results) No results for input(s): PROBNP in the last 8760 hours.  CBG: No results for input(s): GLUCAP in the last 168 hours.   Signed:  Kamorah Nevils K  [facility] 10/13/2014, 12:20 PM

## 2014-10-13 NOTE — Progress Notes (Signed)
Called report to Jeral Pinch, nurse at [facility]. Callback number left.

## 2014-10-13 NOTE — Plan of Care (Signed)
Problem: Phase I Progression Outcomes Goal: Oral assessment and care per protocol Outcome: Completed/Met Date Met:  10/13/14 Patient's family using swabs to keep patient's mouth moist. Goal: Respirations unlabored Outcome: Not Applicable Date Met:  10/13/14 End of life      

## 2014-10-13 NOTE — Plan of Care (Signed)
Problem: Phase II Progression Outcomes Goal: Progress activity as tolerated unless otherwise ordered Outcome: Not Applicable Date Met:  15/37/94 Endo of life care Goal: Obtain order to discontinue catheter if appropriate Outcome: Not Applicable Date Met:  32/76/14 End of life care

## 2014-10-13 NOTE — Plan of Care (Signed)
Problem: Phase III Progression Outcomes Goal: IV/normal saline lock discontinued Outcome: Not Applicable Date Met:  10/13/14 Chest port     

## 2014-10-13 NOTE — Clinical Social Work Note (Signed)
Clinical Social Work Assessment  Patient Details  Name: Ashley Davenport MRN: [id number] Date of Birth: 08/13/1930  Date of referral:  10/13/14               Reason for consult:  Discharge Planning                Permission sought to share information with:  Family Supports Permission granted to share information::  Yes, Verbal Permission Granted  Name::     Wellsite geologist::     Relationship::  dtr  Contact Information:     Housing/Transportation Living arrangements for the past 2 months:  Single Family Home Source of Information:  Adult Children Patient Interpreter Needed:  None Criminal Activity/Legal Involvement Pertinent to Current Situation/Hospitalization:  No - Comment as needed Significant Relationships:  Adult Children Lives with:  Self Do you feel safe going back to the place where you live?  No Need for family participation in patient care:  Yes (Comment)  Care giving concerns:  Family wanted patient to go home with hospice but now feel they cannot manage her needs.   Social Worker assessment / plan:  CSW received referral to assist with DC planning. Weekend CSW assessed patient and offered hospice choice. CSW followed up with family with introduce myself and explain role. CSW spoke with [facility] who is agreeable to accept patient today. CSW prepared DC packet with PTAR forms, DNR, and DC summary included. RN to call report.  CSW updated family on DC plans and PTAR arranged for 3pm. PTAR #: [id number].  CSW is signing off but available if needed.  Employment status:  Retired Forensic scientist:  Medicare PT Recommendations:  No Follow Up Information / Referral to community resources:  Other (Comment Required) (Hospice choice)  Patient/Family's Response to care:  Family engaged and appreciative of CSW assistance.  Patient/Family's Understanding of and Emotional Response to Diagnosis, Current Treatment, and Prognosis:  Family reports they wanted patient to  recover but now understand that prognosis is poor. Family reports they are glad that they are here to be with patient and want her to be comfortable.  Emotional Assessment Appearance:  Appears stated age Attitude/Demeanor/Rapport:  Lethargic, Sedated Affect (typically observed):  Unable to Assess Orientation:  Oriented to Self Alcohol / Substance use:  Not Applicable Psych involvement (Current and /or in the community):  No (Comment)  Discharge Needs  Concerns to be addressed:  No discharge needs identified Readmission within the last 30 days:  No Current discharge risk:  None Barriers to Discharge:  No Barriers Identified   Boone Master, [place] 10/13/2014, 2:23 PM

## 2014-10-27 ENCOUNTER — Ambulatory Visit: Payer: Medicare Other | Admitting: Gynecology

## 2014-11-03 DEATH — deceased

## 2014-11-04 ENCOUNTER — Ambulatory Visit (HOSPITAL_COMMUNITY): Admission: RE | Admit: 2014-11-04 | Payer: Medicare Other | Source: Ambulatory Visit

## 2014-11-11 ENCOUNTER — Other Ambulatory Visit: Payer: Medicare Other

## 2014-11-12 ENCOUNTER — Ambulatory Visit: Payer: Medicare Other | Admitting: Oncology

## 2014-12-03 IMAGING — CT NM PET TUM IMG RESTAG (PS) SKULL BASE T - THIGH
1 of 7 series · 1 of 25 positions shown · non-contrast
Comparison: 09/21/12

CLINICAL DATA: Subsequent treatment strategy for rectal carcinoma.

EXAM:
NUCLEAR MEDICINE PET SKULL BASE TO THIGH
TECHNIQUE: 8.6 mCi F-18 FDG was injected intravenously. Full-ring PET imaging
was performed from the skull base to thigh after the radiotracer. CT
data was obtained and used for attenuation correction and anatomic
localization.
FASTING BLOOD GLUCOSE:  Value: 110 mg/dl

[Series 4: ct sk_thigh 5.0 b31f · axial · 5.0mm · 0.83mm/px · 1 of 239 slices shown]
[im 239/239  brain]
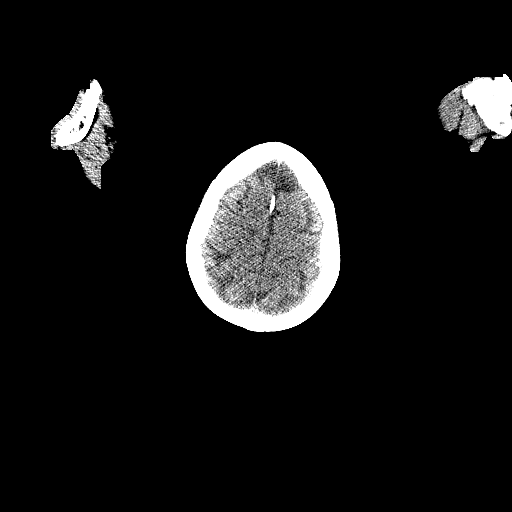

[1 of 25 positions shown; findings below may reference images not displayed]

FINDINGS: NECK

No hypermetabolic lymph nodes in the neck.

CHEST

No hypermetabolic mediastinal or hilar nodes. No suspicious
pulmonary nodules on the CT scan.

ABDOMEN/PELVIS

Dominant hepatic metastasis centered in the medial segment left lobe
shows interval decrease in size and hypermetabolic activity since
previous study. This now has an SUV max of 19.0 compared to
previously.

A small hypermetabolic metastasis in the dome of the right hepatic
lobe shows mild increase in size and hypermetabolic activity
compared to prior exam. This now has an SUV max of 12.4 compared to
7.5 previously. No new hypermetabolic liver metastases identified.

No hypermetabolic lymph nodes in the abdomen or pelvis. Stable
appearance of stent graft repair of abdominal aortic aneurysm.

SKELETON

No focal hypermetabolic activity to suggest skeletal metastasis.
IMPRESSION: Mixed response with improvement in dominant liver metastasis in the
left hepatic lobe, and mild increase in smaller right hepatic lobe
metastasis compared to prior exam.

No other sites of metastatic disease identified.

## 2015-01-05 ENCOUNTER — Other Ambulatory Visit (HOSPITAL_COMMUNITY): Payer: Medicare Other

## 2015-01-05 ENCOUNTER — Ambulatory Visit: Payer: Medicare Other | Admitting: Family

## 2015-01-28 ENCOUNTER — Other Ambulatory Visit (HOSPITAL_COMMUNITY): Payer: Medicare Other

## 2015-01-28 ENCOUNTER — Ambulatory Visit: Payer: Medicare Other | Admitting: Family
